# Patient Record
Sex: Male | Born: 1935 | Race: White | Hispanic: No | Marital: Married | Smoking: Former smoker
Health system: Southern US, Community
[De-identification: ages and names within clinical notes are randomized; demographics above are authoritative.]

## PROBLEM LIST (undated history)

## (undated) DIAGNOSIS — E785 Hyperlipidemia, unspecified: Secondary | ICD-10-CM

## (undated) DIAGNOSIS — M1990 Unspecified osteoarthritis, unspecified site: Secondary | ICD-10-CM

## (undated) DIAGNOSIS — I10 Essential (primary) hypertension: Secondary | ICD-10-CM

## (undated) DIAGNOSIS — L4050 Arthropathic psoriasis, unspecified: Secondary | ICD-10-CM

## (undated) DIAGNOSIS — I4891 Unspecified atrial fibrillation: Secondary | ICD-10-CM

## (undated) DIAGNOSIS — Z8719 Personal history of other diseases of the digestive system: Secondary | ICD-10-CM

## (undated) DIAGNOSIS — H269 Unspecified cataract: Secondary | ICD-10-CM

## (undated) DIAGNOSIS — T4145XA Adverse effect of unspecified anesthetic, initial encounter: Secondary | ICD-10-CM

## (undated) DIAGNOSIS — H409 Unspecified glaucoma: Secondary | ICD-10-CM

## (undated) DIAGNOSIS — J180 Bronchopneumonia, unspecified organism: Secondary | ICD-10-CM

## (undated) DIAGNOSIS — Z95810 Presence of automatic (implantable) cardiac defibrillator: Secondary | ICD-10-CM

## (undated) DIAGNOSIS — I495 Sick sinus syndrome: Secondary | ICD-10-CM

## (undated) DIAGNOSIS — K922 Gastrointestinal hemorrhage, unspecified: Secondary | ICD-10-CM

## (undated) DIAGNOSIS — K219 Gastro-esophageal reflux disease without esophagitis: Secondary | ICD-10-CM

## (undated) DIAGNOSIS — D509 Iron deficiency anemia, unspecified: Secondary | ICD-10-CM

## (undated) HISTORY — PX: TOTAL HIP ARTHROPLASTY: SHX124

## (undated) HISTORY — DX: Unspecified atrial fibrillation: I48.91

## (undated) HISTORY — DX: Iron deficiency anemia, unspecified: D50.9

## (undated) HISTORY — DX: Gastro-esophageal reflux disease without esophagitis: K21.9

## (undated) HISTORY — DX: Sick sinus syndrome: I49.5

## (undated) HISTORY — DX: Unspecified glaucoma: H40.9

## (undated) HISTORY — DX: Gastrointestinal hemorrhage, unspecified: K92.2

## (undated) HISTORY — PX: JOINT REPLACEMENT: SHX530

## (undated) HISTORY — PX: APPENDECTOMY: SHX54

## (undated) HISTORY — DX: Arthropathic psoriasis, unspecified: L40.50

## (undated) HISTORY — DX: Essential (primary) hypertension: I10

## (undated) HISTORY — DX: Unspecified cataract: H26.9

## (undated) HISTORY — PX: EYE SURGERY: SHX253

## (undated) HISTORY — DX: Hyperlipidemia, unspecified: E78.5

---

## 1959-05-01 HISTORY — PX: PILONIDAL CYST EXCISION: SHX744

## 1974-04-30 DIAGNOSIS — T8859XA Other complications of anesthesia, initial encounter: Secondary | ICD-10-CM

## 1974-04-30 HISTORY — DX: Other complications of anesthesia, initial encounter: T88.59XA

## 1976-04-30 HISTORY — PX: HAND SURGERY: SHX662

## 1999-10-18 ENCOUNTER — Encounter: Payer: Self-pay | Admitting: Orthopedic Surgery

## 1999-10-18 ENCOUNTER — Ambulatory Visit (HOSPITAL_COMMUNITY): Admission: RE | Admit: 1999-10-18 | Discharge: 1999-10-18 | Payer: Self-pay | Admitting: Orthopedic Surgery

## 2000-01-03 ENCOUNTER — Ambulatory Visit (HOSPITAL_COMMUNITY): Admission: RE | Admit: 2000-01-03 | Discharge: 2000-01-03 | Payer: Self-pay | Admitting: Orthopedic Surgery

## 2000-01-03 ENCOUNTER — Encounter: Payer: Self-pay | Admitting: Orthopedic Surgery

## 2000-02-29 ENCOUNTER — Encounter: Payer: Self-pay | Admitting: Orthopedic Surgery

## 2000-03-04 ENCOUNTER — Inpatient Hospital Stay (HOSPITAL_COMMUNITY): Admission: RE | Admit: 2000-03-04 | Discharge: 2000-03-07 | Payer: Self-pay | Admitting: Orthopedic Surgery

## 2000-03-04 ENCOUNTER — Encounter: Payer: Self-pay | Admitting: Orthopedic Surgery

## 2000-03-05 ENCOUNTER — Encounter: Payer: Self-pay | Admitting: Orthopedic Surgery

## 2000-12-28 ENCOUNTER — Emergency Department (HOSPITAL_COMMUNITY): Admission: EM | Admit: 2000-12-28 | Discharge: 2000-12-28 | Payer: Self-pay | Admitting: Emergency Medicine

## 2005-04-18 ENCOUNTER — Ambulatory Visit: Payer: Self-pay | Admitting: Gastroenterology

## 2005-05-07 ENCOUNTER — Ambulatory Visit: Payer: Self-pay | Admitting: Gastroenterology

## 2006-06-11 ENCOUNTER — Encounter: Admission: RE | Admit: 2006-06-11 | Discharge: 2006-06-11 | Payer: Self-pay | Admitting: Family Medicine

## 2006-06-12 ENCOUNTER — Encounter: Admission: RE | Admit: 2006-06-12 | Discharge: 2006-06-12 | Payer: Self-pay | Admitting: Family Medicine

## 2008-04-30 HISTORY — PX: INSERT / REPLACE / REMOVE PACEMAKER: SUR710

## 2008-08-23 LAB — PULMONARY FUNCTION TEST

## 2008-09-01 ENCOUNTER — Ambulatory Visit: Payer: Self-pay | Admitting: Internal Medicine

## 2008-09-01 DIAGNOSIS — L4050 Arthropathic psoriasis, unspecified: Secondary | ICD-10-CM | POA: Insufficient documentation

## 2008-09-01 DIAGNOSIS — I10 Essential (primary) hypertension: Secondary | ICD-10-CM | POA: Insufficient documentation

## 2008-09-03 ENCOUNTER — Ambulatory Visit: Payer: Self-pay

## 2008-09-03 ENCOUNTER — Encounter: Payer: Self-pay | Admitting: Internal Medicine

## 2008-09-07 ENCOUNTER — Ambulatory Visit: Payer: Self-pay

## 2008-09-07 ENCOUNTER — Ambulatory Visit: Payer: Self-pay | Admitting: Internal Medicine

## 2008-09-08 ENCOUNTER — Encounter (INDEPENDENT_AMBULATORY_CARE_PROVIDER_SITE_OTHER): Payer: Self-pay | Admitting: *Deleted

## 2008-09-08 ENCOUNTER — Encounter: Payer: Self-pay | Admitting: Cardiology

## 2008-09-08 ENCOUNTER — Telehealth: Payer: Self-pay | Admitting: Internal Medicine

## 2008-09-08 ENCOUNTER — Ambulatory Visit: Payer: Self-pay | Admitting: Internal Medicine

## 2008-09-08 LAB — CONVERTED CEMR LAB
Eosinophils Relative: 2.6 % (ref 0.0–5.0)
GFR calc non Af Amer: 101.4 mL/min (ref >60)
HCT: 46.7 % (ref 39.0–52.0)
Hemoglobin: 16 g/dL (ref 13.0–17.0)
Lymphocytes Relative: 18.2 % (ref 12.0–46.0)
Lymphs Abs: 1.2 10*3/uL (ref 0.7–4.0)
Magnesium: 2.2 mg/dL (ref 1.5–2.5)
Monocytes Relative: 8.5 % (ref 3.0–12.0)
Neutro Abs: 4.6 10*3/uL (ref 1.4–7.7)
Platelets: 212 10*3/uL (ref 150.0–400.0)
Potassium: 3.9 meq/L (ref 3.5–5.1)
Prothrombin Time: 11.4 s (ref 10.9–13.3)
Sodium: 143 meq/L (ref 135–145)
WBC: 6.8 10*3/uL (ref 4.5–10.5)
aPTT: 33.3 s — ABNORMAL HIGH (ref 21.7–28.8)

## 2008-09-10 ENCOUNTER — Inpatient Hospital Stay (HOSPITAL_BASED_OUTPATIENT_CLINIC_OR_DEPARTMENT_OTHER): Admission: RE | Admit: 2008-09-10 | Discharge: 2008-09-10 | Payer: Self-pay | Admitting: Cardiovascular Disease

## 2008-09-10 ENCOUNTER — Ambulatory Visit: Payer: Self-pay | Admitting: Cardiovascular Disease

## 2008-09-10 HISTORY — PX: CARDIAC CATHETERIZATION: SHX172

## 2008-09-13 ENCOUNTER — Ambulatory Visit: Payer: Self-pay | Admitting: Cardiovascular Disease

## 2008-09-15 ENCOUNTER — Inpatient Hospital Stay (HOSPITAL_COMMUNITY): Admission: RE | Admit: 2008-09-15 | Discharge: 2008-09-18 | Payer: Self-pay | Admitting: Cardiology

## 2008-09-15 ENCOUNTER — Encounter: Payer: Self-pay | Admitting: Cardiology

## 2008-09-15 ENCOUNTER — Ambulatory Visit: Payer: Self-pay | Admitting: Cardiology

## 2008-09-16 ENCOUNTER — Telehealth: Payer: Self-pay | Admitting: Internal Medicine

## 2008-09-18 ENCOUNTER — Encounter: Payer: Self-pay | Admitting: Cardiology

## 2008-09-20 ENCOUNTER — Encounter: Payer: Self-pay | Admitting: Internal Medicine

## 2008-09-22 ENCOUNTER — Ambulatory Visit: Payer: Self-pay | Admitting: Internal Medicine

## 2008-09-29 ENCOUNTER — Telehealth: Payer: Self-pay | Admitting: Internal Medicine

## 2008-09-29 ENCOUNTER — Encounter: Payer: Self-pay | Admitting: *Deleted

## 2008-09-30 ENCOUNTER — Ambulatory Visit: Payer: Self-pay | Admitting: Internal Medicine

## 2008-09-30 LAB — CONVERTED CEMR LAB: Protime: 18.7

## 2008-10-01 ENCOUNTER — Telehealth: Payer: Self-pay | Admitting: Internal Medicine

## 2008-10-01 LAB — CONVERTED CEMR LAB
BUN: 22 mg/dL (ref 6–23)
Calcium: 9.1 mg/dL (ref 8.4–10.5)
GFR calc non Af Amer: 70.2 mL/min (ref >60)
Potassium: 3.4 meq/L — ABNORMAL LOW (ref 3.5–5.1)

## 2008-10-05 ENCOUNTER — Ambulatory Visit: Payer: Self-pay | Admitting: Cardiology

## 2008-10-06 ENCOUNTER — Telehealth (INDEPENDENT_AMBULATORY_CARE_PROVIDER_SITE_OTHER): Payer: Self-pay | Admitting: *Deleted

## 2008-10-07 LAB — CONVERTED CEMR LAB
CO2: 29 meq/L (ref 19–32)
Calcium: 9.1 mg/dL (ref 8.4–10.5)
GFR calc non Af Amer: 88.49 mL/min (ref >60)
Pro B Natriuretic peptide (BNP): 86 pg/mL (ref 0.0–100.0)
Sodium: 143 meq/L (ref 135–145)

## 2008-10-21 ENCOUNTER — Ambulatory Visit: Payer: Self-pay | Admitting: Cardiology

## 2008-10-21 LAB — CONVERTED CEMR LAB
POC INR: 1.9
Prothrombin Time: 16.8 s

## 2008-10-26 ENCOUNTER — Ambulatory Visit: Payer: Self-pay | Admitting: Internal Medicine

## 2008-10-27 ENCOUNTER — Encounter: Payer: Self-pay | Admitting: Internal Medicine

## 2008-10-27 ENCOUNTER — Ambulatory Visit: Payer: Self-pay

## 2008-11-03 ENCOUNTER — Encounter: Payer: Self-pay | Admitting: *Deleted

## 2008-11-18 ENCOUNTER — Ambulatory Visit: Payer: Self-pay | Admitting: Cardiology

## 2008-11-18 LAB — CONVERTED CEMR LAB

## 2008-12-01 ENCOUNTER — Ambulatory Visit: Payer: Self-pay | Admitting: Internal Medicine

## 2008-12-02 ENCOUNTER — Ambulatory Visit: Payer: Self-pay | Admitting: Cardiology

## 2008-12-23 ENCOUNTER — Ambulatory Visit: Payer: Self-pay | Admitting: Cardiovascular Disease

## 2008-12-23 LAB — CONVERTED CEMR LAB: POC INR: 1.7

## 2009-01-06 ENCOUNTER — Ambulatory Visit: Payer: Self-pay | Admitting: Cardiology

## 2009-01-06 LAB — CONVERTED CEMR LAB: POC INR: 2.3

## 2009-01-26 ENCOUNTER — Ambulatory Visit: Payer: Self-pay

## 2009-01-26 ENCOUNTER — Encounter (INDEPENDENT_AMBULATORY_CARE_PROVIDER_SITE_OTHER): Payer: Self-pay | Admitting: *Deleted

## 2009-01-26 ENCOUNTER — Ambulatory Visit: Payer: Self-pay | Admitting: Internal Medicine

## 2009-01-26 ENCOUNTER — Encounter: Payer: Self-pay | Admitting: Internal Medicine

## 2009-02-03 ENCOUNTER — Ambulatory Visit: Payer: Self-pay | Admitting: Cardiology

## 2009-02-03 LAB — CONVERTED CEMR LAB: POC INR: 2.2

## 2009-03-03 ENCOUNTER — Ambulatory Visit: Payer: Self-pay | Admitting: Internal Medicine

## 2009-03-03 LAB — CONVERTED CEMR LAB: POC INR: 2

## 2009-03-23 ENCOUNTER — Ambulatory Visit: Payer: Self-pay | Admitting: Cardiology

## 2009-03-23 LAB — CONVERTED CEMR LAB: POC INR: 1.7

## 2009-04-13 ENCOUNTER — Ambulatory Visit: Payer: Self-pay | Admitting: Internal Medicine

## 2009-04-13 LAB — CONVERTED CEMR LAB: POC INR: 1.8

## 2009-05-04 ENCOUNTER — Ambulatory Visit: Payer: Self-pay | Admitting: Cardiology

## 2009-05-25 ENCOUNTER — Ambulatory Visit: Payer: Self-pay | Admitting: Cardiology

## 2009-06-15 ENCOUNTER — Ambulatory Visit: Payer: Self-pay | Admitting: Cardiology

## 2009-06-15 LAB — CONVERTED CEMR LAB: POC INR: 2.1

## 2009-07-13 ENCOUNTER — Ambulatory Visit: Payer: Self-pay | Admitting: Cardiology

## 2009-08-02 ENCOUNTER — Encounter: Payer: Self-pay | Admitting: Internal Medicine

## 2009-08-02 ENCOUNTER — Ambulatory Visit: Payer: Self-pay

## 2009-08-02 ENCOUNTER — Ambulatory Visit (HOSPITAL_COMMUNITY): Admission: RE | Admit: 2009-08-02 | Discharge: 2009-08-02 | Payer: Self-pay | Admitting: Internal Medicine

## 2009-08-02 ENCOUNTER — Ambulatory Visit: Payer: Self-pay | Admitting: Cardiology

## 2009-08-08 ENCOUNTER — Telehealth: Payer: Self-pay | Admitting: Internal Medicine

## 2009-08-10 ENCOUNTER — Ambulatory Visit: Payer: Self-pay | Admitting: Cardiovascular Disease

## 2009-08-24 ENCOUNTER — Encounter: Payer: Self-pay | Admitting: Internal Medicine

## 2009-08-24 LAB — PULMONARY FUNCTION TEST

## 2009-08-30 ENCOUNTER — Ambulatory Visit: Payer: Self-pay | Admitting: Internal Medicine

## 2009-09-01 ENCOUNTER — Encounter: Payer: Self-pay | Admitting: Internal Medicine

## 2009-09-06 ENCOUNTER — Encounter: Payer: Self-pay | Admitting: Internal Medicine

## 2009-09-07 ENCOUNTER — Ambulatory Visit: Payer: Self-pay | Admitting: Cardiology

## 2009-10-05 ENCOUNTER — Ambulatory Visit: Payer: Self-pay | Admitting: Cardiovascular Disease

## 2009-10-05 LAB — CONVERTED CEMR LAB: POC INR: 2.9

## 2009-11-02 ENCOUNTER — Ambulatory Visit: Payer: Self-pay | Admitting: Cardiology

## 2009-11-02 LAB — CONVERTED CEMR LAB: POC INR: 3.3

## 2009-11-23 ENCOUNTER — Ambulatory Visit: Payer: Self-pay | Admitting: Cardiology

## 2009-11-23 LAB — CONVERTED CEMR LAB: POC INR: 2.8

## 2009-12-21 ENCOUNTER — Ambulatory Visit: Payer: Self-pay | Admitting: Cardiology

## 2009-12-21 LAB — CONVERTED CEMR LAB: POC INR: 2.5

## 2010-01-18 ENCOUNTER — Ambulatory Visit: Payer: Self-pay | Admitting: Cardiology

## 2010-01-18 LAB — CONVERTED CEMR LAB: POC INR: 2.8

## 2010-02-15 ENCOUNTER — Ambulatory Visit: Payer: Self-pay | Admitting: Cardiology

## 2010-02-15 LAB — CONVERTED CEMR LAB: POC INR: 2.1

## 2010-03-01 ENCOUNTER — Encounter: Payer: Self-pay | Admitting: Internal Medicine

## 2010-03-01 ENCOUNTER — Ambulatory Visit: Payer: Self-pay

## 2010-03-02 ENCOUNTER — Telehealth (INDEPENDENT_AMBULATORY_CARE_PROVIDER_SITE_OTHER): Payer: Self-pay | Admitting: *Deleted

## 2010-03-06 ENCOUNTER — Ambulatory Visit: Payer: Self-pay | Admitting: Cardiovascular Disease

## 2010-04-04 ENCOUNTER — Ambulatory Visit: Payer: Self-pay | Admitting: Cardiology

## 2010-05-02 ENCOUNTER — Ambulatory Visit: Admission: RE | Admit: 2010-05-02 | Discharge: 2010-05-02 | Payer: Self-pay | Source: Home / Self Care

## 2010-05-02 LAB — CONVERTED CEMR LAB: POC INR: 1.9

## 2010-05-18 ENCOUNTER — Encounter: Payer: Self-pay | Admitting: Gastroenterology

## 2010-05-25 ENCOUNTER — Inpatient Hospital Stay (HOSPITAL_COMMUNITY)
Admission: EM | Admit: 2010-05-25 | Discharge: 2010-05-28 | Payer: Self-pay | Source: Home / Self Care | Attending: Internal Medicine | Admitting: Internal Medicine

## 2010-05-25 DIAGNOSIS — K5731 Diverticulosis of large intestine without perforation or abscess with bleeding: Secondary | ICD-10-CM

## 2010-05-25 DIAGNOSIS — D684 Acquired coagulation factor deficiency: Secondary | ICD-10-CM

## 2010-05-25 DIAGNOSIS — I4891 Unspecified atrial fibrillation: Secondary | ICD-10-CM

## 2010-05-25 DIAGNOSIS — R578 Other shock: Secondary | ICD-10-CM

## 2010-05-25 LAB — CBC
HCT: 34.3 % — ABNORMAL LOW (ref 39.0–52.0)
HCT: 26.4 % — ABNORMAL LOW (ref 39.0–52.0)
HCT: 18.8 % — ABNORMAL LOW (ref 39.0–52.0)
Hemoglobin: 11.8 g/dL — ABNORMAL LOW (ref 13.0–17.0)
Hemoglobin: 6.4 g/dL — CL (ref 13.0–17.0)
MCH: 31.9 pg (ref 26.0–34.0)
MCH: 31.2 pg (ref 26.0–34.0)
MCHC: 34.4 g/dL (ref 30.0–36.0)
MCHC: 34 g/dL (ref 30.0–36.0)
MCV: 92.7 fL (ref 78.0–100.0)
MCV: 92.3 fL (ref 78.0–100.0)
MCV: 91.7 fL (ref 78.0–100.0)
Platelets: 171 K/uL (ref 150–400)
Platelets: 142 K/uL — ABNORMAL LOW (ref 150–400)
RBC: 3.7 MIL/uL — ABNORMAL LOW (ref 4.22–5.81)
RBC: 2.05 MIL/uL — ABNORMAL LOW (ref 4.22–5.81)
RDW: 13.7 % (ref 11.5–15.5)
RDW: 13.9 % (ref 11.5–15.5)
RDW: 14 % (ref 11.5–15.5)
WBC: 8.2 K/uL (ref 4.0–10.5)
WBC: 5.9 K/uL (ref 4.0–10.5)

## 2010-05-25 LAB — COMPREHENSIVE METABOLIC PANEL WITH GFR
ALT: 26 U/L (ref 0–53)
AST: 29 U/L (ref 0–37)
Albumin: 3 g/dL — ABNORMAL LOW (ref 3.5–5.2)
Alkaline Phosphatase: 63 U/L (ref 39–117)
BUN: 14 mg/dL (ref 6–23)
CO2: 24 meq/L (ref 19–32)
Calcium: 8.5 mg/dL (ref 8.4–10.5)
Chloride: 113 meq/L — ABNORMAL HIGH (ref 96–112)
Creatinine, Ser: 1.02 mg/dL (ref 0.4–1.5)
GFR calc non Af Amer: >60 mL/min
Glucose, Bld: 118 mg/dL — ABNORMAL HIGH (ref 70–99)
Potassium: 4.3 meq/L (ref 3.5–5.1)
Sodium: 144 meq/L (ref 135–145)
Total Bilirubin: 0.7 mg/dL (ref 0.3–1.2)
Total Protein: 5.6 g/dL — ABNORMAL LOW (ref 6.0–8.3)

## 2010-05-25 LAB — DIFFERENTIAL
Basophils Absolute: 0.1 K/uL (ref 0.0–0.1)
Basophils Relative: 1 % (ref 0–1)
Eosinophils Absolute: 0.1 K/uL (ref 0.0–0.7)
Eosinophils Relative: 2 % (ref 0–5)
Lymphocytes Relative: 14 % (ref 12–46)
Lymphs Abs: 1.1 K/uL (ref 0.7–4.0)
Monocytes Absolute: 0.6 K/uL (ref 0.1–1.0)
Monocytes Relative: 8 % (ref 3–12)
Neutro Abs: 6.2 K/uL (ref 1.7–7.7)
Neutrophils Relative %: 76 % (ref 43–77)

## 2010-05-25 LAB — APTT

## 2010-05-25 LAB — CK TOTAL AND CKMB (NOT AT ARMC)
CK, MB: 2.7 ng/mL (ref 0.3–4.0)
Relative Index: INVALID (ref 0.0–2.5)
Total CK: 65 U/L (ref 7–232)

## 2010-05-25 LAB — HEPATIC FUNCTION PANEL
Albumin: 2.4 g/dL — ABNORMAL LOW (ref 3.5–5.2)
Alkaline Phosphatase: 41 U/L (ref 39–117)

## 2010-05-25 LAB — PROTIME-INR
INR: 3.59 — ABNORMAL HIGH (ref 0.00–1.49)
INR: 1.62 — ABNORMAL HIGH (ref 0.00–1.49)
Prothrombin Time: 35.8 s — ABNORMAL HIGH (ref 11.6–15.2)
Prothrombin Time: 19.4 s — ABNORMAL HIGH (ref 11.6–15.2)

## 2010-05-25 LAB — BASIC METABOLIC PANEL
BUN: 14 mg/dL (ref 6–23)
CO2: 24 mEq/L (ref 19–32)
Calcium: 7.3 mg/dL — ABNORMAL LOW (ref 8.4–10.5)
GFR calc Af Amer: >60 mL/min (ref >60)
Glucose, Bld: 150 mg/dL — ABNORMAL HIGH (ref 70–99)
Potassium: 3.6 mEq/L (ref 3.5–5.1)

## 2010-05-25 LAB — CARDIAC PANEL(CRET KIN+CKTOT+MB+TROPI)
CK, MB: 2.2 ng/mL (ref 0.3–4.0)
Relative Index: INVALID (ref 0.0–2.5)
Total CK: 76 U/L (ref 7–232)

## 2010-05-25 LAB — PREPARE RBC (CROSSMATCH)

## 2010-05-25 LAB — LACTIC ACID, PLASMA
Lactic Acid, Venous: 1.7 mmol/L (ref 0.5–2.2)
Lactic Acid, Venous: 1.6 mmol/L (ref 0.5–2.2)

## 2010-05-25 LAB — TROPONIN I

## 2010-05-25 LAB — OCCULT BLOOD, POC DEVICE: Fecal Occult Bld: POSITIVE

## 2010-05-26 LAB — CARDIAC PANEL(CRET KIN+CKTOT+MB+TROPI)
CK, MB: 1.6 ng/mL (ref 0.3–4.0)
Total CK: 78 U/L (ref 7–232)

## 2010-05-26 LAB — CBC
HCT: 23.1 % — ABNORMAL LOW (ref 39.0–52.0)
HCT: 22.3 % — ABNORMAL LOW (ref 39.0–52.0)
Hemoglobin: 7.7 g/dL — ABNORMAL LOW (ref 13.0–17.0)
MCH: 31.3 pg (ref 26.0–34.0)
MCH: 31.3 pg (ref 26.0–34.0)
MCHC: 35 g/dL (ref 30.0–36.0)
MCV: 90.2 fL (ref 78.0–100.0)
Platelets: 138 10*3/uL — ABNORMAL LOW (ref 150–400)
Platelets: 94 10*3/uL — ABNORMAL LOW (ref 150–400)
RBC: 2.47 MIL/uL — ABNORMAL LOW (ref 4.22–5.81)
RBC: 2.84 MIL/uL — ABNORMAL LOW (ref 4.22–5.81)
RDW: 14.1 % (ref 11.5–15.5)
WBC: 6.9 10*3/uL (ref 4.0–10.5)
WBC: 6.4 10*3/uL (ref 4.0–10.5)

## 2010-05-26 LAB — APTT: aPTT: 33 seconds (ref 24–37)

## 2010-05-26 LAB — PREPARE FRESH FROZEN PLASMA: Unit division: 0

## 2010-05-26 LAB — BASIC METABOLIC PANEL
GFR calc non Af Amer: >60 mL/min (ref >60)
Glucose, Bld: 130 mg/dL — ABNORMAL HIGH (ref 70–99)
Potassium: 3.1 mEq/L — ABNORMAL LOW (ref 3.5–5.1)
Sodium: 143 mEq/L (ref 135–145)

## 2010-05-27 ENCOUNTER — Encounter: Payer: Self-pay | Admitting: Gastroenterology

## 2010-05-27 HISTORY — PX: ESOPHAGOGASTRODUODENOSCOPY: SHX1529

## 2010-05-27 HISTORY — PX: COLONOSCOPY: SHX174

## 2010-05-27 LAB — PROTIME-INR
INR: 1.19 (ref 0.00–1.49)
Prothrombin Time: 15.3 seconds — ABNORMAL HIGH (ref 11.6–15.2)

## 2010-05-27 LAB — TYPE AND SCREEN
ABO/RH(D): O POS
Antibody Screen: NEGATIVE
Unit division: 0
Unit division: 0
Unit division: 0
Unit division: 0
Unit division: 0
Unit division: 0

## 2010-05-27 LAB — CBC
HCT: 25.7 % — ABNORMAL LOW (ref 39.0–52.0)
Platelets: 96 10*3/uL — ABNORMAL LOW (ref 150–400)
RDW: 14.5 % (ref 11.5–15.5)
WBC: 4.7 10*3/uL (ref 4.0–10.5)

## 2010-05-27 LAB — BASIC METABOLIC PANEL
Calcium: 8.2 mg/dL — ABNORMAL LOW (ref 8.4–10.5)
Creatinine, Ser: 0.72 mg/dL (ref 0.4–1.5)
GFR calc Af Amer: >60 mL/min (ref >60)

## 2010-05-28 LAB — CBC
HCT: 24.4 % — ABNORMAL LOW (ref 39.0–52.0)
MCH: 30.8 pg (ref 26.0–34.0)
MCV: 91.7 fL (ref 78.0–100.0)
Platelets: 106 10*3/uL — ABNORMAL LOW (ref 150–400)
RDW: 14.7 % (ref 11.5–15.5)

## 2010-05-30 ENCOUNTER — Ambulatory Visit: Admit: 2010-05-30 | Payer: Self-pay

## 2010-05-30 NOTE — Medication Information (Signed)
Summary: rov/tm  Anticoagulant Therapy  Managed by: Weston Brass, PharmD PCP: Ivin Poot Supervising MD: Shirlee Latch MD, Rayona Sardinha Indication 1: Atrial Fibrillation Lab Used: [facility] [place] [place] Site: [address] INR POC 3.3 INR RANGE 2.0-3.0  Dietary changes: no    Health status changes: no    Bleeding/hemorrhagic complications: no    Recent/future hospitalizations: no    Any changes in medication regimen? no    Recent/future dental: no  Any missed doses?: no       Is patient compliant with meds? yes       Allergies: No Known Drug Allergies  Anticoagulation Management History:      The patient is taking warfarin and comes in today for a routine follow up visit.  Positive risk factors for bleeding include an age of 75 years or older.  Negative risk factors for bleeding include no history of CVA/TIA.  The bleeding index is 'intermediate risk'.  Positive CHADS2 values include History of HTN.  Negative CHADS2 values include Age > 63 years old, History of Diabetes, and Prior Stroke/CVA/TIA.  His last INR was 1.1 ratio.  Anticoagulation responsible provider: Shirlee Latch MD, Brailon Don.  INR POC: 3.3.  Cuvette Lot#: [id number].  Exp: 12/2010.    Anticoagulation Management Assessment/Plan:      The patient's current anticoagulation dose is Coumadin 5 mg tabs: Take as directed by coumadin clinic..  The target INR is 2 - 3.  The next INR is due 11/23/2009.  Anticoagulation instructions were given to patient.  Results were reviewed/authorized by Weston Brass, PharmD.  He was notified by Weston Brass PharmD.         Prior Anticoagulation Instructions: INR 2.9 Continue 7.5mg s daily except 5mg s on Saturdays. Recheck in 4 weeks.   Current Anticoagulation Instructions: INR 3.3  Skip today's dose of Coumadin then resume same dose of 1 1/2 tablets every day except 1 tablet on Saturday.

## 2010-05-30 NOTE — Medication Information (Signed)
Summary: starting on Doxy x 10 days on 03/02/10/ewj  Anticoagulant Therapy  Managed by: Weston Brass, PharmD PCP: Ivin Poot Supervising MD: Nahser Indication 1: Atrial Fibrillation Lab Used: [facility] [place] Site: [address] INR POC 2.5 INR RANGE 2.0-3.0  Dietary changes: no    Health status changes: no    Bleeding/hemorrhagic complications: no    Recent/future hospitalizations: no    Any changes in medication regimen? yes       Details: on doxycycline for 7 days.   Recent/future dental: no  Any missed doses?: no       Is patient compliant with meds? yes       Allergies: No Known Drug Allergies  Anticoagulation Management History:      The patient is taking warfarin and comes in today for a routine follow up visit.  Positive risk factors for bleeding include an age of 75 years or older.  Negative risk factors for bleeding include no history of CVA/TIA.  The bleeding index is 'intermediate risk'.  Positive CHADS2 values include History of HTN.  Negative CHADS2 values include Age > 73 years old, History of Diabetes, and Prior Stroke/CVA/TIA.  The start date was 09/13/2008.  His last INR was 1.1 ratio.  Anticoagulation responsible provider: Nahser.  INR POC: 2.5.  Cuvette Lot#: [id number].  Exp: 03/2011.    Anticoagulation Management Assessment/Plan:      The patient's current anticoagulation dose is Coumadin 5 mg tabs: Take as directed by coumadin clinic., Warfarin sodium 5 mg tabs: Use as directed by Anticoagulation Clinic.  The target INR is 2 - 3.  The next INR is due 04/03/2010.  Anticoagulation instructions were given to patient.  Results were reviewed/authorized by Weston Brass, PharmD.  He was notified by Weston Brass PharmD.         Prior Anticoagulation Instructions: INR 2.1  Continue Coumadin as scheduled:  1 and 1/2 tablets every day of the week, except 1 tablet on Saturday.    Current Anticoagulation Instructions: INR 2.5  Continue same dose of  1 1/2 tablets every day except 1 tablet on Saturday.

## 2010-05-30 NOTE — Medication Information (Signed)
Summary: rov/sl  Anticoagulant Therapy  Managed by: Weston Brass, PharmD PCP: Ivin Poot Supervising MD: Shirlee Latch MD, Stacie Templin Indication 1: Atrial Fibrillation Lab Used: [facility] [place] Site: [address] INR POC 2.8 INR RANGE 2.0-3.0  Dietary changes: no    Health status changes: no    Bleeding/hemorrhagic complications: no    Recent/future hospitalizations: no    Any changes in medication regimen? no    Recent/future dental: no  Any missed doses?: no       Is patient compliant with meds? yes       Allergies: No Known Drug Allergies  Anticoagulation Management History:      The patient is taking warfarin and comes in today for a routine follow up visit.  Positive risk factors for bleeding include an age of 75 years or older.  Negative risk factors for bleeding include no history of CVA/TIA.  The bleeding index is 'intermediate risk'.  Positive CHADS2 values include History of HTN.  Negative CHADS2 values include Age > 6 years old, History of Diabetes, and Prior Stroke/CVA/TIA.  The start date was 09/13/2008.  His last INR was 1.1 ratio.  Anticoagulation responsible provider: Shirlee Latch MD, Nilton Lave.  INR POC: 2.8.  Cuvette Lot#: [id number].  Exp: 03/2011.    Anticoagulation Management Assessment/Plan:      The patient's current anticoagulation dose is Coumadin 5 mg tabs: Take as directed by coumadin clinic., Warfarin sodium 5 mg tabs: Use as directed by Anticoagulation Clinic.  The target INR is 2 - 3.  The next INR is due 02/15/2010.  Anticoagulation instructions were given to patient.  Results were reviewed/authorized by Weston Brass, PharmD.  He was notified by Weston Brass PharmD.         Prior Anticoagulation Instructions: INR 2.5  Continue taking Coumadin 1.5 tabs (7.5 mg) on all days except for Coumadin 1 tab (5 mg) on Saturdays.  Return to clinic in 4 weeks.   Current Anticoagulation Instructions: INR 2.8  Continue same dose of 1 1/2 tablets every day  except 1 tablet on Saturday.  Recheck INR in 4 weeks.

## 2010-05-30 NOTE — Medication Information (Signed)
Summary: rov/eac  Anticoagulant Therapy  Managed by: Bethena Midget, RN, BSN PCP: Ivin Poot Supervising MD: Eden Emms MD, Theron Arista Indication 1: Atrial Fibrillation Lab Used: [facility] [place] Site: [address] INR POC 2.9 INR RANGE 2.0-3.0  Dietary changes: no    Health status changes: no    Bleeding/hemorrhagic complications: no    Recent/future hospitalizations: no    Any changes in medication regimen? no    Recent/future dental: no  Any missed doses?: no       Is patient compliant with meds? yes       Allergies: No Known Drug Allergies  Anticoagulation Management History:      The patient is taking warfarin and comes in today for a routine follow up visit.  Positive risk factors for bleeding include an age of 75 years or older.  Negative risk factors for bleeding include no history of CVA/TIA.  The bleeding index is 'intermediate risk'.  Positive CHADS2 values include History of HTN.  Negative CHADS2 values include Age > 19 years old, History of Diabetes, and Prior Stroke/CVA/TIA.  His last INR was 1.1 ratio.  Anticoagulation responsible provider: Eden Emms MD, Theron Arista.  INR POC: 2.9.  Cuvette Lot#: [id number].  Exp: 11/2010.    Anticoagulation Management Assessment/Plan:      The patient's current anticoagulation dose is Coumadin 5 mg tabs: Take as directed by coumadin clinic..  The target INR is 2 - 3.  The next INR is due 11/02/2009.  Anticoagulation instructions were given to patient.  Results were reviewed/authorized by Bethena Midget, RN, BSN.  He was notified by Bethena Midget, RN, BSN.         Prior Anticoagulation Instructions: INR 2.4  Continue taking 1 tablet on Saturday, and 1.5 tablets all other days.  Return to clinic in 4 weeks.      Current Anticoagulation Instructions: INR 2.9 Continue 7.5mg s daily except 5mg s on Saturdays. Recheck in 4 weeks.

## 2010-05-30 NOTE — Assessment & Plan Note (Signed)
Summary: 6 MO F/'U   Current Medications (verified): 1)  Lisinopril-Hydrochlorothiazide 20-12.5 Mg Tabs (Lisinopril-Hydrochlorothiazide) .... Take One Tablet Twice Daily 2)  Dovonex 0.005 % Crea (Calcipotriene) .... Twice Daily 3)  Multivitamins   Tabs (Multiple Vitamin) .Marland Kitchen.. 1 By Mouth Once Daily 4)  Fish Oil 1000 Mg Caps (Omega-3 Fatty Acids) .... 2 Caps One  Time A Day 5)  Coreg 25 Mg Tabs (Carvedilol) .... Take One Table Twice Daily 6)  Aleve 220 Mg Tabs (Naproxen Sodium) .Marland Kitchen.. 1 Tab Two Times A Day 7)  Lutein 20 Mg Caps (Lutein) .Marland Kitchen.. 1 Cap Once Daily 8)  Coumadin 5 Mg Tabs (Warfarin Sodium) .... Take As Directed By Coumadin Clinic. 9)  Potassium Chloride Crys Cr 20 Meq Cr-Tabs (Potassium Chloride Crys Cr) .... Take One Tablet By Mouth Daily 10)  Crestor 10 Mg Tabs (Rosuvastatin Calcium) .Marland Kitchen.. 1 By Mouth Daily 11)  Ala Cort 1 % Crea (Hydrocortisone) .... As Needed 12)  Warfarin Sodium 5 Mg Tabs (Warfarin Sodium) .... Use As Directed By Anticoagulation Clinic 13)  Amlodipine Besylate 10 Mg Tabs (Amlodipine Besylate) .... Take 1 Tablet By Mouth Once A Day 14)  Symbicort 160-4.5 Mcg/act Aero (Budesonide-Formoterol Fumarate) .... As Needed  Allergies (verified): No Known Drug Allergies   PPM Specifications Following MD:  Sherryl Manges, MD     PPM Vendor:  [employer] Jude     PPM Model Number:  [id number]     PPM Serial Number:  [id number] PPM DOI:  09/17/2008     PPM Implanting MD:  Lewayne Bunting, MD  Lead 1    Location: [facility]     DOI: 09/17/2008     Model #: [id number]     Serial #: [id number]     Status: active Lead 2    Location: [facility]     DOI: 09/17/2008     Model #: [id number]     Serial #: [id number]     Status: active  Magnet Response Rate:  BOL 100 ERI 85    PPM Follow Up Remote Check?  No Battery Voltage:  2.98 V     Battery Est. Longevity:  9.9 years     Pacer Dependent:  Yes       PPM Device Measurements Atrium  Amplitude: 3.0 mV, Impedance: 410 ohms, Threshold: 0.625 V at 0.4 msec Right Ventricle   Amplitude: 12 mV, Impedance: 610 ohms, Threshold: 0.875 V at 0.4 msec  Episodes MS Episodes:  [id number]     Percent Mode Switch:  1.3%     Coumadin:  Yes Atrial Pacing:  79%     Ventricular Pacing:  28%  Parameters Mode:  DDDR     Lower Rate Limit:  60     Upper Rate Limit:  90 Paced AV Delay:  300     Sensed AV Delay:  275 Next Remote Date:  06/01/2010     Next Cardiology Appt Due:  08/29/2010 Tech Comments:  Ventricular autocapture on.  The longest mode switch 8:26 minutes.  Merlin transmissions every 3 months.  ROV 5/12 with Dr. Ladona Ridgel. Altha Harm, LPN  March 01, 2010 11:34 AM

## 2010-05-30 NOTE — Medication Information (Signed)
Summary: rov/sp  Anticoagulant Therapy  Managed by: Reina Fuse, PharmD PCP: Ivin Poot Supervising MD: Shirlee Latch MD, Dalton Indication 1: Atrial Fibrillation Lab Used: [facility] [place] Site: [address] INR POC 2.5 INR RANGE 2.0-3.0  Dietary changes: no    Health status changes: no    Bleeding/hemorrhagic complications: no    Recent/future hospitalizations: no    Any changes in medication regimen? no    Recent/future dental: no  Any missed doses?: no       Is patient compliant with meds? yes       Allergies: No Known Drug Allergies  Anticoagulation Management History:      The patient is taking warfarin and comes in today for a routine follow up visit.  Positive risk factors for bleeding include an age of 75 years or older.  Negative risk factors for bleeding include no history of CVA/TIA.  The bleeding index is 'intermediate risk'.  Positive CHADS2 values include History of HTN.  Negative CHADS2 values include Age > 11 years old, History of Diabetes, and Prior Stroke/CVA/TIA.  The start date was 09/13/2008.  His last INR was 1.1 ratio.  Anticoagulation responsible provider: Shirlee Latch MD, Dalton.  INR POC: 2.5.  Cuvette Lot#: [id number].  Exp: 01/2011.    Anticoagulation Management Assessment/Plan:      The patient's current anticoagulation dose is Coumadin 5 mg tabs: Take as directed by coumadin clinic., Warfarin sodium 5 mg tabs: Use as directed by Anticoagulation Clinic.  The target INR is 2 - 3.  The next INR is due 01/18/2010.  Anticoagulation instructions were given to patient.  Results were reviewed/authorized by Reina Fuse, PharmD.  He was notified by Reina Fuse PharmD.         Prior Anticoagulation Instructions: INR 2.8  Continue same dose of 1 1/2 tablets every day except 1 tablet on Saturday.  Recheck INR in 4 weeks.   Current Anticoagulation Instructions: INR 2.5  Continue taking Coumadin 1.5 tabs (7.5 mg) on all days except for Coumadin 1 tab  (5 mg) on Saturdays.  Return to clinic in 4 weeks.

## 2010-05-30 NOTE — Cardiovascular Report (Signed)
Summary: Office Visit   Office Visit   Imported By: Roderic Ovens 03/15/2010 16:49:09  _____________________________________________________________________  External Attachment:    Type:   Image     Comment:   External Document

## 2010-05-30 NOTE — Medication Information (Signed)
Summary: rov/cs  Anticoagulant Therapy  Managed by: Weston Brass, PharmD PCP: Ivin Poot Supervising MD: Patty Sermons Indication 1: Atrial Fibrillation Lab Used: [facility] [place] Site: [address] INR POC 2.1 INR RANGE 2.0-3.0  Dietary changes: no    Health status changes: no    Bleeding/hemorrhagic complications: no    Recent/future hospitalizations: no    Any changes in medication regimen? no    Recent/future dental: no  Any missed doses?: no       Is patient compliant with meds? yes       Allergies: No Known Drug Allergies  Anticoagulation Management History:      The patient is taking warfarin and comes in today for a routine follow up visit.  Positive risk factors for bleeding include an age of 75 years or older.  Negative risk factors for bleeding include no history of CVA/TIA.  The bleeding index is 'intermediate risk'.  Positive CHADS2 values include History of HTN.  Negative CHADS2 values include Age > 73 years old, History of Diabetes, and Prior Stroke/CVA/TIA.  The start date was 09/13/2008.  His last INR was 1.1 ratio.  Anticoagulation responsible provider: [place].  INR POC: 2.1.  Cuvette Lot#: [id number].  Exp: 01/2011.    Anticoagulation Management Assessment/Plan:      The patient's current anticoagulation dose is Coumadin 5 mg tabs: Take as directed by coumadin clinic., Warfarin sodium 5 mg tabs: Use as directed by Anticoagulation Clinic.  The target INR is 2 - 3.  The next INR is due 05/02/2010.  Anticoagulation instructions were given to patient.  Results were reviewed/authorized by Weston Brass, PharmD.  He was notified by Weston Brass PharmD.         Prior Anticoagulation Instructions: INR 2.5  Continue same dose of 1 1/2 tablets every day except 1 tablet on Saturday.   Current Anticoagulation Instructions: INR 2.1  Continue same dose of 1 1/2 tablets every day except 1 tablet on Saturday.  Recheck INR in 4 weeks.

## 2010-05-30 NOTE — Letter (Signed)
Summary: [facility] Letter/Annual Physicial Office Note  [facility] Letter/Annual Physicial Office Note   Imported By: Roderic Ovens 10/12/2009 14:12:42  _____________________________________________________________________  External Attachment:    Type:   Image     Comment:   External Document

## 2010-05-30 NOTE — Cardiovascular Report (Signed)
Summary: Office Visit   Office Visit   Imported By: Roderic Ovens 08/30/2009 16:36:46  _____________________________________________________________________  External Attachment:    Type:   Image     Comment:   External Document

## 2010-05-30 NOTE — Medication Information (Signed)
Summary: rov/ewj  Anticoagulant Therapy  Managed by: Cloyde Reams, RN, BSN PCP: Ivin Poot Supervising MD: Jens Som MD, Arlys John Indication 1: Atrial Fibrillation Lab Used: [facility] [place] Site: [address] INR POC 1.9 INR RANGE 2.0-3.0  Dietary changes: no    Health status changes: no    Bleeding/hemorrhagic complications: no    Recent/future hospitalizations: no    Any changes in medication regimen? no    Recent/future dental: no  Any missed doses?: no       Is patient compliant with meds? yes       Allergies (verified): No Known Drug Allergies  Anticoagulation Management History:      The patient is taking warfarin and comes in today for a routine follow up visit.  Positive risk factors for bleeding include an age of 75 years or older.  Negative risk factors for bleeding include no history of CVA/TIA.  The bleeding index is 'intermediate risk'.  Positive CHADS2 values include History of HTN.  Negative CHADS2 values include Age > 73 years old, History of Diabetes, and Prior Stroke/CVA/TIA.  His last INR was 1.1 ratio.  Anticoagulation responsible provider: Jens Som MD, Arlys John.  INR POC: 1.9.  Cuvette Lot#: [id number].  Exp: 05/2010.    Anticoagulation Management Assessment/Plan:      The patient's current anticoagulation dose is Coumadin 5 mg tabs: Take as directed by coumadin clinic..  The target INR is 2 - 3.  The next INR is due 06/15/2009.  Anticoagulation instructions were given to patient.  Results were reviewed/authorized by Cloyde Reams, RN, BSN.  He was notified by Cloyde Reams RN.         Prior Anticoagulation Instructions: INR 1.8  Take 2 tablets today then start taking 1.5 tablets daily except 1 tablet on Tuesdays, Thursdays, and Saturdays.  Recheck in 3 weeks.    Current Anticoagulation Instructions: INR 1.9  Start taking 1.5 tablets daily except 1 tablet on Tuesdays and Saturdays.  Recheck in 3 weeks.

## 2010-05-30 NOTE — Assessment & Plan Note (Signed)
Summary: DEVICE/SAF   Primary Provider:  Dr.James Kindl  CC:  device check..  History of Present Illness: [name] is seen in followup for atrial fibrillation associated with a rapid ventricular response. He was found to have a significant cardiomyopathy with an ejection fraction of 20%. He underwent cardioversion with subsequent pacemaker implantation because of sinus bradycardia.  repeat ultrasound last month demonstrated a stable ejection fraction of 40-45%.  His major complaint is orthostatic lightheadedness. It sometimes causes and have to stop; he has never passed out with it. He also has complaints related to his pain in his feet. It is accompanied by some arthritis.  He comes in today feeling quite well. He has no complaints of chest pain shortness of breath or palpitations. He is tolerating his medications.  Current Medications (verified): 1)  Lisinopril-Hydrochlorothiazide 20-12.5 Mg Tabs (Lisinopril-Hydrochlorothiazide) .... Take One Tablet Twice Daily 2)  Dovonex 0.005 % Crea (Calcipotriene) .... Twice Daily 3)  Multivitamins   Tabs (Multiple Vitamin) .Marland Kitchen.. 1 By Mouth Once Daily 4)  Fish Oil 1000 Mg Caps (Omega-3 Fatty Acids) .... 2 Caps One  Time A Day 5)  Coreg 25 Mg Tabs (Carvedilol) .... Take One Table Twice Daily 6)  Aleve 220 Mg Tabs (Naproxen Sodium) .Marland Kitchen.. 1 Tab Two Times A Day 7)  Lutein 20 Mg Caps (Lutein) .Marland Kitchen.. 1 Cap Once Daily 8)  Coumadin 5 Mg Tabs (Warfarin Sodium) .... Take As Directed By Coumadin Clinic. 9)  Potassium Chloride Crys Cr 20 Meq Cr-Tabs (Potassium Chloride Crys Cr) .... Take One Tablet By Mouth Daily 10)  Crestor 10 Mg Tabs (Rosuvastatin Calcium) .Marland Kitchen.. 1 By Mouth Daily 11)  Ala Cort 1 % Crea (Hydrocortisone) .... As Needed 12)  Amlodipine Besylate 10 Mg Tabs (Amlodipine Besylate) .... Take 1 Tablet By Mouth Once A Day 13)  Symbicort 160-4.5 Mcg/act Aero (Budesonide-Formoterol Fumarate) .... As Needed  Allergies (verified): No Known Drug  Allergies  Past History:  Past Medical History: Last updated: 10/05/2008 Psoriatic arthritis GE reflux disease hypertension afib  Past Surgical History: Last updated: 10/05/2008 Bilateral hip replacements Left hand joint surgery Appendectomy  Family History: Last updated: 10/05/2008 Family History of Hypertension:  Family History of Coronary Artery Disease  Social History: Last updated: 10/05/2008 Full Time Married  Tobacco Use - Former.  Alcohol Use - yes Regular Exercise - no  Vital Signs:  Patient profile:   75 year old male Height:      69 inches Weight:      204 pounds BMI:     30.23 Pulse rate:   68 / minute Pulse rhythm:   regular BP sitting:   118 / 80  (left arm) Cuff size:   regular  Vitals Entered By: Judithe Modest CMA (Aug 30, 2009 9:23 AM)  Physical Exam  General:  The patient was alert and oriented in no acute distress. HEENT Normal.  Neck veins were flat, carotids were brisk.  Lungs were clear.  Heart sounds were regular without murmurs or gallops.  Abdomen was soft with active bowel sounds. There is no clubbing cyanosis or edema. Skin Warm and dry    PPM Specifications Following MD:  Sherryl Manges, MD     PPM Vendor:  [employer] Jude     PPM Model Number:  [id number]     PPM Serial Number:  [id number] PPM DOI:  09/17/2008     PPM Implanting MD:  Lewayne Bunting, MD  Lead 1    Location: [facility]     DOI: 09/17/2008  Model #: [id number]     Serial #: [id number]     Status: active Lead 2    Location: [facility]     DOI: 09/17/2008     Model #: [id number]     Serial #: [id number]     Status: active  Magnet Response Rate:  BOL 100 ERI 85    PPM Follow Up Remote Check?  No Battery Voltage:  2.98 V     Battery Est. Longevity:  9.8-10.87YRS     Pacer Dependent:  Yes       PPM Device Measurements Atrium  Amplitude: 4.0 mV, Impedance: 460 ohms, Threshold: 1.25 V at 0.4 msec Right Ventricle  Amplitude: 12.0 mV, Impedance: 550 ohms, Threshold: 1.0 V at 0.4 msec  Episodes MS  Episodes:  10,440     Percent Mode Switch:  <1%     Coumadin:  Yes Ventricular High Rate:  0     Atrial Pacing:  89%     Ventricular Pacing:  15%  Parameters Mode:  DDDR     Lower Rate Limit:  60     Upper Rate Limit:  90 Paced AV Delay:  300     Sensed AV Delay:  275 Next Remote Date:  12/01/2009     Tech Comments:  LONGEST AMS EPISODE WAS 3 MINUTES 50 SECONDS. + COUMADIN.  NORMAL DEVICE FUNCTION.   NO CHANGES MADE. [facility] CHECK IN [place].  Vella Kohler AMS episodes confirm non sustained atrial fib  Impression & Recommendations:  Problem # 1:  ATRIAL FIBRILLATION PAROXYSMAL (ICD-427.31) recurrent brief episodes of atrial fibrillation are noted. 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 Take one tablet twice daily    Coreg 25 Mg Tabs (Carvedilol) .Marland Kitchen... Take one table twice daily    Coumadin 5 Mg Tabs (Warfarin sodium) .Marland Kitchen... Take as directed by coumadin clinic.    Amlodipine Besylate 10 Mg Tabs (Amlodipine besylate) .Marland Kitchen... Take 1 tablet by mouth once a day  Problem # 3:  ORTHOSTATIC LIGHTHEADEDNESS (ICD-458.0) We have instructed him in maneuvers to try to mitigate some of his orthostasis  Problem # 4:  PACEMAKER,DDD STJ (ICD-V45.01) Device parameters and data were reviewed and no changes were made  Problem # 5:  SINUS NODE DYSFUNCTION (ICD-427.81)  stable post pacemaker  His updated medication list for this problem includes:    Lisinopril-hydrochlorothiazide 20-12.5 Mg Tabs (Lisinopril-hydrochlorothiazide) .Marland Kitchen... Take one tablet twice daily    Coreg 25 Mg Tabs (Carvedilol) .Marland Kitchen... Take one table twice daily    Coumadin 5 Mg Tabs (Warfarin sodium) .Marland Kitchen... Take as directed by  coumadin clinic.    Amlodipine Besylate 10 Mg Tabs (Amlodipine besylate) .Marland Kitchen... Take 1 tablet by mouth once a day  Patient Instructions: 1)  Your physician recommends that you schedule a follow-up appointment in: 6 months with the device clinic

## 2010-05-30 NOTE — Medication Information (Signed)
Summary: rov/ewj  Anticoagulant Therapy  Managed by: Cloyde Reams, RN, BSN PCP: Ivin Poot Supervising MD: Eden Emms MD, Theron Arista Indication 1: Atrial Fibrillation Lab Used: [facility] [place] Site: [address] INR POC 1.8 INR RANGE 2.0-3.0  Dietary changes: no    Health status changes: no    Bleeding/hemorrhagic complications: no    Recent/future hospitalizations: no    Any changes in medication regimen? yes       Details: Incr Norvasc to 10mg  qd.  Recent/future dental: no  Any missed doses?: no       Is patient compliant with meds? yes       Allergies (verified): No Known Drug Allergies  Anticoagulation Management History:      The patient is taking warfarin and comes in today for a routine follow up visit.  Positive risk factors for bleeding include an age of 73 years or older.  Negative risk factors for bleeding include no history of CVA/TIA.  The bleeding index is 'intermediate risk'.  Positive CHADS2 values include History of HTN.  Negative CHADS2 values include Age > 99 years old, History of Diabetes, and Prior Stroke/CVA/TIA.  His last INR was 1.1 ratio.  Anticoagulation responsible provider: Eden Emms MD, Theron Arista.  INR POC: 1.8.  Exp: 08/2010.    Anticoagulation Management Assessment/Plan:      The patient's current anticoagulation dose is Coumadin 5 mg tabs: Take as directed by coumadin clinic..  The target INR is 2 - 3.  The next INR is due 09/07/2009.  Anticoagulation instructions were given to patient.  Results were reviewed/authorized by Cloyde Reams, RN, BSN.  He was notified by Cloyde Reams RN.         Prior Anticoagulation Instructions: INR 2.2  Continue on same dosage 1.5 tablets daily except 1 tablet on Tuesdays and Saturdays.  Recheck in 4 weeks.    Current Anticoagulation Instructions: INR 1.8  Start taking 1.5 tablets daily except 1 tablet on Saturdays.  Recheck in 3-4 weeks.

## 2010-05-30 NOTE — Medication Information (Signed)
Summary: rov/ewj  Anticoagulant Therapy  Managed by: Eda Keys, PharmD PCP: Ivin Poot Supervising MD: Myrtis Ser MD, Tinnie Gens Indication 1: Atrial Fibrillation Lab Used: [facility] [place] Site: [address] INR RANGE 2.0-3.0  Dietary changes: no    Health status changes: no    Bleeding/hemorrhagic complications: no    Recent/future hospitalizations: no    Any changes in medication regimen? no    Recent/future dental: no  Any missed doses?: no       Is patient compliant with meds? yes       Current Medications (verified): 1)  Lisinopril-Hydrochlorothiazide 20-12.5 Mg Tabs (Lisinopril-Hydrochlorothiazide) .... Take One Tablet Twice Daily 2)  Dovonex 0.005 % Crea (Calcipotriene) .... Twice Daily 3)  Multivitamins   Tabs (Multiple Vitamin) .Marland Kitchen.. 1 By Mouth Once Daily 4)  Fish Oil 1000 Mg Caps (Omega-3 Fatty Acids) .... 2 Caps One  Time A Day 5)  Coreg 25 Mg Tabs (Carvedilol) .... Take One Table Twice Daily 6)  Aleve 220 Mg Tabs (Naproxen Sodium) .Marland Kitchen.. 1 Tab Two Times A Day 7)  Lutein 20 Mg Caps (Lutein) .Marland Kitchen.. 1 Cap Once Daily 8)  Coumadin 5 Mg Tabs (Warfarin Sodium) .... Take As Directed By Coumadin Clinic. 9)  Potassium Chloride Crys Cr 20 Meq Cr-Tabs (Potassium Chloride Crys Cr) .... Take One Tablet By Mouth Daily 10)  Crestor 10 Mg Tabs (Rosuvastatin Calcium) .Marland Kitchen.. 1 By Mouth Daily 11)  Ala Cort 1 % Crea (Hydrocortisone) .... As Needed 12)  Amlodipine Besylate 10 Mg Tabs (Amlodipine Besylate) .... Take 1 Tablet By Mouth Once A Day 13)  Symbicort 160-4.5 Mcg/act Aero (Budesonide-Formoterol Fumarate) .... As Needed  Allergies (verified): No Known Drug Allergies  Anticoagulation Management History:      The patient is taking warfarin and comes in today for a routine follow up visit.  Positive risk factors for bleeding include an age of 73 years or older.  Negative risk factors for bleeding include no history of CVA/TIA.  The bleeding index is 'intermediate  risk'.  Positive CHADS2 values include History of HTN.  Negative CHADS2 values include Age > 73 years old, History of Diabetes, and Prior Stroke/CVA/TIA.  His last INR was 1.1 ratio.  Anticoagulation responsible provider: Myrtis Ser MD, Tinnie Gens.  Cuvette Lot#: [id number].  Exp: 11/2010.    Anticoagulation Management Assessment/Plan:      The patient's current anticoagulation dose is Coumadin 5 mg tabs: Take as directed by coumadin clinic..  The target INR is 2 - 3.  The next INR is due 10/05/2009.  Anticoagulation instructions were given to patient.  Results were reviewed/authorized by Eda Keys, PharmD.  He was notified by Eda Keys.         Prior Anticoagulation Instructions: INR 1.8  Start taking 1.5 tablets daily except 1 tablet on Saturdays.  Recheck in 3-4 weeks.    Current Anticoagulation Instructions: INR 2.4  Continue taking 1 tablet on Saturday, and 1.5 tablets all other days.  Return to clinic in 4 weeks.

## 2010-05-30 NOTE — Miscellaneous (Signed)
  Clinical Lists Changes  Medications: Rx of COUMADIN 5 MG TABS (WARFARIN SODIUM) Take as directed by coumadin clinic.;  #40 x 3;  Signed;  Entered by: Eda Keys;  Authorized by: Nathen May, MD, [facility];  Method used: Faxed to [facility], [address] [place], [place], [place]  [address], Ph: [phone], Fax: [phone]    Prescriptions: COUMADIN 5 MG TABS (WARFARIN SODIUM) Take as directed by coumadin clinic.  #40 x 3   Entered by:   Eda Keys   Authorized by:   Nathen May, MD, [facility]   Signed by:   Eda Keys on 09/01/2009   Method used:   Faxed to ...       [facility] (retail)       [address]       [place], [place]  [address]       Ph: [phone]       Fax: [phone]   RxID:   [id number]

## 2010-05-30 NOTE — Medication Information (Signed)
Summary: rov/ewj  Anticoagulant Therapy  Managed by: Cloyde Reams, RN, BSN PCP: Ivin Poot Supervising MD: Jens Som MD, Arlys John Indication 1: Atrial Fibrillation Lab Used: [facility] [place] Site: [address] INR POC 2.2 INR RANGE 2.0-3.0  Dietary changes: no    Health status changes: no    Bleeding/hemorrhagic complications: no    Recent/future hospitalizations: no    Any changes in medication regimen? no    Recent/future dental: no  Any missed doses?: no       Is patient compliant with meds? yes       Allergies (verified): No Known Drug Allergies  Anticoagulation Management History:      The patient is taking warfarin and comes in today for a routine follow up visit.  Positive risk factors for bleeding include an age of 2 years or older.  Negative risk factors for bleeding include no history of CVA/TIA.  The bleeding index is 'intermediate risk'.  Positive CHADS2 values include History of HTN.  Negative CHADS2 values include Age > 60 years old, History of Diabetes, and Prior Stroke/CVA/TIA.  His last INR was 1.1 ratio.  Anticoagulation responsible provider: Jens Som MD, Arlys John.  INR POC: 2.2.  Cuvette Lot#: [id number].  Exp: 08/2010.    Anticoagulation Management Assessment/Plan:      The patient's current anticoagulation dose is Coumadin 5 mg tabs: Take as directed by coumadin clinic..  The target INR is 2 - 3.  The next INR is due 08/10/2009.  Anticoagulation instructions were given to patient.  Results were reviewed/authorized by Cloyde Reams, RN, BSN.  He was notified by Cloyde Reams RN.         Prior Anticoagulation Instructions: INR 2.1  Continue on same dosage 1.5 tablets daily except 1 tablet on Tuesdays and Saturdays.  Recheck in 4 weeks.    Current Anticoagulation Instructions: INR 2.2  Continue on same dosage 1.5 tablets daily except 1 tablet on Tuesdays and Saturdays.  Recheck in 4 weeks.

## 2010-05-30 NOTE — Medication Information (Signed)
Summary: rov/sp  Anticoagulant Therapy  Managed by: Weston Brass, PharmD PCP: Ivin Poot Supervising MD: Shirlee Latch MD, Dalton Indication 1: Atrial Fibrillation Lab Used: [facility] [place] Site: [address] INR POC 2.8 INR RANGE 2.0-3.0  Dietary changes: no    Health status changes: no    Bleeding/hemorrhagic complications: no    Recent/future hospitalizations: no    Any changes in medication regimen? no    Recent/future dental: no  Any missed doses?: no       Is patient compliant with meds? yes       Allergies: No Known Drug Allergies  Anticoagulation Management History:      The patient is taking warfarin and comes in today for a routine follow up visit.  Positive risk factors for bleeding include an age of 75 years or older.  Negative risk factors for bleeding include no history of CVA/TIA.  The bleeding index is 'intermediate risk'.  Positive CHADS2 values include History of HTN.  Negative CHADS2 values include Age > 16 years old, History of Diabetes, and Prior Stroke/CVA/TIA.  His last INR was 1.1 ratio.  Anticoagulation responsible provider: Shirlee Latch MD, Dalton.  INR POC: 2.8.  Cuvette Lot#: [id number].  Exp: 01/2011.    Anticoagulation Management Assessment/Plan:      The patient's current anticoagulation dose is Coumadin 5 mg tabs: Take as directed by coumadin clinic..  The target INR is 2 - 3.  The next INR is due 12/21/2009.  Anticoagulation instructions were given to patient.  Results were reviewed/authorized by Weston Brass, PharmD.  He was notified by Weston Brass PharmD.         Prior Anticoagulation Instructions: INR 3.3  Skip today's dose of Coumadin then resume same dose of 1 1/2 tablets every day except 1 tablet on Saturday.   Current Anticoagulation Instructions: INR 2.8  Continue same dose of 1 1/2 tablets every day except 1 tablet on Saturday.  Recheck INR in 4 weeks.

## 2010-05-30 NOTE — Medication Information (Signed)
Summary: rov/ewj  Anticoagulant Therapy  Managed by: Cloyde Reams, RN, BSN PCP: Ivin Poot Supervising MD: Shirlee Latch MD, Gabreal Worton Indication 1: Atrial Fibrillation Lab Used: [facility] [place] Site: [address] INR POC 2.1 INR RANGE 2.0-3.0  Dietary changes: no    Health status changes: no    Bleeding/hemorrhagic complications: no    Recent/future hospitalizations: no    Any changes in medication regimen? no    Recent/future dental: no  Any missed doses?: no       Is patient compliant with meds? yes       Allergies (verified): No Known Drug Allergies  Anticoagulation Management History:      The patient is taking warfarin and comes in today for a routine follow up visit.  Positive risk factors for bleeding include an age of 75 years or older.  Negative risk factors for bleeding include no history of CVA/TIA.  The bleeding index is 'intermediate risk'.  Positive CHADS2 values include History of HTN.  Negative CHADS2 values include Age > 25 years old, History of Diabetes, and Prior Stroke/CVA/TIA.  His last INR was 1.1 ratio.  Anticoagulation responsible provider: Shirlee Latch MD, Caliope Ruppert.  INR POC: 2.1.  Cuvette Lot#: [id number].  Exp: 07/2010.    Anticoagulation Management Assessment/Plan:      The patient's current anticoagulation dose is Coumadin 5 mg tabs: Take as directed by coumadin clinic..  The target INR is 2 - 3.  The next INR is due 07/13/2009.  Anticoagulation instructions were given to patient.  Results were reviewed/authorized by Cloyde Reams, RN, BSN.  He was notified by Cloyde Reams RN.         Prior Anticoagulation Instructions: INR 1.9  Start taking 1.5 tablets daily except 1 tablet on Tuesdays and Saturdays.  Recheck in 3 weeks.    Current Anticoagulation Instructions: INR 2.1  Continue on same dosage 1.5 tablets daily except 1 tablet on Tuesdays and Saturdays.  Recheck in 4 weeks.

## 2010-05-30 NOTE — Progress Notes (Signed)
Summary: echo results  Phone Note Outgoing Call Call back at [facility] Phone [phone]   Call placed by: Gypsy Balsam RN BSN,  August 08, 2009 2:58 PM Summary of Call: Left message with wife for patient to call about echo results.  Stable per Dr Graciela Husbands.  Pt should keep appt in May as scheduled. Gypsy Balsam RN BSN  August 08, 2009 2:58 PM   Follow-up for Phone Call        Pt notified. Gypsy Balsam RN BSN  August 08, 2009 4:15 PM

## 2010-05-30 NOTE — Medication Information (Signed)
Summary: rov/sp  Anticoagulant Therapy  Managed by: Weston Brass, PharmD PCP: Ivin Poot Supervising MD: Myrtis Ser MD, Tinnie Gens Indication 1: Atrial Fibrillation Lab Used: [facility] [place] Site: [address] INR POC 2.1  INR RANGE 2.0-3.0  Dietary changes: no    Health status changes: no    Bleeding/hemorrhagic complications: no    Recent/future hospitalizations: no    Any changes in medication regimen? no    Recent/future dental: no  Any missed doses?: no       Is patient compliant with meds? yes       Allergies: No Known Drug Allergies  Anticoagulation Management History:      The patient is taking warfarin and comes in today for a routine follow up visit.  Positive risk factors for bleeding include an age of 75 years or older.  Negative risk factors for bleeding include no history of CVA/TIA.  The bleeding index is 'intermediate risk'.  Positive CHADS2 values include History of HTN.  Negative CHADS2 values include Age > 73 years old, History of Diabetes, and Prior Stroke/CVA/TIA.  The start date was 09/13/2008.  His last INR was 1.1 ratio.  Anticoagulation responsible provider: Myrtis Ser MD, Tinnie Gens.  INR POC: 2.1 .  Cuvette Lot#: [id number].  Exp: 03/2011.    Anticoagulation Management Assessment/Plan:      The patient's current anticoagulation dose is Coumadin 5 mg tabs: Take as directed by coumadin clinic., Warfarin sodium 5 mg tabs: Use as directed by Anticoagulation Clinic.  The target INR is 2 - 3.  The next INR is due 03/15/2010.  Anticoagulation instructions were given to patient.  Results were reviewed/authorized by Weston Brass, PharmD.  He was notified by Haynes Hoehn, PharmD Candidate.         Prior Anticoagulation Instructions: INR 2.8  Continue same dose of 1 1/2 tablets every day except 1 tablet on Saturday.  Recheck INR in 4 weeks.   Current Anticoagulation Instructions: INR 2.1  Continue Coumadin as scheduled:  1 and 1/2 tablets every day of the  week, except 1 tablet on Saturday.

## 2010-05-30 NOTE — Progress Notes (Signed)
Summary: Doxycycline start/Coumadin f/u  Phone Note Call from Patient   Caller: Patient Call For: Coumadin Clinic Summary of Call: Pt called states he saw his primary MD and he has rx a 10 day course of Doxycycline.  Pt will be starting on Doxy today 03/02/10.  Made OV on 03/06/10 to check INR.   Initial call taken by: Cloyde Reams RN,  March 02, 2010 10:10 AM

## 2010-05-30 NOTE — Medication Information (Signed)
Summary: rov/ewj  Anticoagulant Therapy  Managed by: Cloyde Reams, RN, BSN PCP: Ivin Poot Supervising MD: Shirlee Latch MD, Ayano Douthitt Indication 1: Atrial Fibrillation Lab Used: [facility] [place] Site: [address] INR POC 1.8 INR RANGE 2.0-3.0  Dietary changes: no    Health status changes: no    Bleeding/hemorrhagic complications: no    Recent/future hospitalizations: no    Any changes in medication regimen? yes       Details: Added amlodipine 5mg  qd.  Recent/future dental: no  Any missed doses?: no       Is patient compliant with meds? yes       Current Medications (verified): 1)  Lisinopril-Hydrochlorothiazide 20-12.5 Mg Tabs (Lisinopril-Hydrochlorothiazide) .... Take One Tablet Twice Daily 2)  Dovonex 0.005 % Crea (Calcipotriene) .... Twice Daily 3)  Multivitamins   Tabs (Multiple Vitamin) .Marland Kitchen.. 1 By Mouth Once Daily 4)  Fish Oil 1000 Mg Caps (Omega-3 Fatty Acids) .... 2 Caps One  Time A Day 5)  Coreg 25 Mg Tabs (Carvedilol) .... Take One Table Twice Daily 6)  Aleve 220 Mg Tabs (Naproxen Sodium) .Marland Kitchen.. 1 Tab Two Times A Day 7)  Lutein 20 Mg Caps (Lutein) .Marland Kitchen.. 1 Cap Once Daily 8)  Coumadin 5 Mg Tabs (Warfarin Sodium) .... Take As Directed By Coumadin Clinic. 9)  Potassium Chloride Crys Cr 20 Meq Cr-Tabs (Potassium Chloride Crys Cr) .... Take One Tablet By Mouth Daily 10)  Crestor 10 Mg Tabs (Rosuvastatin Calcium) .Marland Kitchen.. 1 By Mouth Daily 11)  Ala Cort 1 % Crea (Hydrocortisone) .... As Needed 12)  Amlodipine Besylate 5 Mg Tabs (Amlodipine Besylate) .... Take 1 Tablet By Mouth Once A Day  Allergies (verified): No Known Drug Allergies  Anticoagulation Management History:      The patient is taking warfarin and comes in today for a routine follow up visit.  Positive risk factors for bleeding include an age of 75 years or older.  Negative risk factors for bleeding include no history of CVA/TIA.  The bleeding index is 'intermediate risk'.  Positive CHADS2 values  include History of HTN.  Negative CHADS2 values include Age > 25 years old, History of Diabetes, and Prior Stroke/CVA/TIA.  His last INR was 1.1 ratio.  Anticoagulation responsible provider: Shirlee Latch MD, Esta Carmon.  INR POC: 1.8.  Cuvette Lot#: [id number].  Exp: 05/2010.    Anticoagulation Management Assessment/Plan:      The patient's current anticoagulation dose is Coumadin 5 mg tabs: Take as directed by coumadin clinic..  The target INR is 2 - 3.  The next INR is due 05/25/2009.  Anticoagulation instructions were given to patient.  Results were reviewed/authorized by Cloyde Reams, RN, BSN.  He was notified by Cloyde Reams RN.         Prior Anticoagulation Instructions: INR 1.8  Take 2 tablets today then resume same dosage 1 tablet daily except 1.5 tablets on Mondays, Wednesdays, and Fridays.  Recheck in 3 weeks.    Current Anticoagulation Instructions: INR 1.8  Take 2 tablets today then start taking 1.5 tablets daily except 1 tablet on Tuesdays, Thursdays, and Saturdays.  Recheck in 3 weeks.

## 2010-06-01 ENCOUNTER — Encounter (INDEPENDENT_AMBULATORY_CARE_PROVIDER_SITE_OTHER): Payer: BC Managed Care – PPO

## 2010-06-01 ENCOUNTER — Encounter: Payer: Self-pay | Admitting: Internal Medicine

## 2010-06-01 ENCOUNTER — Ambulatory Visit: Admit: 2010-06-01 | Payer: Self-pay | Admitting: Internal Medicine

## 2010-06-01 DIAGNOSIS — I428 Other cardiomyopathies: Secondary | ICD-10-CM

## 2010-06-01 DIAGNOSIS — I4891 Unspecified atrial fibrillation: Secondary | ICD-10-CM

## 2010-06-01 NOTE — Procedures (Addendum)
Summary: Upper Endoscopy  Patient: Andrew Vance Note: All result statuses are Final unless otherwise noted.  Tests: (1) Upper Endoscopy (EGD)   EGD Upper Endoscopy       DONE     [facility]     [address]     [place], [place]  [address]           ENDOSCOPY PROCEDURE REPORT           PATIENT:  [name], [name]  MR#:  [id number]     BIRTHDATE:  1936-03-15, 74 yrs. old  GENDER:  male     ENDOSCOPIST:  Rachael Fee, MD     PROCEDURE DATE:  05/27/2010     PROCEDURE:  EGD, diagnostic 43235     ASA CLASS:  Class II     INDICATIONS:  GI bleed     MEDICATIONS:  There was residual sedation effect present from     prior procedure., Versed 2 mg IV     TOPICAL ANESTHETIC:  none           DESCRIPTION OF PROCEDURE:   After the risks benefits and     alternatives of the procedure were thoroughly explained, informed     consent was obtained.  The EG-[id number] ([id number]) endoscope was     introduced through the mouth and advanced to the second portion of     the duodenum, without limitations.  The instrument was slowly     withdrawn as the mucosa was fully examined.     <<PROCEDUREIMAGES>>     The upper, middle, and distal third of the esophagus were     carefully inspected and no abnormalities were noted. The z-line     was well seen at the GEJ. The endoscope was pushed into the fundus     which was normal including a retroflexed view. The antrum,gastric     body, first and second part of the duodenum were unremarkable (see     image001, image002, image003, image004, image005, and image006).     Retroflexed views revealed no abnormalities.    The scope was then     withdrawn from the patient and the procedure completed.     COMPLICATIONS:  None           ENDOSCOPIC IMPRESSION:     1) Normal EGD     2) His bleeding was most likely from colonic diverticulosis     (throughout colon)           RECOMMENDATIONS:     Follow clinically, OK to advance diet.  Observe at  least another     24 hours.     Would not restart coumadin for 7 days.     If he has clear, overt, active GI bleeding again he needs     immediate nuc medicine bleeding scan to try to localize the     bleeding (most likely diveritular) and then angiogram if positive.                 ______________________________     Rachael Fee, MD           cc: Sheryn Bison, MD           n.     eSIGNED:   Rachael Fee at 05/27/2010 09:36 AM           Anthoney Harada, [id number]  Note: An exclamation mark (!) indicates a result that was not dispersed  into the flowsheet. Document Creation Date: 05/27/2010 9:36 AM _______________________________________________________________________  (1) Order result status: Final Collection or observation date-time: 05/27/2010 09:30 Requested date-time:  Receipt date-time:  Reported date-time:  Referring Physician:   Ordering Physician: Rob Bunting [phone]) Specimen Source:  Source: [employer]una [employer] Order Number: [id number] Lab site:

## 2010-06-01 NOTE — Procedures (Addendum)
Summary: Colonoscopy  Patient: [name] Note: All result statuses are Final unless otherwise noted.  Tests: (1) Colonoscopy (COL)   COL Colonoscopy           DONE     [facility]     [address]     [place], [place]  [address]           COLONOSCOPY PROCEDURE REPORT           PATIENT:  Andrew Vance, Andrew Vance  MR#:  [id number]     BIRTHDATE:  1935-10-05, 74 yrs. old  GENDER:  male     ENDOSCOPIST:  Rachael Fee, MD     PROCEDURE DATE:  05/27/2010     PROCEDURE:  Diagnostic Colonoscopy     ASA CLASS:  Class II     INDICATIONS:  GI bleeding (6 unit GI bleeding with significant     hemodynamic instability initially, has stablized; coumadin     reversed)     MEDICATIONS:   Fentanyl 62.5 mcg IV, Versed 6 mg IV           DESCRIPTION OF PROCEDURE:   After the risks benefits and     alternatives of the procedure were thoroughly explained, informed     consent was obtained.  Digital rectal exam was performed and     revealed no rectal masses.   The Pentax Colonoscope [id number]     endoscope was introduced through the anus and advanced to the     terminal ileum which was intubated for a short distance, without     limitations.  The quality of the prep was adequate, using     MoviPrep.  The instrument was then slowly withdrawn as the colon     was fully examined.     <<PROCEDUREIMAGES>>     FINDINGS:  There were multiple large and small mouthed     diveriticulum throughout entire colon. None with adherent clot or     active bleeding. There was red tinged prep/liquid stool throughout     colon, including in distal terminal ileum (see image001, image002,     image003, image004, and image005).  This was otherwise a normal     examination of the colon.   Retroflexed views in the rectum     revealed no abnormalities.    The scope was then withdrawn from     the patient and the procedure completed.     COMPLICATIONS:  None           ENDOSCOPIC IMPRESSION:     1)  Diverticulosis changes throughout entire colon; no active     bleeding.  There was reddish  prep throughout colon and in     terminal ileum (may be "backwash")     2) Otherwise normal examination           RECOMMENDATIONS:     EGD now to check for upper source of his GI bleeding.           ______________________________     Rachael Fee, MD           cc: Sheryn Bison, MD           n.     eSIGNED:   Rachael Fee at 05/27/2010 09:26 AM           Anthoney Harada, [id number]  Note: An exclamation mark (!) indicates a result that was not dispersed into the flowsheet. Document Creation Date:  05/27/2010 9:27 AM _______________________________________________________________________  (1) Order result status: Final Collection or observation date-time: 05/27/2010 09:20 Requested date-time:  Receipt date-time:  Reported date-time:  Referring Physician:   Ordering Physician: Rob Bunting [phone]) Specimen Source:  Source: Launa Grill Order Number: [id number] Lab site:

## 2010-06-01 NOTE — Medication Information (Signed)
Summary: rov/sp  Anticoagulant Therapy  Managed by: Cloyde Reams, RN, BSN PCP: Ivin Poot Supervising MD: Eden Emms MD, Theron Arista Indication 1: Atrial Fibrillation Lab Used: [facility] [place] Site: [address] INR POC 1.9 INR RANGE 2.0-3.0  Dietary changes: yes       Details: Incr in vit K intake  Health status changes: no    Bleeding/hemorrhagic complications: no    Recent/future hospitalizations: no    Any changes in medication regimen? no    Recent/future dental: no  Any missed doses?: no       Is patient compliant with meds? yes       Allergies: No Known Drug Allergies  Anticoagulation Management History:      The patient is taking warfarin and comes in today for a routine follow up visit.  Positive risk factors for bleeding include an age of 50 years or older.  Negative risk factors for bleeding include no history of CVA/TIA.  The bleeding index is 'intermediate risk'.  Positive CHADS2 values include History of HTN.  Negative CHADS2 values include Age > 81 years old, History of Diabetes, and Prior Stroke/CVA/TIA.  The start date was 09/13/2008.  His last INR was 1.1 ratio.  Anticoagulation responsible provider: Eden Emms MD, Theron Arista.  INR POC: 1.9.  Cuvette Lot#: [id number].  Exp: 01/2011.    Anticoagulation Management Assessment/Plan:      The patient's current anticoagulation dose is Coumadin 5 mg tabs: Take as directed by coumadin clinic., Warfarin sodium 5 mg tabs: Use as directed by Anticoagulation Clinic.  The target INR is 2 - 3.  The next INR is due 05/30/2010.  Anticoagulation instructions were given to patient.  Results were reviewed/authorized by Cloyde Reams, RN, BSN.  He was notified by Cloyde Reams RN.         Prior Anticoagulation Instructions: INR 2.1  Continue same dose of 1 1/2 tablets every day except 1 tablet on Saturday.  Recheck INR in 4 weeks.   Current Anticoagulation Instructions: INR 1.9  Take 2 tablets today, then resume same  dosage 1.5 tablets daily except 1 tablet on Saturdays.  Recheck in 4 weeks.

## 2010-06-01 NOTE — Letter (Signed)
Summary: Colonoscopy Date Change Letter  [facility]  [address]   [place], [place] [address]   Phone: [phone]  Fax: [phone]      May 18, 2010 MRN: [id number]   KESHAUN DUBEY [address] [place], [place]  [address]   Dear Mr. HOLAWAY,   Previously you were recommended to have a repeat colonoscopy around this time. Your chart was recently reviewed by Dr. Jarold Motto of [facility]. Follow up colonoscopy is now recommended in January 2017. This revised recommendation is based on current, nationally recognized guidelines for colorectal cancer screening and polyp surveillance. These guidelines are endorsed by the American Cancer Society, [employer] on Colorectal Cancer as well as numerous other major medical organizations.  Please understand that our recommendation assumes that you do not have any new symptoms such as bleeding, a change in bowel habits, anemia, or significant abdominal discomfort. If you do have any concerning GI symptoms or want to discuss the guideline recommendations, please call to arrange an office visit at your earliest convenience. Otherwise we will keep you in our reminder system and contact you 1-2 months prior to the date listed above to schedule your next colonoscopy.  Thank you,   [facility] [phone]

## 2010-06-11 NOTE — Discharge Summary (Signed)
NAME:  Andrew Vance, Andrew Vance NO.:  [id number]  MEDICAL RECORD NO.:  [id number]          PATIENT TYPE:  INP  LOCATION:  4741                         FACILITY:  [facility]  PHYSICIAN:  Brendia Sacks, MD    DATE OF BIRTH:  October 17, 1935  DATE OF ADMISSION:  05/25/2010 DATE OF DISCHARGE:  05/28/2010                              DISCHARGE SUMMARY   PRIMARY CARE PHYSICIAN:  Reuben Likes, MD  PRIMARY CARDIOLOGIST:  Duke Salvia, MD, [facility], in [place].  CONDITION ON DISCHARGE:  Improved.  DISCHARGE DIAGNOSES: 1. Gastrointestinal bleed, presumed diverticular - if the patient     presents again with bleeding, he should have an immediate nuclear     medicine scan to try to ascertain the location of the bleeding. 2. Hemorrhagic and hypovolemic shock. 3. Acute blood loss anemia. 4. Supratherapeutic INR, reversed. 5. Atrial fibrillation, stable. 6. Nonischemic cardiomyopathy with concomitant grade 1 diastolic     dysfunction, compensated. 7. Hypertension, stable. 8. Thrombocytopenia, resolving.  HISTORY OF PRESENT ILLNESS: 1. This is a 75 year old man who presented to the emergency room with     GI bleeding.  He was initially seen and admitted by Critical Care     and seen in consultation with Gastroenterology.  He was admitted to     the ICU for hemorrhagic and hypovolemic shock secondary to GI bleed     requiring initial vasopressor support.  He was transfused 5 units     packed red blood cells, given vitamin K and FFP to reverse his INR.     The bleeding stopped.  The patient was weaned off pressors and his     condition rapidly improved.  He underwent EGD and colonoscopy which     were unremarkable.  He has been reevaluated today by Dr. Christella Hartigan.     It has been recommended that the patient hold Coumadin for 7 days     and then restart.  The patient does not take aspirin, and I advised     him to discontinue Aleve for the time being.  The presumed etiology     of his  bleed is diverticular. 2. Hemorrhagic and hypovolemic shock as above. 3. Acute blood loss anemia as described above. 4. Supratherapeutic INR.  This was reversed as described above. 5. Atrial fibrillation.  This has remained stable.  He has resume his     beta-blocker.  No Coumadin for 7 days, that is until June 04, 2010. 6. Hypertension.  The patient's blood pressure has been low normal.     He is asymptomatic.  He has resumed his beta-blocker.  At this     point, I have asked him to hold his Norvasc and     lisinopril/hydrochlorothiazide combination until he follows up with     Dr. Lorenz Coaster.  As he continues to improve, he may need to restart     these medications for his blood pressure. 7. Mild thrombocytopenia.  This is improving secondary to his acute     bleeding. 8. Fever.  The night prior to discharge, the patient did have fever  up     to 102.4 degrees.  The patient had no other symptoms.  This     morning, he feels fine.  He is afebrile.  He has no cough or     shortness of breath.  No urinary complaints and feels great.  CONSULTATIONS: 1. Dr. Christella Hartigan of Gastroenterology. 2. The patient was admitted to the Critical Care Service and then     transferred to the Hospitalist Service.  PROCEDURES: 1. Transfusion of packed red blood cells and FFP. 2. EGD on May 27, 2010:  Normal EGD. 3. Colonoscopy on May 27, 2010:  Diverticulosis throughout the     entire colon.  No active bleeding.  Prep throughout colon and then     terminal ileum (may be "bag wash)," otherwise normal examination.  IMAGING: 1. Chest x-ray on May 25, 2010:  No active cardiopulmonary     disease. 2. Chest x-ray on May 26, 2010:  Clear lungs, mild cardia     enlargement.  PERTINENT LABORATORY STUDIES: 1. CBC notable for being 11.8 on admission nadir 6.4, on discharge 8.2     and stable, platelet count is rising on discharge, currently at     106,000. 2. INR on discharge 1.19, on  admission 3.59. 3. Basic metabolic panel on discharge unremarkable. 4. Cardiac enzymes negative.  PHYSICAL EXAMINATION:  GENERAL:  On discharge, the patient is feeling well.  No complaints. VITAL SIGNS:  Temperature is 98.4, T-max is 102.3, pulse 89, respirations 20, blood pressure 107/67, sating 98% on room air. CARDIOVASCULAR:  Regular rate and rhythm.  No murmur, rub, gallop. RESPIRATORY:  Clear to auscultation bilaterally.  No wheezes, rales, or rhonchi.  Normal respiratory effort.  The patient is very well appearing.  DISCHARGE INSTRUCTIONS:  The patient will be discharged home today.  DIET:  Low sodium, heart healthy.  ACTIVITIES:  Unrestricted.  FOLLOWUP:  With primary care physician, Dr. Leslee Home, in 1 week; Dr. Graciela Husbands of [facility] for PT/INR check, the patient will schedule.  DISCHARGE MEDICATIONS: 1. Carvedilol 25 mg p.o. b.i.d. 2. Coumadin 5 mg one to 1-1/2 tablets by mouth every evening, do not     restart until Sunday, June 04, 2010. 3. Crestor 10 mg p.o. at bedtime. 4. Dovonex topical 0.005% one application b.i.d. 5. Fish oil 1 g p.o. daily. 6. Hydrocortisone 1% topically b.i.d. 7. Lutein 20 mg p.o. at bedtime. 8. Multivitamin p.o. daily. 9. Potassium chloride 20 mEq p.o. daily. 10.Symbicort 160/4.5 mcg 2 puffs b.i.d. as needed for shortness of     breath.  Discontinue the following medications: 1. Aleve. 2. Amlodipine. 3. Hydrochlorothiazide. 4. Lisinopril.  Things to follow up in the outpatient setting: 1. Resumption of Coumadin as described above. 2. Resumption of additional antihypertensives as clinically indicated.  Please note, if the patient presents to the emergency room with rebleeding, he should immediately have a nuclear medicine red blood cell tagged scan to try to localize the bleeding.  TIME COORDINATING DISCHARGE:  Twenty five minutes.     Brendia Sacks, MD     DG/MEDQ  D:  05/28/2010  T:  05/29/2010  Job:   [id number]  cc:   Reuben Likes, M.D.  Electronically Signed by Brendia Sacks  on 06/11/2010 03:59:22 PM

## 2010-06-15 ENCOUNTER — Encounter: Payer: Self-pay | Admitting: Cardiology

## 2010-06-15 ENCOUNTER — Encounter (INDEPENDENT_AMBULATORY_CARE_PROVIDER_SITE_OTHER): Payer: BC Managed Care – PPO

## 2010-06-15 DIAGNOSIS — I4891 Unspecified atrial fibrillation: Secondary | ICD-10-CM

## 2010-06-15 DIAGNOSIS — Z7901 Long term (current) use of anticoagulants: Secondary | ICD-10-CM

## 2010-06-15 LAB — CONVERTED CEMR LAB: POC INR: 1.8

## 2010-06-15 NOTE — Cardiovascular Report (Signed)
Summary: Office Visit   Office Visit   Imported By: Roderic Ovens 06/08/2010 16:15:13  _____________________________________________________________________  External Attachment:    Type:   Image     Comment:   External Document

## 2010-06-15 NOTE — Procedures (Signed)
Summary: Cardiology Device Clinic   Current Medications (verified): 1)  Dovonex 0.005 % Crea (Calcipotriene) .... Twice Daily 2)  Multivitamins   Tabs (Multiple Vitamin) .Marland Kitchen.. 1 By Mouth Once Daily 3)  Fish Oil 1000 Mg Caps (Omega-3 Fatty Acids) .... 2 Caps One  Time A Day 4)  Coreg 25 Mg Tabs (Carvedilol) .... Take One Table Twice Daily 5)  Lutein 20 Mg Caps (Lutein) .Marland Kitchen.. 1 Cap Once Daily 6)  Coumadin 5 Mg Tabs (Warfarin Sodium) .... Take As Directed By Coumadin Clinic. 7)  Potassium Chloride Crys Cr 20 Meq Cr-Tabs (Potassium Chloride Crys Cr) .... Take One Tablet By Mouth Daily 8)  Crestor 10 Mg Tabs (Rosuvastatin Calcium) .Marland Kitchen.. 1 By Mouth Daily 9)  Ala Cort 1 % Crea (Hydrocortisone) .... As Needed 10)  Warfarin Sodium 5 Mg Tabs (Warfarin Sodium) .... Use As Directed By Anticoagulation Clinic 11)  Symbicort 160-4.5 Mcg/act Aero (Budesonide-Formoterol Fumarate) .... As Needed  Allergies (verified): No Known Drug Allergies  PPM Specifications Following MD:  Sherryl Manges, MD     PPM Vendor:  [employer]     PPM Model Number:  [id number]     PPM Serial Number:  [id number] PPM DOI:  09/17/2008     PPM Implanting MD:  Lewayne Bunting, MD  Lead 1    Location: RA     DOI: 09/17/2008     Model #: [id number]     Serial #: [id number]     Status: active Lead 2    Location: RV     DOI: 09/17/2008     Model #: [id number]     Serial #: [id number]     Status: active  Magnet Response Rate:  BOL 100 ERI 85    PPM Follow Up Battery Voltage:  2.96 V     Battery Est. Longevity:  7.4-8.0 yrs     Pacer Dependent:  Yes       PPM Device Measurements Atrium  Amplitude: 0.7 mV, Impedance: 310 ohms,  Right Ventricle  Amplitude: 12.0 mV, Impedance: 490 ohms, Threshold: 1.0 V at 0.4 msec  Episodes MS Episodes:  3836     Percent Mode Switch:  84%     Coumadin:  Yes Atrial Pacing:  14%     Ventricular Pacing:  35%  Parameters Mode:  DDDR     Lower Rate Limit:  60     Upper Rate Limit:  90 Paced AV Delay:  300     Sensed AV  Delay:  275 Next Cardiology Appt Due:  08/29/2010 Tech Comments:  PT IN AF 84% OF TIME. +COUMADIN--ON HOLD TILL SUNDAY.  PT JUST RECENTLY D/C FROM HOSPITAL. NORMAL DEVICE FUNCTION. CHANGED ATRIAL SENSITIVITY FROM 0.5 TO 0.23mV DUE TO SENSING.  TURNED OFF AUTOCAPTURE DUE TO SEVERAL READINGS OUT OF RANGE.  RV OUTPUT SET TO 2.5 V.  ROV IN MAY 2012 W/GT. Vella Kohler  June 01, 2010 4:37 PM

## 2010-06-21 NOTE — Medication Information (Signed)
Summary: rov/sp  Anticoagulant Therapy  Managed by: Bethena Midget, RN, BSN PCP: Ivin Poot Supervising MD: Myrtis Ser MD, Tinnie Gens Indication 1: Atrial Fibrillation Lab Used: [facility] [name] Site: [address] INR POC 1.8 INR RANGE 2.0-3.0  Dietary changes: no    Health status changes: no    Bleeding/hemorrhagic complications: no    Recent/future hospitalizations: yes       Details: Was hospitalized from 05/25/10 and discharged on 05/28/10  Any changes in medication regimen? no    Recent/future dental: no  Any missed doses?: yes     Details: off 05/25/10 for GI Bleed, restarted back on 06/04/10  Is patient compliant with meds? yes      Comments: Pt also received 6 units PRBC and 2 units of plasma while in hospital he states  Current Medications (verified): 1)  Dovonex 0.005 % Crea (Calcipotriene) .... Twice Daily 2)  Multivitamins   Tabs (Multiple Vitamin) .Marland Kitchen.. 1 By Mouth Once Daily 3)  Fish Oil 1000 Mg Caps (Omega-3 Fatty Acids) .... Take 1 Capsule Two Times A Day 4)  Coreg 25 Mg Tabs (Carvedilol) .... Take One Table Twice Daily 5)  Lutein 20 Mg Caps (Lutein) .Marland Kitchen.. 1 Cap Once Daily At Bedtime 6)  Potassium Chloride Crys Cr 20 Meq Cr-Tabs (Potassium Chloride Crys Cr) .... Take One Tablet By Mouth Daily 7)  Crestor 10 Mg Tabs (Rosuvastatin Calcium) .Marland Kitchen.. 1 By Mouth Daily 8)  Ala Cort 1 % Crea (Hydrocortisone) .... As Needed 9)  Warfarin Sodium 5 Mg Tabs (Warfarin Sodium) .... Use As Directed By Anticoagulation Clinic 10)  Symbicort 160-4.5 Mcg/act Aero (Budesonide-Formoterol Fumarate) .... As Needed 11)  Ferrous Sulfate 325 (65 Fe) Mg Tabs (Ferrous Sulfate) .... Take 1 Tablet At Bedtime  Allergies (verified): No Known Drug Allergies  Anticoagulation Management History:      The patient is taking warfarin and comes in today for a routine follow up visit.  Positive risk factors for bleeding include an age of 30 years or older.  Negative risk factors for bleeding  include no history of CVA/TIA.  The bleeding index is 'intermediate risk'.  Positive CHADS2 values include History of HTN.  Negative CHADS2 values include Age > 39 years old, History of Diabetes, and Prior Stroke/CVA/TIA.  The start date was 09/13/2008.  His last INR was 1.1 ratio.  Anticoagulation responsible provider: Myrtis Ser MD, Tinnie Gens.  INR POC: 1.8.  Cuvette Lot#: [id number].  Exp: 05/2011.    Anticoagulation Management Assessment/Plan:      The patient's current anticoagulation dose is Warfarin sodium 5 mg tabs: Use as directed by Anticoagulation Clinic.  The target INR is 2 - 3.  The next INR is due 06/29/2010.  Anticoagulation instructions were given to patient.  Results were reviewed/authorized by Bethena Midget, RN, BSN.  He was notified by Bethena Midget, RN, BSN.         Prior Anticoagulation Instructions: INR 1.9  Take 2 tablets today, then resume same dosage 1.5 tablets daily except 1 tablet on Saturdays.  Recheck in 4 weeks.    Current Anticoagulation Instructions: INR 1.8 Today take 2 pills then resume 1.5 pills everyday except 1 pill on Saturdays. Recheck in 2 weeks.

## 2010-06-26 ENCOUNTER — Encounter: Payer: Self-pay | Admitting: Internal Medicine

## 2010-06-26 DIAGNOSIS — I4891 Unspecified atrial fibrillation: Secondary | ICD-10-CM | POA: Insufficient documentation

## 2010-06-29 ENCOUNTER — Encounter (INDEPENDENT_AMBULATORY_CARE_PROVIDER_SITE_OTHER): Payer: BC Managed Care – PPO

## 2010-06-29 ENCOUNTER — Encounter: Payer: Self-pay | Admitting: Cardiology

## 2010-06-29 DIAGNOSIS — Z7901 Long term (current) use of anticoagulants: Secondary | ICD-10-CM

## 2010-06-29 DIAGNOSIS — I4891 Unspecified atrial fibrillation: Secondary | ICD-10-CM

## 2010-07-06 NOTE — Medication Information (Signed)
Summary: rov/tm  Anticoagulant Therapy  Managed by: Cloyde Reams, RN, BSN PCP: Ivin Poot Supervising MD: Patty Sermons Indication 1: Atrial Fibrillation Lab Used: [facility] [place] Site: [address] INR POC 2.3 INR RANGE 2.0-3.0  Dietary changes: no    Health status changes: no    Bleeding/hemorrhagic complications: no    Recent/future hospitalizations: no    Any changes in medication regimen? no    Recent/future dental: no  Any missed doses?: no       Is patient compliant with meds? yes       Allergies: No Known Drug Allergies  Anticoagulation Management History:      The patient is taking warfarin and comes in today for a routine follow up visit.  Positive risk factors for bleeding include an age of 43 years or older.  Negative risk factors for bleeding include no history of CVA/TIA.  The bleeding index is 'intermediate risk'.  Positive CHADS2 values include History of HTN.  Negative CHADS2 values include Age > 16 years old, History of Diabetes, and Prior Stroke/CVA/TIA.  The start date was 09/13/2008.  His last INR was 1.1 ratio.  Anticoagulation responsible provider: Joyceline Maiorino.  INR POC: 2.3.  Cuvette Lot#: [id number].  Exp: 05/2011.    Anticoagulation Management Assessment/Plan:      The patient's current anticoagulation dose is Warfarin sodium 5 mg tabs: Use as directed by Anticoagulation Clinic.  The target INR is 2 - 3.  The next INR is due 07/27/2010.  Anticoagulation instructions were given to patient.  Results were reviewed/authorized by Cloyde Reams, RN, BSN.  He was notified by Cloyde Reams RN.         Prior Anticoagulation Instructions: INR 1.8 Today take 2 pills then resume 1.5 pills everyday except 1 pill on Saturdays. Recheck in 2 weeks.   Current Anticoagulation Instructions: INR 2.3  Continue on same dosage 1.5 tablets daily except 1 tablet on Saturdays.  Recheck in 4 weeks.

## 2010-07-27 ENCOUNTER — Ambulatory Visit (INDEPENDENT_AMBULATORY_CARE_PROVIDER_SITE_OTHER): Payer: BC Managed Care – PPO | Admitting: *Deleted

## 2010-07-27 DIAGNOSIS — I4891 Unspecified atrial fibrillation: Secondary | ICD-10-CM

## 2010-07-27 DIAGNOSIS — Z7901 Long term (current) use of anticoagulants: Secondary | ICD-10-CM

## 2010-07-27 NOTE — Patient Instructions (Signed)
INR 2.8  Continue same dose of 1 1/2 tablets every day except 1 tablet on Saturday.  Recheck INR  1 week after procedure.

## 2010-07-28 ENCOUNTER — Encounter: Payer: Self-pay | Admitting: Internal Medicine

## 2010-07-28 ENCOUNTER — Telehealth: Payer: Self-pay | Admitting: Pharmacist

## 2010-07-28 DIAGNOSIS — I429 Cardiomyopathy, unspecified: Secondary | ICD-10-CM | POA: Insufficient documentation

## 2010-07-28 DIAGNOSIS — I951 Orthostatic hypotension: Secondary | ICD-10-CM | POA: Insufficient documentation

## 2010-07-28 DIAGNOSIS — Z950 Presence of cardiac pacemaker: Secondary | ICD-10-CM | POA: Insufficient documentation

## 2010-07-28 DIAGNOSIS — I495 Sick sinus syndrome: Secondary | ICD-10-CM | POA: Insufficient documentation

## 2010-07-28 NOTE — Telephone Encounter (Signed)
Message copied by Weston Brass on Fri Jul 28, 2010 11:48 AM ------      Message from: Sherryl Manges      Created: Fri Jul 28, 2010  9:50 AM       Ok to hold coumadin      steve      ----- Message -----         From: Mariane Masters, PHARMD         Sent: 07/27/2010   8:18 AM           To: Duke Salvia, MD            Pt needs to hold Coumadin for dental extraction.  Is this ok? Thanks!

## 2010-07-28 NOTE — Telephone Encounter (Signed)
LMOM for pt to let him know okay to hold Coumadin for dental work.

## 2010-08-08 LAB — POCT I-STAT 3, ART BLOOD GAS (G3+)
Bicarbonate: 23.5 mEq/L (ref 20.0–24.0)
pH, Arterial: 7.44 (ref 7.350–7.450)
pO2, Arterial: 97 mmHg (ref 80.0–100.0)

## 2010-08-08 LAB — CBC
HCT: 40.3 % (ref 39.0–52.0)
Hemoglobin: 14.1 g/dL (ref 13.0–17.0)
MCHC: 35 g/dL (ref 30.0–36.0)
MCV: 94 fL (ref 78.0–100.0)
RDW: 13.7 % (ref 11.5–15.5)

## 2010-08-08 LAB — POCT I-STAT 3, VENOUS BLOOD GAS (G3P V)
Acid-Base Excess: 2 mmol/L (ref 0.0–2.0)
Bicarbonate: 27.7 mEq/L — ABNORMAL HIGH (ref 20.0–24.0)
TCO2: 29 mmol/L (ref 0–100)
pO2, Ven: 35 mmHg (ref 30.0–45.0)

## 2010-08-08 LAB — PROTIME-INR
INR: 2.3 — ABNORMAL HIGH (ref 0.00–1.49)
Prothrombin Time: 20.4 seconds — ABNORMAL HIGH (ref 11.6–15.2)
Prothrombin Time: 27 seconds — ABNORMAL HIGH (ref 11.6–15.2)

## 2010-08-17 ENCOUNTER — Ambulatory Visit (INDEPENDENT_AMBULATORY_CARE_PROVIDER_SITE_OTHER): Payer: BC Managed Care – PPO | Admitting: *Deleted

## 2010-08-17 DIAGNOSIS — I4891 Unspecified atrial fibrillation: Secondary | ICD-10-CM

## 2010-08-29 LAB — PULMONARY FUNCTION TEST

## 2010-08-30 ENCOUNTER — Encounter: Payer: Self-pay | Admitting: Internal Medicine

## 2010-08-31 ENCOUNTER — Encounter: Payer: BC Managed Care – PPO | Admitting: *Deleted

## 2010-08-31 ENCOUNTER — Ambulatory Visit (INDEPENDENT_AMBULATORY_CARE_PROVIDER_SITE_OTHER): Payer: BC Managed Care – PPO | Admitting: Internal Medicine

## 2010-08-31 ENCOUNTER — Encounter: Payer: Self-pay | Admitting: Internal Medicine

## 2010-08-31 DIAGNOSIS — I429 Cardiomyopathy, unspecified: Secondary | ICD-10-CM

## 2010-08-31 DIAGNOSIS — I495 Sick sinus syndrome: Secondary | ICD-10-CM

## 2010-08-31 DIAGNOSIS — I10 Essential (primary) hypertension: Secondary | ICD-10-CM

## 2010-08-31 DIAGNOSIS — I4891 Unspecified atrial fibrillation: Secondary | ICD-10-CM

## 2010-08-31 DIAGNOSIS — Z950 Presence of cardiac pacemaker: Secondary | ICD-10-CM

## 2010-08-31 NOTE — Patient Instructions (Signed)
Your physician wants you to follow-up in: 1 year with Dr Court Joy will receive a reminder letter in the mail two months in advance. If you don't receive a letter, please call our office to schedule the follow-up appointment.  Remote monitoring is used to monitor your Pacemaker of ICD from home. This monitoring reduces the number of office visits required to check your device to one time per year. It allows [place] to keep an eye on the functioning of your device to ensure it is working properly. You are scheduled for a device check from home on 11/30/2010. You may send your transmission at any time that day. If you have a wireless device, the transmission will be sent automatically. After your physician reviews your transmission, you will receive a postcard with your next transmission date.

## 2010-08-31 NOTE — Assessment & Plan Note (Signed)
Today his blood pressure is fairly well controlled. He notes that when he checks his blood pressure at home it appears to be fairly well-controlled. He will continue his current medications and maintain a low-sodium diet

## 2010-08-31 NOTE — Assessment & Plan Note (Signed)
At times his ventricular rate is not well controlled. It appears that he is over 110 beats per minute approximately 15% of the time. Despite this the patient is minimally if at all symptomatic. At this point I would recommend a period of watchful waiting and not make any changes to his rate control medications.

## 2010-08-31 NOTE — Progress Notes (Signed)
HPI Andrew Vance returns today for followup. He is a pleasant 75 year old man with a history of symptomatic tachybradycardia syndrome and atrial fibrillation. He also has hypertension. He is status post permanent pacemaker insertion. His atrial fibrillation now appears to be mostly chronic. He denies palpitations. He notes that he occasionally has trouble sleeping on his left side at night. He denies chest pain, shortness of breath, or peripheral edema. He remains active walking on a regular basis although he does have some arthritic problems in his knee. No Known Allergies   Current Outpatient Prescriptions  Medication Sig Dispense Refill  . budesonide-formoterol (SYMBICORT) 160-4.5 MCG/ACT inhaler Inhale 2 puffs into the lungs 2 (two) times daily.        . calcipotriene (DOVONOX) 0.005 % cream Apply topically 2 (two) times daily.        . carvedilol (COREG) 25 MG tablet Take 25 mg by mouth 2 (two) times daily with a meal.        . fish oil-omega-3 fatty acids 1000 MG capsule Take 2 g by mouth 2 (two) times daily.        . hydrocortisone 1 % cream Apply topically as needed.        . Lutein 20 MG CAPS Take by mouth at bedtime.        . Multiple Vitamin (MULTIVITAMIN) capsule Take 1 capsule by mouth daily.        . potassium chloride SA (K-DUR,KLOR-CON) 20 MEQ tablet Take 20 mEq by mouth daily.        . rosuvastatin (CRESTOR) 10 MG tablet Take 10 mg by mouth daily.        Marland Kitchen warfarin (COUMADIN) 5 MG tablet Take 5 mg by mouth as directed.        Marland Kitchen DISCONTD: ferrous sulfate 325 (65 FE) MG tablet Take 325 mg by mouth at bedtime.        Marland Kitchen DISCONTD: warfarin (COUMADIN) 5 MG tablet Take by mouth as directed.           Past Medical History  Diagnosis Date  . Psoriatic arthritis   . GERD (gastroesophageal reflux disease)   . Hypertension   . Arrhythmia     ROS:   All systems reviewed and negative except as noted in the HPI.   Past Surgical History  Procedure Date  . Total hip arthroplasty     bilateral  . Appendectomy   . Hand surgery     left hand joint surgery     Family History  Problem Relation Age of Onset  . Hypertension Other   . Heart disease Other      History   Social History  . Marital Status: Married    Spouse Name: N/A    Number of Children: N/A  . Years of Education: N/A   Occupational History  . Not on file.   Social History Main Topics  . Smoking status: Former Games developer  . Smokeless tobacco: Not on file  . Alcohol Use: Yes  . Drug Use:   . Sexually Active:    Other Topics Concern  . Not on file   Social History Narrative  . No narrative on file     BP 135/89  Pulse 67  Ht 5\' 9"  (1.753 m)  Wt 201 lb (91.173 kg)  BMI 29.68 kg/m2  Physical Exam:  Well appearing NAD HEENT: Unremarkable Neck:  No JVD, no thyromegally Lymphatics:  No adenopathy Back:  No CVA tenderness Lungs:  Clear HEART:  Iregular rate rhythm, no murmurs, no rubs, no clicks Abd:  Flat, positive bowel sounds, no organomegally, no rebound, no guarding Ext:  2 plus pulses, no edema, no cyanosis, no clubbing Skin:  No rashes no nodules Neuro:  CN II through XII intact, motor grossly intact  DEVICE  Normal device function.  See PaceArt for details.   Assess/Plan:

## 2010-08-31 NOTE — Assessment & Plan Note (Signed)
His current device is working normally. We'll recheck in several months.

## 2010-09-12 NOTE — Discharge Summary (Signed)
Andrew Vance, STURGES NO.:  [id number]   MEDICAL RECORD NO.:  [id number]          PATIENT TYPE:  INP   LOCATION:  2504                         FACILITY:  [facility]   PHYSICIAN:  Doylene Canning. Ladona Ridgel, MD    DATE OF BIRTH:  05/20/1935   DATE OF ADMISSION:  09/15/2008  DATE OF DISCHARGE:  09/18/2008                               DISCHARGE SUMMARY   PRIMARY CARDIOLOGIST:  Duke Salvia, MD, [facility]   PRIMARY CARE Shafter Jupin:  Quita Skye. Kindl, MD   DISCHARGE DIAGNOSIS:  Atrial fibrillation with rapid ventricular  response.   SECONDARY DIAGNOSES:  1. Nonischemic cardiomyopathy.  2. Tachybrady syndrome, requiring placement of a St. Jude Medical      Accent  DR [id number] dual-chamber permanent pacemaker this admission.  3. Nonobstructive coronary artery disease.  4. Class I chronic systolic congestive heart failure.  5. Hypertension.  6. Psoriatic arthritis.  7. Status post bilateral total hip arthroplasties.  8. Gastroesophageal reflux disease.   ALLERGIES:  No known drug allergies.   PROCEDURES:  Transesophageal echocardiogram showing ejection fraction 15-  20% with diffuse hypokinesis.  No evidence of left atrial appendage  thrombus.  Successful cardioversion with one biphasic 120-J delivered  energies.  Successful implantation of a St. Jude Medical Accent dual-  chamber permanent pacemaker, serial number [id number] on Sep 17, 2008.   HISTORY OF PRESENT ILLNESS:  A 75 year old Caucasian male with the above  problem list.  He was recently seen by his primary care Kalese Ensz for a  routine physical and was noted to be in atrial fibrillation with  aberrantly conducted beats.  Subsequently, saw Dr. Graciela Husbands on Sep 01, 2008,  and echocardiogram was undertaken revealing an EF of 20-25% with  severely dilated LV and mildly dilated left atrium.  With this  information, the patient subsequently underwent a cardiac  catheterization on Sep 10, 2008, revealing nonobstructive coronary  artery  disease.  It was felt that his cardiomyopathy may be tachycardia  mediated, and cardioversion was scheduled.   HOSPITAL COURSE:  The patient presented for cardioversion on May 19.  Unfortunately, his INR was subtherapeutic upon arrival at 1.7.  As a  result, the patient underwent transesophageal echocardiogram, which  showed no evidence of left atrial or left atrial appendage thrombus, and  he was successfully cardioverted with 120-J biphasic delivered energy.  With cardioversion, he had restoration of sinus rhythm, although he was  noted to be bradycardic with rates intermittently in the 30s.  He was  admitted for observation.  The patient's beta-blocker and digoxin were  held and unfortunately he remained bradycardic, and it was felt that he  would require permanent pacing.  He underwent successful placement of a  St. Jude Medical Accent DR [id number], serial number [id number] dual-chamber  permanent pacemaker on May 21.  He tolerated the procedure well and  postprocedure, his chest x-ray has shown no pneumothorax.  We will  resume his beta-blocker and digoxin and is being discharged home today  in good condition.  We will plan to follow up echocardiogram in 4-6  weeks to reevaluate EF.  The  patient, otherwise, has follow up Dr. Graciela Husbands  in our Coumadin Clinic on Wednesday, May 26.   DISCHARGE LABORATORIES:  Hemoglobin 14.1, hematocrit 40.3, WBC 4.9, and  platelets 155.  INR 2.3.   DISPOSITION:  The patient is being discharged home today in good  condition.   FOLLOWUP PLANS AND APPOINTMENTS:  As above.  The patient will follow up  with Dr. Graciela Husbands as well as our Coumadin Clinic on May 26 as previously  scheduled.  He is asked to follow up with Dr. Artis Flock as previously  scheduled.  Dr. Ladona Ridgel will see the patient in approximately 4 weeks at  which time we will schedule echocardiogram to reevaluate LV function.   DISCHARGE MEDICATIONS:  1. Coreg 3.125 mg b.i.d.  2. Digoxin 0.25 mg daily.  3.  Coumadin 5 mg daily.  4. Amlodipine 10 mg daily.  5. Penicillin 1000 mg q.6 h. (for previously diagnosed tooth abscess).  6. Crestor 10 mg daily,  7. Lisinopril HCTZ 20/25 mg daily.  8. Aleve b.i.d.  9. Dovonex cream 0.005% b.i.d. to affected area.  10.Hydrocortisone cream b.i.d. to affected area.  11.Fish oil 2000 mg b.i.d.  12.Multivitamin daily.   OUTSTANDING LABORATORY STUDIES:  None.   DURATION OF DISCHARGE/ENCOUNTER:  45 minutes including physician time.      Nicolasa Ducking, ANP      Doylene Canning. Ladona Ridgel, MD  Electronically Signed    CB/MEDQ  D:  09/18/2008  T:  09/18/2008  Job:  [id number]   cc:   Quita Skye. Artis Flock, M.D.

## 2010-09-12 NOTE — Assessment & Plan Note (Signed)
[facility]                            CARDIOLOGY OFFICE NOTE   NAME:Cragin, ROMEO                        MRN:          [id number]  DATE:08/23/2008                            DOB:          09-04-1935    Dr. Bradd Canary called today about Mr. Rutherford Alarie.  The patient was  in Dr. Blair Heys office for a general complete history and physical.  Mr.  Shiffler is 48 and works actively as a Financial planner.  He has some  psoriatic arthritis but has had no cardiac complaints.  EKG was done and  revealed atrial fibrillation with a rate of 90.  Dr. Artis Flock asked that we  see him and we were making arrangements immediately.  The patient is  stable.  He in fact will be out of town during most of this week and  would like for his office appointment to be next week that is starting  the week of Monday, Aug 30, 2008.   We will call the patient and make arrangements for his office  evaluation.   The patient had a treadmill on our office sometime in the 1990s.     Luis Abed, MD, [facility]  Electronically Signed    JDK/MedQ  DD: 08/23/2008  DT: 08/24/2008  Job #: [id number]

## 2010-09-12 NOTE — Op Note (Signed)
NAMERONDRICK, BARREIRA NO.:  [id number]   MEDICAL RECORD NO.:  [id number]          PATIENT TYPE:  OIB   LOCATION:  2921                         FACILITY:  [facility]   PHYSICIAN:  Madolyn Frieze. Jens Som, MD, FACCDATE OF BIRTH:  01/18/1936   DATE OF PROCEDURE:  09/15/2008  DATE OF DISCHARGE:                               OPERATIVE REPORT   PROCEDURE:  Cardioversion of atrial fibrillation.   PROCEDURE IN DETAIL:  The patient was sedated with propofol 50 mg  intravenously.  Synchronized cardioversion with 120 joules (biphasic)  resulted in a marked sinus bradycardia.  There were no immediate  complications.  Note, the patient's INR today was 1.7.  He was given a  dose of Lovenox in the GI suite at 2:45 p.m. prior to the procedure.  We  would recommend continuing the Lovenox at 1 mg/kg subcu q.12 h. until  his INR is greater than or equal to two.  We will continue with his  Coumadin.  We will hold his Coreg and digoxin as his heart rate was in  the 30s status post cardioversion.      Madolyn Frieze Jens Som, MD, [facility]  Electronically Signed     BSC/MEDQ  D:  09/15/2008  T:  09/16/2008  Job:  [id number]

## 2010-09-12 NOTE — Cardiovascular Report (Signed)
NAME:  Andrew Vance, Andrew Vance NO.:  [id number]   MEDICAL RECORD NO.:  [id number]          PATIENT TYPE:  OIB   LOCATION:  1961                         FACILITY:  [facility]   PHYSICIAN:  Verne Carrow, MDDATE OF BIRTH:  18-Nov-1935   DATE OF PROCEDURE:  09/10/2008  DATE OF DISCHARGE:  09/10/2008                            CARDIAC CATHETERIZATION   PRIMARY CARDIOLOGIST:  Duke Salvia, MD, [facility]   PRIMARY CARE PHYSICIAN:  Quita Skye. Kindl, MD   PROCEDURES PERFORMED:  1. Left heart catheterization.  2. Selective coronary angiography.  3. Left ventricular angiogram.  4. Right heart catheterization.   OPERATOR:  Verne Carrow, MD   INDICATIONS:  New diagnosis of cardiomyopathy in a 75 year old Caucasian  male with a history of atrial fibrillation.  This patient was referred  for diagnostic left and right heart catheterization by his primary  cardiologist, Dr. Sherryl Manges.   DETAILS OF PROCEDURE:  The patient was brought into the Outpatient  Cardiac Catheterization Laboratory after signing informed consent for  the procedure.  The right groin was prepped and draped in sterile  fashion.  An 1% lidocaine was used for local anesthesia.  A 4-French  sheath was inserted into the right femoral artery and a 6-French sheath  was inserted into the right femoral vein.  A right heart catheterization  was performed with a multipurpose catheter.  Coronary angiography was  performed with standard diagnostic catheters.  I actually had to upsize  to a JL-5 for the injection of the left coronary system.  A pigtail  catheter was used to perform a left ventricular angiogram.  There was no  significant pressure gradient noted across the aortic valve with  pullback of the catheter.  The patient tolerated the procedure well and  was taken to the holding area in stable condition.   HEMODYNAMIC FINDINGS:  1. Central aortic pressure 112/63.  2. Right atrial pressure 5.  3. Right  ventricular pressure 23/2.  4. Pulmonary artery pressure 21/8 with a mean of 14.  5. Pulmonary capillary wedge pressure mean of 6.  6. Left ventricular pressure 104/10.  7. Left ventricular end-diastolic pressure 11.  8. Central aortic saturation 98%.  9. Pulmonary artery saturation 68%.  10.Cardiac output 4.1 L/min.  11.Cardiac index 1.9 L/min/sq. m (by Fick method).   ANGIOGRAPHIC FINDINGS:  1. The left main coronary artery has no significant stenosis.  2. The left anterior descending is a large vessel that courses to the      apex and gives off 3 diagonal branches.  There are mild luminal      irregularities noted in the proximal and midportion of the LAD.      First diagonal is a moderate-to-large size vessel with mild plaque      disease.  Second diagonal is small in caliber.  The third diagonal      is moderate size with plaque disease only.  3. The circumflex artery gives off moderate-sized first and second      obtuse marginal branches that are free of any significant disease.      The third obtuse  marginal branch is a moderate-to-large-sized      vessel that has mild luminal irregularities.  4. The right coronary artery is a moderate-sized vessel that has a      proximal 30-40% stenosis.  There are no other flow-limiting lesions      noted in the right coronary artery.  This appears to be a      codominant system.  5. Left ventricular angiogram was performed in the RAO projection and      shows global left ventricular systolic dysfunction with an ejection      fraction of 20%.   IMPRESSION:  1. Mild nonobstructive coronary artery disease.  2. Global left ventricular systolic dysfunction.  3. Normal right-sided filling pressures.   RECOMMENDATIONS:  I recommend continued medical management.  The patient  has already been started on ACE inhibitor and a beta-blocker.  With his  mild nonobstructive disease, he should also be managed in the future on  a statin agent.  I will  leave this to his primary cardiologist to change  his medical therapy at this time.      Verne Carrow, MD  Electronically Signed     CM/MEDQ  D:  09/10/2008  T:  09/11/2008  Job:  [id number]   cc:   Duke Salvia, MD, [facility]  Quita Skye. Artis Flock, M.D.

## 2010-09-12 NOTE — Op Note (Signed)
Andrew Vance, BRENNEMAN NO.:  [id number]   MEDICAL RECORD NO.:  [id number]          PATIENT TYPE:  INP   LOCATION:  2504                         FACILITY:  [facility]   PHYSICIAN:  Doylene Canning. Ladona Ridgel, MD    DATE OF BIRTH:  Jul 13, 1935   DATE OF PROCEDURE:  09/17/2008  DATE OF DISCHARGE:                               OPERATIVE REPORT   PROCEDURE PERFORMED:  Implantation of a dual-chamber pacemaker.   INDICATIONS:  Symptomatic tachy-brady syndrome.   The patient is a 75 year old man who presented to the hospital with  congestive heart failure and atrial fibrillation with a rapid  ventricular response.  He was asymptomatic.  The patient subsequently  found to have an EF of 20%.  He underwent TE-guided DC cardioversion  with restoration of sinus rhythm several days ago.  He remained quite  bradycardic with heart rates in the 30s and is now referred for  permanent pacemaker insertion.   PROCEDURE IN DETAILS:  After informed consent was obtained, the patient  was taken to diagnostic EP lab in the fasting state.  After usual  preparation and draping, intravenous fentanyl and midazolam was given  for sedation.  Lidocaine 30 mL was infiltrated into left infraclavicular  region.  A 5-cm incision was carried out over this region.  Electrocautery was utilized to dissect down to the fascial plane.  Attempts to puncture the left subclavian vein were unsuccessful.  At  this point, the cephalic vein was dissected free of the subcutaneous  tissue and the St. Jude model 2088T 58-cm active fixation pacing lead,  serial #[id number] was advanced by way of the cephalic vein utilizing a  Glidewire for sheath access into the right atrium.  In addition, the St.  Jude model 2088T 52-cm active fixation pacing lead, serial #[id number]  was advanced into the right atrium by way of a 7-French sheath, also  utilizing a Glidewire for cannulation of the cephalic vein, subclavian  vein, and advanced it  into the right atrium.  Having accomplished this,  attention was then turned to mapping the right ventricular lead.  At the  far side on the RV apical septum, the R-waves were 14, the impedance was  868 ohms, the threshold was 0.6 volts at 0.5 msec, and a prominent  injury current was noted with active fixation of the lead.  With the  ventricular lead in satisfactory position, attention was then turned to  placement of the atrial leads.  It was placed in the anterolateral  portion of the right atrium where P-waves measured 3 mV, pacing  threshold 0.5 at 0.5, with the lead actively fixed, and the pacing  impedance was 481 ohms.  An injury current was present, however, was not  prominent.  A 10-volt pacing did not stimulate the diaphragm either in  the atrium or the ventricle.  With these satisfactory parameters, the  lead was secured to subpectoralis fascia with a figure-of-eight silk  suture.  The sewing sleeve also secured with silk suture.  Electrocautery was utilized to make subcutaneous pocket.  Gentamicin  irrigation was utilized to irrigate the pocket  and electrocautery was  utilized to assure hemostasis.  The St. Jude Accent dual-chamber  pacemaker, serial [id number] was connected to the atrial and ventricular  leads and placed back in the subcutaneous pocket.  Generator was secured  with silk suture.  The pocket was irrigated with kanamycin and the  incision closed with 2-0 Vicryl and 3-0 Vicryl.  Benzoin was painted on  the skin.  Steri-Strips were applied and pressure was then placed.  The  patient was returned to his room in satisfactory condition.   COMPLICATIONS:  There were no immediate procedure complications.   RESULTS:  This demonstrates successful implantation of a St. Jude dual-  chamber pacemaker in a patient with symptomatic bradycardia and tachy-  brady syndrome and paroxysmal AFib.      Doylene Canning. Ladona Ridgel, MD  Electronically Signed     GWT/MEDQ  D:  09/17/2008   T:  09/18/2008  Job:  [id number]   cc:   Madolyn Frieze. Jens Som, MD, [facility]

## 2010-09-12 NOTE — H&P (Signed)
NAME:  Andrew Vance, GOETTING NO.:  [id number]   MEDICAL RECORD NO.:  [id number]          PATIENT TYPE:  OIB   LOCATION:  2921                         FACILITY:  [facility]   PHYSICIAN:  Duke Salvia, MD, FACCDATE OF BIRTH:  January 28, 1936   DATE OF ADMISSION:  09/15/2008  DATE OF DISCHARGE:                              HISTORY & PHYSICAL   Briefly, the patient is presenting for cardioversion and is found to be  subtherapeutic in his INR.   HISTORY OF PRESENT ILLNESS:  [name] is a 75 year old male.  He has a  new diagnosis of atrial fibrillation.  This was diagnosed 2-3 weeks ago  by his primary caregiver, Dr. Artis Flock, during a routine physical  examination.  The patient was started on Coumadin, referred to Dr.  Graciela Husbands.  Dr. Graciela Husbands in consultation recommended the patient have a direct  current cardioversion.  This was set up for today, Sep 15, 2008.  The  patient's Coumadin is subtherapeutic.  He had a transesophageal  echocardiogram which showed no presence of left atrial appendage  thrombus.  The patient was cardioverted successfully to a sinus  bradycardia.  He will go to a step-down unit for observation overnight.   PAST MEDICAL HISTORY:  As regards to the patient's atrial fibrillation  diagnosed 2-3 weeks ago, he had subsequent echocardiogram on Sep 03, 2008.  Study showed that the left atrium is mildly dilated.  The left  ventricle is severely dilated, ejection fraction 20-25%, mild mitral  regurgitation.  Due to presence of cardiomyopathy, the patient had a  left heart catheterization.  This study showed that the LAD and the left  circumflex had luminal irregularities.  The right coronary artery had a  proximal 30-40% stenosis, ejection fraction estimated at 20%.  This is a  nonischemic cardiomyopathy.  The patient has New York Heart Association  class is I chronic systolic congestive heart failure.  The patient has  no problems with inclines.  He can walk at will.  He  does not mention  getting short of breath with exertion.  This is possibly a tachycardia-  mediated cardiomyopathy.  The patient after catheterization was  continued on his ACE inhibitor and started on Coreg, digoxin, and  Crestor.   PHYSICAL EXAMINATION:  VITAL SIGNS:  After the cardioversion,  temperature 98.3, blood pressure 122/74, pulse is 75 and regular,  respiration 19, oxygen saturation is 98% on 2 L.  The patient is alert  and oriented x3 in no acute distress.  NECK:  Carotid bruits not auscultated.  Carotid upstrokes 4+  bilaterally.  LUNGS:  Clear to auscultation bilaterally.  HEART:  Regular rate and rhythm without murmur.  Telemetry showing sinus  rhythm and sinus bradycardia.  ABDOMEN:  Obese, soft, nondistended.  Bowel sounds are present.  EXTREMITIES:  No evidence of cyanosis, clubbing, or edema.  Essentially,  the patient is asymptomatic with atrial fibrillation.   Past medical history includes:  1. Hypertension.  2. Psoriatic arthritis.  3. Bilateral total hip arthroplasties.  4. New diagnosis of atrial fibrillation.   The patient has no known drug  allergies.  The patient will be  transferred to a step-down unit.   DIET:  Heart healthy.   ACTIVITY:  Up ad lib.   He will have Lovenox subcutaneous dosing per pharmacy.  The first dose  was given at 2:49 p.m. on Sep 15, 2008.  He will have daily PT/INR.   Medications are as follows:  1. Lisinopril 20 mg daily.  2. Hydrochlorothiazide 25 mg daily.  3. Amlodipine 10 mg daily.  4. Multivitamin daily.  5. Coumadin dosing per pharmacy, the patient takes 10 mg at home.  6. Coreg 3.125 mg twice daily on hold.  7. Digoxin 0.25 mg daily on hold.  8. Naprosyn 220 mg daily.  9. Dovonex 0.005% cream, apply daily.   Cardiology and p.r.n.'s will go to the chart.       Andrew Vance, [place]      Duke Salvia, MD, [facility]  Electronically Signed    GM/MEDQ  D:  09/15/2008  T:  09/16/2008  Job:  [id number]

## 2010-09-14 ENCOUNTER — Ambulatory Visit (INDEPENDENT_AMBULATORY_CARE_PROVIDER_SITE_OTHER): Payer: BC Managed Care – PPO | Admitting: *Deleted

## 2010-09-14 DIAGNOSIS — I4891 Unspecified atrial fibrillation: Secondary | ICD-10-CM

## 2010-09-14 MED ORDER — WARFARIN SODIUM 5 MG PO TABS: 5.0000 mg | ORAL_TABLET | ORAL | Status: DC

## 2010-10-10 ENCOUNTER — Other Ambulatory Visit: Payer: Self-pay | Admitting: *Deleted

## 2010-10-10 MED ORDER — ROSUVASTATIN CALCIUM 10 MG PO TABS: 10.0000 mg | ORAL_TABLET | Freq: Every day | ORAL | Status: DC

## 2010-10-12 ENCOUNTER — Ambulatory Visit (INDEPENDENT_AMBULATORY_CARE_PROVIDER_SITE_OTHER): Payer: BC Managed Care – PPO | Admitting: *Deleted

## 2010-10-12 DIAGNOSIS — I4891 Unspecified atrial fibrillation: Secondary | ICD-10-CM

## 2010-10-12 LAB — POCT INR: INR: 2.9

## 2010-10-17 ENCOUNTER — Other Ambulatory Visit: Payer: Self-pay | Admitting: *Deleted

## 2010-10-17 MED ORDER — POTASSIUM CHLORIDE CRYS ER 20 MEQ PO TBCR: 20.0000 meq | EXTENDED_RELEASE_TABLET | Freq: Every day | ORAL | Status: DC

## 2010-11-09 ENCOUNTER — Ambulatory Visit (INDEPENDENT_AMBULATORY_CARE_PROVIDER_SITE_OTHER): Payer: BC Managed Care – PPO | Admitting: *Deleted

## 2010-11-09 DIAGNOSIS — I4891 Unspecified atrial fibrillation: Secondary | ICD-10-CM

## 2010-11-09 LAB — POCT INR: INR: 2.8

## 2010-11-30 ENCOUNTER — Ambulatory Visit (INDEPENDENT_AMBULATORY_CARE_PROVIDER_SITE_OTHER): Payer: BC Managed Care – PPO | Admitting: *Deleted

## 2010-11-30 DIAGNOSIS — I495 Sick sinus syndrome: Secondary | ICD-10-CM

## 2010-11-30 DIAGNOSIS — I429 Cardiomyopathy, unspecified: Secondary | ICD-10-CM

## 2010-11-30 DIAGNOSIS — I4891 Unspecified atrial fibrillation: Secondary | ICD-10-CM

## 2010-12-05 ENCOUNTER — Encounter: Payer: Self-pay | Admitting: Internal Medicine

## 2010-12-05 ENCOUNTER — Other Ambulatory Visit: Payer: Self-pay | Admitting: Internal Medicine

## 2010-12-05 LAB — REMOTE PACEMAKER DEVICE
BAMS-0001: 150 {beats}/min
BAMS-0003: 70 {beats}/min
BRDY-0002RV: 60 {beats}/min
BRDY-0003RV: 90 {beats}/min
DEVICE MODEL PM: 2276567
RV LEAD AMPLITUDE: 12 mv
RV LEAD IMPEDENCE PM: 610 Ohm

## 2010-12-06 NOTE — Progress Notes (Signed)
Pacer remote check  

## 2010-12-07 ENCOUNTER — Ambulatory Visit (INDEPENDENT_AMBULATORY_CARE_PROVIDER_SITE_OTHER): Payer: BC Managed Care – PPO | Admitting: *Deleted

## 2010-12-07 DIAGNOSIS — I4891 Unspecified atrial fibrillation: Secondary | ICD-10-CM

## 2010-12-15 ENCOUNTER — Encounter: Payer: Self-pay | Admitting: *Deleted

## 2010-12-29 ENCOUNTER — Ambulatory Visit (INDEPENDENT_AMBULATORY_CARE_PROVIDER_SITE_OTHER): Payer: BC Managed Care – PPO | Admitting: *Deleted

## 2010-12-29 DIAGNOSIS — I4891 Unspecified atrial fibrillation: Secondary | ICD-10-CM

## 2010-12-29 LAB — POCT INR: INR: 2.8

## 2010-12-29 MED ORDER — WARFARIN SODIUM 5 MG PO TABS: ORAL_TABLET | ORAL | Status: DC

## 2011-01-26 ENCOUNTER — Ambulatory Visit (INDEPENDENT_AMBULATORY_CARE_PROVIDER_SITE_OTHER): Payer: BC Managed Care – PPO | Admitting: *Deleted

## 2011-01-26 DIAGNOSIS — I4891 Unspecified atrial fibrillation: Secondary | ICD-10-CM

## 2011-01-26 LAB — POCT INR: INR: 3.5

## 2011-02-07 ENCOUNTER — Other Ambulatory Visit: Payer: Self-pay | Admitting: *Deleted

## 2011-02-07 MED ORDER — CARVEDILOL 25 MG PO TABS: 25.0000 mg | ORAL_TABLET | Freq: Two times a day (BID) | ORAL | Status: DC

## 2011-02-08 ENCOUNTER — Telehealth: Payer: Self-pay | Admitting: Internal Medicine

## 2011-02-08 NOTE — Telephone Encounter (Signed)
Pt is going to have dental procedure and he wants to know how many days he has to go without coumadin

## 2011-02-08 NOTE — Telephone Encounter (Signed)
Agree 

## 2011-02-08 NOTE — Telephone Encounter (Signed)
I spoke with the patient. He is to have one tooth extracted and his dentist recommended holding coumadin prior to having this done. He states he has done this before. He has a-fib without a history of stroke. I will review with Dr. Graciela Husbands and call the patient back. He is agreeable.

## 2011-02-09 NOTE — Telephone Encounter (Signed)
I left a message for the patient that he may hold his coumadin for up to 5 days as needed for tooth extraction. I advised he check with his dentist since they may be able to proceed after about 2-3 days holding coumadin. I also instructed that he should resume coumadin ASAP after procedure when his dentist feels is safe.

## 2011-02-23 ENCOUNTER — Ambulatory Visit (INDEPENDENT_AMBULATORY_CARE_PROVIDER_SITE_OTHER): Payer: BC Managed Care – PPO | Admitting: *Deleted

## 2011-02-23 DIAGNOSIS — I4891 Unspecified atrial fibrillation: Secondary | ICD-10-CM

## 2011-02-23 LAB — POCT INR: INR: 2.1

## 2011-03-08 ENCOUNTER — Ambulatory Visit (INDEPENDENT_AMBULATORY_CARE_PROVIDER_SITE_OTHER): Payer: BC Managed Care – PPO | Admitting: *Deleted

## 2011-03-08 DIAGNOSIS — I495 Sick sinus syndrome: Secondary | ICD-10-CM

## 2011-03-08 DIAGNOSIS — Z950 Presence of cardiac pacemaker: Secondary | ICD-10-CM

## 2011-03-09 ENCOUNTER — Other Ambulatory Visit: Payer: Self-pay | Admitting: Internal Medicine

## 2011-03-09 ENCOUNTER — Encounter: Payer: Self-pay | Admitting: Internal Medicine

## 2011-03-09 LAB — REMOTE PACEMAKER DEVICE
BAMS-0001: 150 {beats}/min
BAMS-0003: 70 {beats}/min
BATTERY VOLTAGE: 2.96 V
BRDY-0004RV: 110 {beats}/min
RV LEAD AMPLITUDE: 12 mv
RV LEAD IMPEDENCE PM: 580 Ohm

## 2011-03-16 ENCOUNTER — Ambulatory Visit (INDEPENDENT_AMBULATORY_CARE_PROVIDER_SITE_OTHER): Payer: BC Managed Care – PPO | Admitting: *Deleted

## 2011-03-16 DIAGNOSIS — I4891 Unspecified atrial fibrillation: Secondary | ICD-10-CM

## 2011-03-16 LAB — POCT INR: INR: 3.3

## 2011-03-16 NOTE — Progress Notes (Signed)
Remote pacer check  

## 2011-03-23 ENCOUNTER — Encounter: Payer: BC Managed Care – PPO | Admitting: *Deleted

## 2011-03-23 ENCOUNTER — Encounter: Payer: Self-pay | Admitting: *Deleted

## 2011-04-06 ENCOUNTER — Ambulatory Visit (INDEPENDENT_AMBULATORY_CARE_PROVIDER_SITE_OTHER): Payer: BC Managed Care – PPO | Admitting: *Deleted

## 2011-04-06 DIAGNOSIS — I4891 Unspecified atrial fibrillation: Secondary | ICD-10-CM

## 2011-05-04 ENCOUNTER — Ambulatory Visit (INDEPENDENT_AMBULATORY_CARE_PROVIDER_SITE_OTHER): Payer: BC Managed Care – PPO | Admitting: *Deleted

## 2011-05-04 DIAGNOSIS — I4891 Unspecified atrial fibrillation: Secondary | ICD-10-CM

## 2011-06-01 ENCOUNTER — Ambulatory Visit (INDEPENDENT_AMBULATORY_CARE_PROVIDER_SITE_OTHER): Payer: BC Managed Care – PPO

## 2011-06-01 DIAGNOSIS — I4891 Unspecified atrial fibrillation: Secondary | ICD-10-CM

## 2011-06-01 LAB — POCT INR: INR: 1.8

## 2011-06-04 ENCOUNTER — Other Ambulatory Visit: Payer: Self-pay | Admitting: Internal Medicine

## 2011-06-07 ENCOUNTER — Ambulatory Visit (INDEPENDENT_AMBULATORY_CARE_PROVIDER_SITE_OTHER): Payer: BC Managed Care – PPO | Admitting: *Deleted

## 2011-06-07 DIAGNOSIS — I4891 Unspecified atrial fibrillation: Secondary | ICD-10-CM

## 2011-06-07 DIAGNOSIS — Z950 Presence of cardiac pacemaker: Secondary | ICD-10-CM

## 2011-06-08 ENCOUNTER — Other Ambulatory Visit: Payer: Self-pay

## 2011-06-08 ENCOUNTER — Encounter: Payer: Self-pay | Admitting: Internal Medicine

## 2011-06-08 LAB — REMOTE PACEMAKER DEVICE
BAMS-0001: 150 {beats}/min
BAMS-0003: 70 {beats}/min
BRDY-0003RV: 90 {beats}/min
BRDY-0004RV: 110 {beats}/min
DEVICE MODEL PM: 2276567
RV LEAD AMPLITUDE: 12 mv
RV LEAD IMPEDENCE PM: 580 Ohm

## 2011-06-08 MED ORDER — CARVEDILOL 25 MG PO TABS: 25.0000 mg | ORAL_TABLET | Freq: Two times a day (BID) | ORAL | Status: DC

## 2011-06-13 ENCOUNTER — Encounter: Payer: Self-pay | Admitting: Family Medicine

## 2011-06-13 ENCOUNTER — Ambulatory Visit (INDEPENDENT_AMBULATORY_CARE_PROVIDER_SITE_OTHER): Payer: BC Managed Care – PPO | Admitting: Family Medicine

## 2011-06-13 VITALS — BP 140/88 | HR 81 | Temp 98.1°F | Ht 68.5 in | Wt 196.0 lb

## 2011-06-13 DIAGNOSIS — N400 Enlarged prostate without lower urinary tract symptoms: Secondary | ICD-10-CM

## 2011-06-13 DIAGNOSIS — L4050 Arthropathic psoriasis, unspecified: Secondary | ICD-10-CM

## 2011-06-13 DIAGNOSIS — K922 Gastrointestinal hemorrhage, unspecified: Secondary | ICD-10-CM

## 2011-06-13 DIAGNOSIS — I4891 Unspecified atrial fibrillation: Secondary | ICD-10-CM

## 2011-06-13 DIAGNOSIS — I10 Essential (primary) hypertension: Secondary | ICD-10-CM

## 2011-06-13 DIAGNOSIS — E785 Hyperlipidemia, unspecified: Secondary | ICD-10-CM

## 2011-06-13 MED ORDER — TAMSULOSIN HCL 0.4 MG PO CAPS: 0.4000 mg | ORAL_CAPSULE | Freq: Every day | ORAL | Status: DC

## 2011-06-13 MED ORDER — LISINOPRIL 10 MG PO TABS: 10.0000 mg | ORAL_TABLET | Freq: Every day | ORAL | Status: DC

## 2011-06-13 MED ORDER — CALCIPOTRIENE 0.005 % EX CREA: TOPICAL_CREAM | Freq: Two times a day (BID) | CUTANEOUS | Status: DC

## 2011-06-14 ENCOUNTER — Encounter: Payer: Self-pay | Admitting: Family Medicine

## 2011-06-14 DIAGNOSIS — K922 Gastrointestinal hemorrhage, unspecified: Secondary | ICD-10-CM | POA: Insufficient documentation

## 2011-06-14 NOTE — Progress Notes (Signed)
  Subjective:    Patient ID: Andrew Vance, male    DOB: [birth year], 76 y.o.   MRN: [id number]  HPI 76 yr old male to establish with [place] after transfering from Dr. Leslee Home. He has several issues to discuss. First he follows his BP closely, and it has been elevated in the range of 140s to 150s over 90s for the past 6 months. He is on Coreg already, and this is to cover both HTN and a hx of tachy-brady syndrome. He is active, and he denies any SOB or chest pains. He is on Coumadin for atrial fibrillation, and he is followed closely by Dr. Graciela Husbands or Dr. Ladona Ridgel. Also he describes some urgency to urinate with a slow stream, and he has nocturia times 2 or 3. He had a cpx about 11 months ago and was told his prostate was okay.    Review of Systems  Constitutional: Negative.   Respiratory: Negative.   Cardiovascular: Negative.   Genitourinary: Positive for urgency, frequency and difficulty urinating.  Musculoskeletal: Positive for arthralgias.       Objective:   Physical Exam  Constitutional: He appears well-developed and well-nourished.  Neck: No thyromegaly present.  Cardiovascular: Normal rate, normal heart sounds and intact distal pulses.  Exam reveals no gallop and no friction rub.   No murmur heard.      Irregularly irregular rhythm   Pulmonary/Chest: Effort normal and breath sounds normal. No respiratory distress. He has no wheezes. He has no rales. He exhibits no tenderness.  Musculoskeletal:       His hands show stiffened fingers and knuckles with tenderness and decreased ROM  Lymphadenopathy:    He has no cervical adenopathy.  Skin:       Fingernails are pitted           Assessment & Plan:  Introductory visit for this man with poorly controlled HTN. We will add Lisinopril 10 mg a day to his regimen. His atrial fib seems to be well controlled. Try Flomax for probable BPH symptoms. He will return in one month for a cpx and labs.

## 2011-06-15 NOTE — Progress Notes (Signed)
Pacer remote 

## 2011-06-27 ENCOUNTER — Encounter: Payer: Self-pay | Admitting: *Deleted

## 2011-06-29 ENCOUNTER — Ambulatory Visit (INDEPENDENT_AMBULATORY_CARE_PROVIDER_SITE_OTHER): Payer: BC Managed Care – PPO | Admitting: Pharmacist

## 2011-06-29 DIAGNOSIS — I4891 Unspecified atrial fibrillation: Secondary | ICD-10-CM

## 2011-06-29 LAB — POCT INR: INR: 2.3

## 2011-07-18 ENCOUNTER — Other Ambulatory Visit (INDEPENDENT_AMBULATORY_CARE_PROVIDER_SITE_OTHER): Payer: BC Managed Care – PPO

## 2011-07-18 DIAGNOSIS — Z0000 Encounter for general adult medical examination without abnormal findings: Secondary | ICD-10-CM

## 2011-07-18 LAB — TSH: TSH: 0.88 u[IU]/mL (ref 0.35–5.50)

## 2011-07-18 LAB — CBC WITH DIFFERENTIAL/PLATELET
Basophils Relative: 0.4 % (ref 0.0–3.0)
Eosinophils Absolute: 0.1 10*3/uL (ref 0.0–0.7)
HCT: 44.7 % (ref 39.0–52.0)
Hemoglobin: 15 g/dL (ref 13.0–17.0)
Lymphocytes Relative: 16.7 % (ref 12.0–46.0)
MCHC: 33.6 g/dL (ref 30.0–36.0)
MCV: 94.9 fl (ref 78.0–100.0)
Monocytes Absolute: 0.6 10*3/uL (ref 0.1–1.0)
Neutro Abs: 4.1 10*3/uL (ref 1.4–7.7)
RBC: 4.71 Mil/uL (ref 4.22–5.81)

## 2011-07-18 LAB — POCT URINALYSIS DIPSTICK
Glucose, UA: NEGATIVE
Nitrite, UA: NEGATIVE
Spec Grav, UA: 1.01
Urobilinogen, UA: 0.2

## 2011-07-18 LAB — BASIC METABOLIC PANEL
CO2: 26 mEq/L (ref 19–32)
Chloride: 106 mEq/L (ref 96–112)
Sodium: 140 mEq/L (ref 135–145)

## 2011-07-18 LAB — HEPATIC FUNCTION PANEL
Albumin: 3.8 g/dL (ref 3.5–5.2)
Alkaline Phosphatase: 68 U/L (ref 39–117)
Total Protein: 6.5 g/dL (ref 6.0–8.3)

## 2011-07-18 LAB — LIPID PANEL: HDL: 44.4 mg/dL (ref >39.00)

## 2011-07-19 NOTE — Progress Notes (Signed)
Quick Note:  Pt aware ______ 

## 2011-07-25 ENCOUNTER — Encounter: Payer: Self-pay | Admitting: Family Medicine

## 2011-07-25 ENCOUNTER — Ambulatory Visit (INDEPENDENT_AMBULATORY_CARE_PROVIDER_SITE_OTHER): Payer: BC Managed Care – PPO | Admitting: Family Medicine

## 2011-07-25 VITALS — BP 122/84 | HR 74 | Temp 98.4°F | Wt 197.0 lb

## 2011-07-25 DIAGNOSIS — Z0000 Encounter for general adult medical examination without abnormal findings: Secondary | ICD-10-CM

## 2011-07-25 NOTE — Progress Notes (Signed)
  Subjective:    Patient ID: Andrew Vance, male    DOB: December 10, 1935, 76 y.o.   MRN: [id number]  HPI 76 yr old male for a cpx. He feels well and has no concerns. He is due to see the pacemaker clinic in the next week or so.    Review of Systems  Constitutional: Negative.   HENT: Negative.   Eyes: Negative.   Respiratory: Negative.   Cardiovascular: Negative.   Gastrointestinal: Negative.   Genitourinary: Negative.   Musculoskeletal: Negative.   Skin: Negative.   Neurological: Negative.   Hematological: Negative.   Psychiatric/Behavioral: Negative.        Objective:   Physical Exam  Constitutional: He is oriented to person, place, and time. He appears well-developed and well-nourished. No distress.  HENT:  Head: Normocephalic and atraumatic.  Right Ear: External ear normal.  Left Ear: External ear normal.  Nose: Nose normal.  Mouth/Throat: Oropharynx is clear and moist. No oropharyngeal exudate.  Eyes: Conjunctivae and EOM are normal. Pupils are equal, round, and reactive to light. Right eye exhibits no discharge. Left eye exhibits no discharge. No scleral icterus.  Neck: Neck supple. No JVD present. No tracheal deviation present. No thyromegaly present.  Cardiovascular: Normal rate, regular rhythm, normal heart sounds and intact distal pulses.  Exam reveals no gallop and no friction rub.   No murmur heard. Pulmonary/Chest: Effort normal and breath sounds normal. No respiratory distress. He has no wheezes. He has no rales. He exhibits no tenderness.  Abdominal: Soft. Bowel sounds are normal. He exhibits no distension and no mass. There is no tenderness. There is no rebound and no guarding.  Genitourinary: Rectum normal, prostate normal and penis normal. Guaiac negative stool. No penile tenderness.  Musculoskeletal: Normal range of motion. He exhibits no edema and no tenderness.  Lymphadenopathy:    He has no cervical adenopathy.  Neurological: He is alert and oriented to  person, place, and time. He has normal reflexes. No cranial nerve deficit. He exhibits normal muscle tone. Coordination normal.  Skin: Skin is warm and dry. No rash noted. He is not diaphoretic. No erythema. No pallor.  Psychiatric: He has a normal mood and affect. His behavior is normal. Judgment and thought content normal.          Assessment & Plan:  Well exam.

## 2011-07-30 ENCOUNTER — Ambulatory Visit (INDEPENDENT_AMBULATORY_CARE_PROVIDER_SITE_OTHER): Payer: BC Managed Care – PPO | Admitting: *Deleted

## 2011-07-30 DIAGNOSIS — I4891 Unspecified atrial fibrillation: Secondary | ICD-10-CM

## 2011-07-30 LAB — POCT INR: INR: 2.1

## 2011-08-28 ENCOUNTER — Ambulatory Visit (INDEPENDENT_AMBULATORY_CARE_PROVIDER_SITE_OTHER): Payer: BC Managed Care – PPO | Admitting: Pharmacist

## 2011-08-28 DIAGNOSIS — I4891 Unspecified atrial fibrillation: Secondary | ICD-10-CM

## 2011-09-05 ENCOUNTER — Encounter: Payer: Self-pay | Admitting: Internal Medicine

## 2011-09-05 ENCOUNTER — Ambulatory Visit (INDEPENDENT_AMBULATORY_CARE_PROVIDER_SITE_OTHER): Payer: BC Managed Care – PPO | Admitting: Internal Medicine

## 2011-09-05 VITALS — BP 138/88 | HR 70 | Ht 69.0 in | Wt 197.4 lb

## 2011-09-05 DIAGNOSIS — I4891 Unspecified atrial fibrillation: Secondary | ICD-10-CM

## 2011-09-05 DIAGNOSIS — Z950 Presence of cardiac pacemaker: Secondary | ICD-10-CM

## 2011-09-05 DIAGNOSIS — I429 Cardiomyopathy, unspecified: Secondary | ICD-10-CM

## 2011-09-05 DIAGNOSIS — I495 Sick sinus syndrome: Secondary | ICD-10-CM

## 2011-09-05 LAB — PACEMAKER DEVICE OBSERVATION
BAMS-0001: 150 {beats}/min
BATTERY VOLTAGE: 2.9629 V
RV LEAD AMPLITUDE: 12 mv
RV LEAD IMPEDENCE PM: 587.5 Ohm
VENTRICULAR PACING PM: 56

## 2011-09-05 NOTE — Patient Instructions (Signed)
Your physician wants you to follow-up in: 12 months with Dr Ladona Ridgel Bonita Quin will receive a reminder letter in the mail two months in advance. If you don't receive a letter, please call our office to schedule the follow-up appointment.   Remote monitoring is used to monitor your Pacemaker of ICD from home. This monitoring reduces the number of office visits required to check your device to one time per year. It allows [place] to keep an eye on the functioning of your device to ensure it is working properly. You are scheduled for a device check from home on 12/13/11. You may send your transmission at any time that day. If you have a wireless device, the transmission will be sent automatically. After your physician reviews your transmission, you will receive a postcard with your next transmission date.

## 2011-09-05 NOTE — Assessment & Plan Note (Signed)
His pacemaker is working normally. We'll plan to recheck in several months. 

## 2011-09-05 NOTE — Assessment & Plan Note (Signed)
His ventricular rate appears to be well-controlled. He will continue his current medical therapy. 

## 2011-09-05 NOTE — Progress Notes (Signed)
HPI [name] returns today for followup. He is a pleasant 76 year old man with a history of arthritis and symptomatic bradycardia, status post permanent pacemaker insertion. He also has hypertension and atrial fibrillation. His ventricular rate appears to be well-controlled. He denies chest pain or shortness of breath. He remains active. No syncope. No Known Allergies   Current Outpatient Prescriptions  Medication Sig Dispense Refill  . budesonide-formoterol (SYMBICORT) 160-4.5 MCG/ACT inhaler Inhale 2 puffs into the lungs as needed.       . calcipotriene (DOVONOX) 0.005 % cream Apply topically 2 (two) times daily.  60 g  11  . carvedilol (COREG) 25 MG tablet Take 1 tablet (25 mg total) by mouth 2 (two) times daily with a meal.  60 tablet  3  . fish oil-omega-3 fatty acids 1000 MG capsule Take 2 g by mouth 2 (two) times daily.        . hydrocortisone 1 % cream Apply topically as needed.        Marland Kitchen lisinopril (PRINIVIL,ZESTRIL) 10 MG tablet Take 1 tablet (10 mg total) by mouth daily.  30 tablet  11  . Lutein 20 MG CAPS Take by mouth at bedtime.        . Multiple Vitamin (MULTIVITAMIN) capsule Take 1 capsule by mouth daily.        . potassium chloride SA (K-DUR,KLOR-CON) 20 MEQ tablet Take 1 tablet (20 mEq total) by mouth daily.  60 tablet  6  . rosuvastatin (CRESTOR) 10 MG tablet Take 1 tablet (10 mg total) by mouth daily.  90 tablet  3  . Tamsulosin HCl (FLOMAX) 0.4 MG CAPS Take 1 capsule (0.4 mg total) by mouth daily.  30 capsule  11  . warfarin (COUMADIN) 5 MG tablet          Past Medical History  Diagnosis Date  . Psoriatic arthritis   . GERD (gastroesophageal reflux disease)   . Hypertension   . Atrial fibrillation     sees Dr. Sherryl Manges  . Tachycardia-bradycardia syndrome   . Anemia, iron deficiency   . GI bleed 2012    from colonic diverticulae  . Hyperlipidemia     ROS:   All systems reviewed and negative except as noted in the HPI.   Past Surgical History    Procedure Date  . Total hip arthroplasty 1998 and 2001    bilateral, sees Dr. Wyline Mood   . Appendectomy   . Hand surgery 1978    right hand, fusions to DIPs and PIPs of fingers 1,2,4,and 5  . Colonoscopy 05-27-10    per Dr, Christella Hartigan, extensive diverticulosis, repeat in 5 yrs   . Esophagogastroduodenoscopy 05-27-10    per Dr. Christella Hartigan, normal   . Cardiac catheterization 09/10/08     Family History  Problem Relation Age of Onset  . Hypertension Other   . Heart disease Other      History   Social History  . Marital Status: Married    Spouse Name: N/A    Number of Children: N/A  . Years of Education: N/A   Occupational History  . Not on file.   Social History Main Topics  . Smoking status: Former Games developer  . Smokeless tobacco: Former Neurosurgeon  . Alcohol Use: 0.5 oz/week    1 drink(s) per week  . Drug Use: No  . Sexually Active: Not on file   Other Topics Concern  . Not on file   Social History Narrative  . No narrative on file  BP 138/88  Pulse 70  Ht 5\' 9"  (1.753 m)  Wt 197 lb 6.4 oz (89.54 kg)  BMI 29.15 kg/m2  Physical Exam:  Well appearing 76 year old man, NAD HEENT: Unremarkable Neck:  No JVD, no thyromegally Lungs:  Clear with no wheezes, rales, or rhonchi. HEART:  Regular rate rhythm, no murmurs, no rubs, no clicks Abd:  soft, positive bowel sounds, no organomegally, no rebound, no guarding Ext:  2 plus pulses, no edema, no cyanosis, no clubbing Skin:  No rashes no nodules Neuro:  CN II through XII intact, motor grossly intact  DEVICE  Normal device function.  See PaceArt for details.   Assess/Plan:

## 2011-10-08 ENCOUNTER — Other Ambulatory Visit: Payer: Self-pay | Admitting: *Deleted

## 2011-10-08 ENCOUNTER — Other Ambulatory Visit: Payer: Self-pay | Admitting: Internal Medicine

## 2011-10-08 MED ORDER — CARVEDILOL 25 MG PO TABS: 25.0000 mg | ORAL_TABLET | Freq: Two times a day (BID) | ORAL | Status: DC

## 2011-10-09 ENCOUNTER — Ambulatory Visit (INDEPENDENT_AMBULATORY_CARE_PROVIDER_SITE_OTHER): Payer: BC Managed Care – PPO | Admitting: *Deleted

## 2011-10-09 DIAGNOSIS — I4891 Unspecified atrial fibrillation: Secondary | ICD-10-CM

## 2011-10-29 ENCOUNTER — Other Ambulatory Visit: Payer: Self-pay | Admitting: Internal Medicine

## 2011-11-20 ENCOUNTER — Ambulatory Visit (INDEPENDENT_AMBULATORY_CARE_PROVIDER_SITE_OTHER): Payer: BC Managed Care – PPO

## 2011-11-20 DIAGNOSIS — I4891 Unspecified atrial fibrillation: Secondary | ICD-10-CM

## 2011-12-13 ENCOUNTER — Ambulatory Visit (INDEPENDENT_AMBULATORY_CARE_PROVIDER_SITE_OTHER): Payer: BC Managed Care – PPO | Admitting: *Deleted

## 2011-12-13 DIAGNOSIS — I4891 Unspecified atrial fibrillation: Secondary | ICD-10-CM

## 2011-12-14 ENCOUNTER — Encounter: Payer: Self-pay | Admitting: Internal Medicine

## 2011-12-14 LAB — REMOTE PACEMAKER DEVICE
BATTERY VOLTAGE: 2.96 V
BRDY-0004RV: 110 {beats}/min
VENTRICULAR PACING PM: 56

## 2011-12-21 ENCOUNTER — Encounter: Payer: Self-pay | Admitting: *Deleted

## 2012-01-01 ENCOUNTER — Ambulatory Visit (INDEPENDENT_AMBULATORY_CARE_PROVIDER_SITE_OTHER): Payer: BC Managed Care – PPO | Admitting: *Deleted

## 2012-01-01 DIAGNOSIS — I4891 Unspecified atrial fibrillation: Secondary | ICD-10-CM

## 2012-01-23 ENCOUNTER — Ambulatory Visit (INDEPENDENT_AMBULATORY_CARE_PROVIDER_SITE_OTHER): Payer: BC Managed Care – PPO

## 2012-01-23 DIAGNOSIS — Z23 Encounter for immunization: Secondary | ICD-10-CM

## 2012-01-29 ENCOUNTER — Telehealth: Payer: Self-pay | Admitting: Internal Medicine

## 2012-01-29 ENCOUNTER — Other Ambulatory Visit: Payer: Self-pay | Admitting: Orthopedic Surgery

## 2012-01-29 NOTE — Telephone Encounter (Signed)
Andrew Vance, I don't see where I had seen him recently. He is seeing Dr. Ladona Ridgel a couple of times. If hewould like me to see him in anticipation of the surgery I would be glad to

## 2012-01-29 NOTE — Telephone Encounter (Signed)
Will forward to Dr. Klein for review. 

## 2012-01-29 NOTE — Telephone Encounter (Signed)
New Problem:    Patient is scheduled to have another hip replacement surgery and wanted to get Dr. Odessa Fleming opinion on it.  Please call back.

## 2012-01-31 NOTE — Telephone Encounter (Signed)
Pt states he just wanted Dr. Graciela Husbands to know he was having surgery.

## 2012-02-12 ENCOUNTER — Ambulatory Visit (INDEPENDENT_AMBULATORY_CARE_PROVIDER_SITE_OTHER): Payer: BC Managed Care – PPO

## 2012-02-12 ENCOUNTER — Telehealth: Payer: Self-pay

## 2012-02-12 DIAGNOSIS — I4891 Unspecified atrial fibrillation: Secondary | ICD-10-CM

## 2012-02-12 LAB — POCT INR: INR: 2.4

## 2012-02-12 NOTE — Telephone Encounter (Signed)
Pt is scheduled for hip replacement surgery on 02/25/12, Dr Turner Daniels wants pt to hold Coumadin 4 days prior to surgery.  Please advise if ok to hold Coumadin prior to surgery.  Thanks

## 2012-02-13 ENCOUNTER — Other Ambulatory Visit: Payer: Self-pay | Admitting: Internal Medicine

## 2012-02-13 ENCOUNTER — Encounter (HOSPITAL_COMMUNITY): Payer: Self-pay | Admitting: Pharmacy Technician

## 2012-02-13 MED ORDER — CARVEDILOL 25 MG PO TABS: 25.0000 mg | ORAL_TABLET | Freq: Two times a day (BID) | ORAL | Status: DC

## 2012-02-13 NOTE — Telephone Encounter (Signed)
O.k per Dr. Graciela Husbands.

## 2012-02-14 MED ORDER — WARFARIN SODIUM 5 MG PO TABS: ORAL_TABLET | ORAL | Status: DC

## 2012-02-14 NOTE — Telephone Encounter (Signed)
Spoke with pt's wife advised OK per Dr Graciela Husbands for pt to hold Coumadin prior to surgery as instructed by Dr Turner Daniels.

## 2012-02-18 ENCOUNTER — Encounter (HOSPITAL_COMMUNITY): Payer: Self-pay

## 2012-02-18 ENCOUNTER — Encounter (HOSPITAL_COMMUNITY)
Admission: RE | Admit: 2012-02-18 | Discharge: 2012-02-18 | Disposition: A | Discharge status: Home / Self Care | Payer: BC Managed Care – PPO | Source: Ambulatory Visit | Attending: Orthopedic Surgery | Admitting: Orthopedic Surgery

## 2012-02-18 HISTORY — DX: Personal history of other diseases of the digestive system: Z87.19

## 2012-02-18 HISTORY — DX: Unspecified osteoarthritis, unspecified site: M19.90

## 2012-02-18 HISTORY — DX: Adverse effect of unspecified anesthetic, initial encounter: T41.45XA

## 2012-02-18 LAB — URINALYSIS, ROUTINE W REFLEX MICROSCOPIC
Ketones, ur: NEGATIVE mg/dL
Nitrite: NEGATIVE
Protein, ur: NEGATIVE mg/dL
Urobilinogen, UA: 0.2 mg/dL (ref 0.0–1.0)
pH: 7 (ref 5.0–8.0)

## 2012-02-18 LAB — BASIC METABOLIC PANEL
CO2: 26 mEq/L (ref 19–32)
Glucose, Bld: 95 mg/dL (ref 70–99)
Potassium: 4.1 mEq/L (ref 3.5–5.1)
Sodium: 140 mEq/L (ref 135–145)

## 2012-02-18 LAB — CBC WITH DIFFERENTIAL/PLATELET
Eosinophils Absolute: 0.2 10*3/uL (ref 0.0–0.7)
Hemoglobin: 15.9 g/dL (ref 13.0–17.0)
Lymphs Abs: 1.1 10*3/uL (ref 0.7–4.0)
MCH: 32.1 pg (ref 26.0–34.0)
MCV: 93.8 fL (ref 78.0–100.0)
Monocytes Relative: 12 % (ref 3–12)
Neutrophils Relative %: 70 % (ref 43–77)
RBC: 4.96 MIL/uL (ref 4.22–5.81)

## 2012-02-18 LAB — URINE MICROSCOPIC-ADD ON

## 2012-02-18 LAB — TYPE AND SCREEN: Antibody Screen: NEGATIVE

## 2012-02-18 LAB — PROTIME-INR: INR: 2.15 — ABNORMAL HIGH (ref 0.00–1.49)

## 2012-02-18 MED ORDER — CHLORHEXIDINE GLUCONATE 4 % EX LIQD: 60.0000 mL | Freq: Once | CUTANEOUS | Status: DC

## 2012-02-18 NOTE — Progress Notes (Addendum)
Dr taylor note 5/13 echo 4/11 in epic Pacemaker order sent to device clinic [place] Chart given to [facility] pa to review labs

## 2012-02-18 NOTE — Pre-Procedure Instructions (Addendum)
20 Andrew Vance  02/18/2012   Your procedure is scheduled on: 02/25/12  Report to [facility] at 800 AM.  Call this number if you have problems the morning of surgery: [phone]   Remember:   Do not eat food or drinnk:After Midnight.    Take these medicines the morning of surgery with A SIP OF WATER:inhaler, carvedelol STOP multi vit, coumadin per dr   Drucilla Schmidt not wear jewelry,   Do not wear lotions, powders, or perfumes.  Do not shave 48 hours prior to surgery. Men may shave face and neck.  Do not bring valuables to the hospital.  Contacts, dentures or bridgework may not be worn into surgery.  Leave suitcase in the car. After surgery it may be brought to your room.  For patients admitted to the hospital, checkout time is 11:00 AM the day of discharge.   Patients discharged the day of surgery will not be allowed to drive home.  Name and phone number of your driver:katherine wife [phone]  Special Instructions: Incentive Spirometry - Practice and bring it with you on the day of surgery. Shower using CHG 2 nights before surgery and the night before surgery.  If you shower the day of surgery use CHG.  Use special wash - you have one bottle of CHG for all showers.  You should use approximately 1/3 of the bottle for each shower.   Please read over the following fact sheets that you were given: Pain Booklet, Coughing and Deep Breathing, Blood Transfusion Information, Total Joint Packet, MRSA Information and Surgical Site Infection Prevention

## 2012-02-19 ENCOUNTER — Encounter (HOSPITAL_COMMUNITY): Payer: Self-pay | Admitting: Vascular Surgery

## 2012-02-19 NOTE — Consult Note (Signed)
Anesthesia chart review: Patient is a 76 year old male scheduled for revision of left hip arthroplasty on 02/25/2012 by Dr. Turner Daniels.  History includes former smoker, hypertension, GERD, atrial fibrillation, tachy brady syndrome s/p St. Jude PPM, mild non-obstructive CAD by cath '10, GI bleed, hyperlipidemia, hiatal hernia, iron deficiency anemia, psoriatic arthritis.  He had hypertension with spinal anesthesia several years ago.  He is followed at [facility].  He has seen both Dr. Graciela Husbands and Dr. Ladona Ridgel.  Most recent visit was with Dr. Ladona Ridgel on 09/05/11.  However, it was Dr. Graciela Husbands who was notified of planned procedure and gave permission for patient to hold his Coumadin pre-operatively (see telephone encounters, specifically from Cloyde Reams, RN on 02/12/12).   EKG on 02/18/12 showed atrial fibrillation, incomplete left bundle branch block, lateral ST-T wave abnormality, consider ischemia.  It was not felt significantly changed from his prior EKG in [place] (05/26/10)  Echo on 08/02/09 showed: - Left ventricle: The cavity size was mildly dilated. Wall thickness was increased in a pattern of mild LVH. Systolic function was mildly to moderately reduced. The estimated ejection fraction was in the range of 40% to 45%. Diffuse hypokinesis. Doppler parameters are consistent with abnormal left ventricular relaxation (grade 1 diastolic dysfunction). - Mitral valve: Mild regurgitation. - Left atrium: The atrium was moderately dilated. - Tricuspid valve: Mild regurgitation. - Pericardium, extracardiac: A trivial pericardial effusion was identified. (EF previously 40-45% 01/26/09, 35% 10/27/08, 20-25% and 15--20% in May 2010.)  He had a cardiac cath on 09/10/08 (copy found in [place]) that showed mild nonobstructive coronary artery disease (mid and proximal LAD with mild luminal irregularities, D1/D3 with mild plaque, OM3 with mild luminal irregularities, proximal RCA with 30-40% stenosis), global left  ventricular systolic dysfunction with EF 20%, normal right-sided filling pressures.  Chest x-ray on 02/18/2012 showed no evidence of acute cardiopulmonary disease.  PFTs on 08/29/10 showed an FEV1 of 88%.  Labs noted.  He will need a repeat PT/PTT on arrival.    If not significant change in his status then anticipate he can proceed as planned.  Shonna Chock, PA-C

## 2012-02-19 NOTE — H&P (Signed)
Andrew Vance is an 76 y.o. male.   Chief Complaint: recurrent left THA dislocations HPI: Patient is seen in consultation from Dr. Thurston Hole for repeat left total hip dislocations, the first in February 2013, and most recently 01/05/2012.  Both dislocations of occurred when the patient was flexed forward.  He is 15 years status post his left total hip which has an Omniflex stem, 26 mm head, and a PSL cup.  All components are well fixed, per report.   Past Medical History  Diagnosis Date  . Psoriatic arthritis   . GERD (gastroesophageal reflux disease)   . Hypertension   . Atrial fibrillation     sees Dr. Sherryl Manges  . Tachycardia-bradycardia syndrome   . GI bleed     hx  . Hyperlipidemia   . Complication of anesthesia     spinal yrs ago caused blood pressure to elevate  . Pacemaker   . H/O hiatal hernia   . Arthritis   . Anemia, iron deficiency     hx  . GI bleed     hx    Past Surgical History  Procedure Date  . Total hip arthroplasty 1998 and 2001    bilateral, sees Dr. Wyline Mood   . Appendectomy   . Hand surgery 1978    lft hand, fusions to DIPs and PIPs of fingers 1,2,4,and 5  . Colonoscopy 05-27-10    per Dr, Christella Hartigan, extensive diverticulosis, repeat in 5 yrs   . Esophagogastroduodenoscopy 05-27-10    per Dr. Christella Hartigan, normal   . Cardiac catheterization 09/10/08  . Pilonidal cyst excision 63    Family History  Problem Relation Age of Onset  . Hypertension Other   . Heart disease Other    Social History:  reports that he quit smoking about 30 years ago. His smoking use included Cigarettes and Pipe. He has a 30 pack-year smoking history. He has quit using smokeless tobacco. He reports that he drinks about one ounce of alcohol per week. He reports that he does not use illicit drugs.  Allergies: No Known Allergies  No prescriptions prior to admission    Results for orders placed during the hospital encounter of 02/18/12 (from the past 48 hour(s))  URINALYSIS, ROUTINE W  REFLEX MICROSCOPIC     Status: Abnormal   Collection Time   02/18/12 12:13 PM      Component Value Range Comment   Color, Urine YELLOW  YELLOW    APPearance CLOUDY (*) CLEAR    Specific Gravity, Urine 1.004 (*) 1.005 - 1.030    pH 7.0  5.0 - 8.0    Glucose, UA NEGATIVE  NEGATIVE mg/dL    Hgb urine dipstick NEGATIVE  NEGATIVE    Bilirubin Urine NEGATIVE  NEGATIVE    Ketones, ur NEGATIVE  NEGATIVE mg/dL    Protein, ur NEGATIVE  NEGATIVE mg/dL    Urobilinogen, UA 0.2  0.0 - 1.0 mg/dL    Nitrite NEGATIVE  NEGATIVE    Leukocytes, UA TRACE (*) NEGATIVE   URINE MICROSCOPIC-ADD ON     Status: Normal   Collection Time   02/18/12 12:13 PM      Component Value Range Comment   Squamous Epithelial / LPF RARE  RARE    WBC, UA 3-6  <3 WBC/hpf    RBC / HPF 0-2  <3 RBC/hpf    Bacteria, UA RARE  RARE   SURGICAL PCR SCREEN     Status: Abnormal   Collection Time   02/18/12 12:15 PM  Component Value Range Comment   MRSA, PCR NEGATIVE  NEGATIVE    Staphylococcus aureus POSITIVE (*) NEGATIVE   TYPE AND SCREEN     Status: Normal   Collection Time   02/18/12 12:30 PM      Component Value Range Comment   ABO/RH(D) O POS      Antibody Screen NEG      Sample Expiration 03/03/2012     APTT     Status: Abnormal   Collection Time   02/18/12 12:31 PM      Component Value Range Comment   aPTT 47 (*) 24 - 37 seconds   BASIC METABOLIC PANEL     Status: Normal   Collection Time   02/18/12 12:31 PM      Component Value Range Comment   Sodium 140  135 - 145 mEq/L    Potassium 4.1  3.5 - 5.1 mEq/L    Chloride 103  96 - 112 mEq/L    CO2 26  19 - 32 mEq/L    Glucose, Bld 95  70 - 99 mg/dL    BUN 9  6 - 23 mg/dL    Creatinine, Ser 1.61  0.50 - 1.35 mg/dL    Calcium 9.9  8.4 - 09.6 mg/dL    GFR calc non Af Amer >90  >90 mL/min    GFR calc Af Amer >90  >90 mL/min   CBC WITH DIFFERENTIAL     Status: Normal   Collection Time   02/18/12 12:31 PM      Component Value Range Comment   WBC 7.4  4.0 -  10.5 K/uL    RBC 4.96  4.22 - 5.81 MIL/uL    Hemoglobin 15.9  13.0 - 17.0 g/dL    HCT 04.5  40.9 - 81.1 %    MCV 93.8  78.0 - 100.0 fL    MCH 32.1  26.0 - 34.0 pg    MCHC 34.2  30.0 - 36.0 g/dL    RDW 91.4  78.2 - 95.6 %    Platelets 154  150 - 400 K/uL    Neutrophils Relative 70  43 - 77 %    Neutro Abs 5.2  1.7 - 7.7 K/uL    Lymphocytes Relative 15  12 - 46 %    Lymphs Abs 1.1  0.7 - 4.0 K/uL    Monocytes Relative 12  3 - 12 %    Monocytes Absolute 0.9  0.1 - 1.0 K/uL    Eosinophils Relative 2  0 - 5 %    Eosinophils Absolute 0.2  0.0 - 0.7 K/uL    Basophils Relative 0  0 - 1 %    Basophils Absolute 0.0  0.0 - 0.1 K/uL   PROTIME-INR     Status: Abnormal   Collection Time   02/18/12 12:31 PM      Component Value Range Comment   Prothrombin Time 23.1 (*) 11.6 - 15.2 seconds    INR 2.15 (*) 0.00 - 1.49    Dg Chest 2 View  02/18/2012  *RADIOLOGY REPORT*  Clinical Data: Preop left total hip revision  CHEST - 2 VIEW  Comparison: 05/26/2010  Findings: Lungs are essentially clear. No pleural effusion or pneumothorax.  Stable mild cardiomegaly.  Left subclavian dual lead pacemaker.  Mild degenerative changes of the visualized thoracolumbar spine.  IMPRESSION: No evidence of acute cardiopulmonary disease.   Original Report Authenticated By: Charline Bills, M.D.     Review of Systems  Constitutional: Negative.  HENT: Negative.   Eyes: Negative.   Respiratory: Negative.   Cardiovascular: Negative.   Gastrointestinal: Negative.   Genitourinary: Negative.   Musculoskeletal: Positive for joint pain.  Skin: Negative.   Endo/Heme/Allergies: Negative.   Psychiatric/Behavioral: Negative.     There were no vitals taken for this visit. Physical Exam  Constitutional: He is oriented to person, place, and time. He appears well-developed and well-nourished.  HENT:  Head: Normocephalic.  Eyes: Pupils are equal, round, and reactive to light.  Cardiovascular: Normal heart sounds.     Respiratory: Breath sounds normal.  Musculoskeletal:       Left hip: He exhibits decreased range of motion.  Neurological: He is alert and oriented to person, place, and time.  Psychiatric: He has a normal mood and affect.    In a seated position, internal and external rotation of both total hips is about 30, if he flexes forward to 105 with internal rotation he can feel the hip coming up against the polyethylene.  Foot tap is negative and he is neurovascularly intact.  X-rays are reviewed showing about 5 mm of polyethylene wear on the left 3 on the right.  Assessment/Plan Assess: A 76 year old a left total hip with a 26 mm head and 2 dislocations in the last few months.  Significant polyethylene wear to the left hip.  Plan: Options were discussed at length with the patient.  We will get him set up for revision of the left total hip and probably put him in a larger femoral head at least 32 mm hopefully 36, and a new polyethylene liner.  The risks and benefits of surgery discussed at length with the patient and models brought into the room to go over the process of how the hip was dislocating.  I will see him back at the time of surgical intervention.  Destina Mantei M. 02/19/2012, 9:40 AM

## 2012-02-22 NOTE — Progress Notes (Signed)
NOTFIED Augie Dangler OF TIME CHANGE, INSTRUCTED TO ARRIVE 02/25/12 AT 1000 AM.

## 2012-02-24 MED ORDER — CEFAZOLIN SODIUM-DEXTROSE 2-3 GM-% IV SOLR
2.0000 g | INTRAVENOUS | Status: AC
  Administered 2012-02-25: 2 g via INTRAVENOUS
  Filled 2012-02-24: qty 50

## 2012-02-25 ENCOUNTER — Inpatient Hospital Stay (HOSPITAL_COMMUNITY): Payer: BC Managed Care – PPO

## 2012-02-25 ENCOUNTER — Encounter (HOSPITAL_COMMUNITY)
Admission: RE | Disposition: A | Discharge status: Home / Self Care | Payer: Self-pay | Source: Ambulatory Visit | Attending: Orthopedic Surgery

## 2012-02-25 ENCOUNTER — Encounter (HOSPITAL_COMMUNITY): Payer: Self-pay | Admitting: Vascular Surgery

## 2012-02-25 ENCOUNTER — Ambulatory Visit (HOSPITAL_COMMUNITY): Payer: BC Managed Care – PPO | Admitting: Vascular Surgery

## 2012-02-25 ENCOUNTER — Encounter (HOSPITAL_COMMUNITY): Payer: Self-pay | Admitting: *Deleted

## 2012-02-25 ENCOUNTER — Inpatient Hospital Stay (HOSPITAL_COMMUNITY)
Admission: RE | Admit: 2012-02-25 | Discharge: 2012-02-27 | DRG: 817 | Disposition: A | Discharge status: Home / Self Care | Payer: BC Managed Care – PPO | Source: Ambulatory Visit | Attending: Orthopedic Surgery | Admitting: Orthopedic Surgery

## 2012-02-25 DIAGNOSIS — Y831 Surgical operation with implant of artificial internal device as the cause of abnormal reaction of the patient, or of later complication, without mention of misadventure at the time of the procedure: Secondary | ICD-10-CM | POA: Diagnosis present

## 2012-02-25 DIAGNOSIS — T84028A Dislocation of other internal joint prosthesis, initial encounter: Secondary | ICD-10-CM

## 2012-02-25 DIAGNOSIS — K449 Diaphragmatic hernia without obstruction or gangrene: Secondary | ICD-10-CM | POA: Diagnosis present

## 2012-02-25 DIAGNOSIS — Z01818 Encounter for other preprocedural examination: Secondary | ICD-10-CM

## 2012-02-25 DIAGNOSIS — K219 Gastro-esophageal reflux disease without esophagitis: Secondary | ICD-10-CM | POA: Diagnosis present

## 2012-02-25 DIAGNOSIS — F17200 Nicotine dependence, unspecified, uncomplicated: Secondary | ICD-10-CM | POA: Diagnosis present

## 2012-02-25 DIAGNOSIS — Z01812 Encounter for preprocedural laboratory examination: Secondary | ICD-10-CM

## 2012-02-25 DIAGNOSIS — L4050 Arthropathic psoriasis, unspecified: Secondary | ICD-10-CM | POA: Diagnosis present

## 2012-02-25 DIAGNOSIS — J449 Chronic obstructive pulmonary disease, unspecified: Secondary | ICD-10-CM | POA: Diagnosis present

## 2012-02-25 DIAGNOSIS — E785 Hyperlipidemia, unspecified: Secondary | ICD-10-CM | POA: Diagnosis present

## 2012-02-25 DIAGNOSIS — Z96649 Presence of unspecified artificial hip joint: Secondary | ICD-10-CM

## 2012-02-25 DIAGNOSIS — Z950 Presence of cardiac pacemaker: Secondary | ICD-10-CM

## 2012-02-25 DIAGNOSIS — I4891 Unspecified atrial fibrillation: Secondary | ICD-10-CM | POA: Diagnosis present

## 2012-02-25 DIAGNOSIS — I495 Sick sinus syndrome: Secondary | ICD-10-CM | POA: Diagnosis present

## 2012-02-25 DIAGNOSIS — Z01810 Encounter for preprocedural cardiovascular examination: Secondary | ICD-10-CM

## 2012-02-25 DIAGNOSIS — I10 Essential (primary) hypertension: Secondary | ICD-10-CM | POA: Diagnosis present

## 2012-02-25 DIAGNOSIS — T84029A Dislocation of unspecified internal joint prosthesis, initial encounter: Principal | ICD-10-CM | POA: Diagnosis present

## 2012-02-25 DIAGNOSIS — J4489 Other specified chronic obstructive pulmonary disease: Secondary | ICD-10-CM | POA: Diagnosis present

## 2012-02-25 HISTORY — PX: TOTAL HIP REVISION: SHX763

## 2012-02-25 LAB — APTT: aPTT: 37 seconds (ref 24–37)

## 2012-02-25 LAB — PROTIME-INR
INR: 1.22 (ref 0.00–1.49)
Prothrombin Time: 15.2 seconds (ref 11.6–15.2)

## 2012-02-25 SURGERY — TOTAL HIP REVISION
Anesthesia: General | Site: Hip | Laterality: Left | Wound class: Clean

## 2012-02-25 MED ORDER — HYDROMORPHONE HCL PF 1 MG/ML IJ SOLN
0.2500 mg | INTRAMUSCULAR | Status: DC | PRN
  Administered 2012-02-25 (×4): 0.5 mg via INTRAVENOUS

## 2012-02-25 MED ORDER — ONDANSETRON HCL 4 MG/2ML IJ SOLN: 4.0000 mg | Freq: Four times a day (QID) | INTRAMUSCULAR | Status: DC | PRN

## 2012-02-25 MED ORDER — ZOLPIDEM TARTRATE 5 MG PO TABS: 5.0000 mg | ORAL_TABLET | Freq: Every evening | ORAL | Status: DC | PRN

## 2012-02-25 MED ORDER — MEPERIDINE HCL 25 MG/ML IJ SOLN: 6.2500 mg | INTRAMUSCULAR | Status: DC | PRN

## 2012-02-25 MED ORDER — BUPIVACAINE-EPINEPHRINE (PF) 0.5% -1:200000 IJ SOLN
INTRAMUSCULAR | Status: AC
  Filled 2012-02-25: qty 10

## 2012-02-25 MED ORDER — DIPHENHYDRAMINE HCL 12.5 MG/5ML PO ELIX: 12.5000 mg | ORAL_SOLUTION | ORAL | Status: DC | PRN

## 2012-02-25 MED ORDER — LACTATED RINGERS IV SOLN
INTRAVENOUS | Status: DC
  Administered 2012-02-25: 11:00:00 via INTRAVENOUS

## 2012-02-25 MED ORDER — EPHEDRINE SULFATE 50 MG/ML IJ SOLN
INTRAMUSCULAR | Status: DC | PRN
  Administered 2012-02-25: 5 mg via INTRAVENOUS
  Administered 2012-02-25: 15 mg via INTRAVENOUS

## 2012-02-25 MED ORDER — KCL IN DEXTROSE-NACL 20-5-0.45 MEQ/L-%-% IV SOLN
INTRAVENOUS | Status: DC
  Administered 2012-02-25 – 2012-02-26 (×2): via INTRAVENOUS
  Filled 2012-02-25 (×7): qty 1000

## 2012-02-25 MED ORDER — PHENOL 1.4 % MT LIQD: 1.0000 | OROMUCOSAL | Status: DC | PRN

## 2012-02-25 MED ORDER — MIDAZOLAM HCL 5 MG/5ML IJ SOLN
INTRAMUSCULAR | Status: DC | PRN
  Administered 2012-02-25: 1 mg via INTRAVENOUS

## 2012-02-25 MED ORDER — ATORVASTATIN CALCIUM 20 MG PO TABS
20.0000 mg | ORAL_TABLET | Freq: Every day | ORAL | Status: DC
  Administered 2012-02-25 – 2012-02-26 (×2): 20 mg via ORAL
  Filled 2012-02-25 (×3): qty 1

## 2012-02-25 MED ORDER — PROPOFOL 10 MG/ML IV BOLUS
INTRAVENOUS | Status: DC | PRN
  Administered 2012-02-25: 200 mg via INTRAVENOUS

## 2012-02-25 MED ORDER — LACTATED RINGERS IV SOLN
INTRAVENOUS | Status: DC | PRN
  Administered 2012-02-25 (×2): via INTRAVENOUS

## 2012-02-25 MED ORDER — BUPIVACAINE-EPINEPHRINE 0.5% -1:200000 IJ SOLN
INTRAMUSCULAR | Status: DC | PRN
  Administered 2012-02-25: 10 mL

## 2012-02-25 MED ORDER — METOCLOPRAMIDE HCL 5 MG/ML IJ SOLN: 5.0000 mg | Freq: Three times a day (TID) | INTRAMUSCULAR | Status: DC | PRN

## 2012-02-25 MED ORDER — WARFARIN - PHARMACIST DOSING INPATIENT
Freq: Every day | Status: DC
  Administered 2012-02-26: 18:00:00

## 2012-02-25 MED ORDER — MENTHOL 3 MG MT LOZG: 1.0000 | LOZENGE | OROMUCOSAL | Status: DC | PRN

## 2012-02-25 MED ORDER — FENTANYL CITRATE 0.05 MG/ML IJ SOLN
INTRAMUSCULAR | Status: DC | PRN
  Administered 2012-02-25 (×2): 50 ug via INTRAVENOUS

## 2012-02-25 MED ORDER — ONDANSETRON HCL 4 MG PO TABS: 4.0000 mg | ORAL_TABLET | Freq: Four times a day (QID) | ORAL | Status: DC | PRN

## 2012-02-25 MED ORDER — OXYCODONE HCL 5 MG PO TABS
5.0000 mg | ORAL_TABLET | ORAL | Status: DC | PRN
  Administered 2012-02-26: 10 mg via ORAL
  Filled 2012-02-25: qty 2

## 2012-02-25 MED ORDER — OXYCODONE HCL 5 MG/5ML PO SOLN: 5.0000 mg | Freq: Once | ORAL | Status: DC | PRN

## 2012-02-25 MED ORDER — WARFARIN SODIUM 10 MG PO TABS
10.0000 mg | ORAL_TABLET | Freq: Once | ORAL | Status: AC
  Administered 2012-02-25: 10 mg via ORAL
  Filled 2012-02-25: qty 1

## 2012-02-25 MED ORDER — METHOCARBAMOL 500 MG PO TABS
500.0000 mg | ORAL_TABLET | Freq: Four times a day (QID) | ORAL | Status: DC | PRN
  Administered 2012-02-25 – 2012-02-26 (×4): 500 mg via ORAL
  Filled 2012-02-25 (×5): qty 1

## 2012-02-25 MED ORDER — LIDOCAINE HCL (CARDIAC) 20 MG/ML IV SOLN
INTRAVENOUS | Status: DC | PRN
  Administered 2012-02-25: 50 mg via INTRAVENOUS

## 2012-02-25 MED ORDER — MIDAZOLAM HCL 2 MG/2ML IJ SOLN: 0.5000 mg | Freq: Once | INTRAMUSCULAR | Status: DC | PRN

## 2012-02-25 MED ORDER — ARTIFICIAL TEARS OP OINT
TOPICAL_OINTMENT | OPHTHALMIC | Status: DC | PRN
  Administered 2012-02-25: 1 via OPHTHALMIC

## 2012-02-25 MED ORDER — OXYCODONE HCL 5 MG PO TABS: 5.0000 mg | ORAL_TABLET | Freq: Once | ORAL | Status: DC | PRN

## 2012-02-25 MED ORDER — VECURONIUM BROMIDE 10 MG IV SOLR
INTRAVENOUS | Status: DC | PRN
  Administered 2012-02-25: 6 mg via INTRAVENOUS

## 2012-02-25 MED ORDER — DEXTROSE-NACL 5-0.45 % IV SOLN: INTRAVENOUS | Status: DC

## 2012-02-25 MED ORDER — FLEET ENEMA 7-19 GM/118ML RE ENEM: 1.0000 | ENEMA | Freq: Once | RECTAL | Status: AC | PRN

## 2012-02-25 MED ORDER — MAGNESIUM HYDROXIDE 400 MG/5ML PO SUSP: 30.0000 mL | Freq: Every day | ORAL | Status: DC | PRN

## 2012-02-25 MED ORDER — WARFARIN SODIUM 5 MG PO TABS: 5.0000 mg | ORAL_TABLET | Freq: Once | ORAL | Status: DC

## 2012-02-25 MED ORDER — LISINOPRIL 10 MG PO TABS
10.0000 mg | ORAL_TABLET | Freq: Every day | ORAL | Status: DC
Start: 2012-02-25 — End: 2012-02-27
  Administered 2012-02-25 – 2012-02-27 (×3): 10 mg via ORAL
  Filled 2012-02-25 (×3): qty 1

## 2012-02-25 MED ORDER — PHENYLEPHRINE HCL 10 MG/ML IJ SOLN
INTRAMUSCULAR | Status: DC | PRN
  Administered 2012-02-25 (×2): 80 ug via INTRAVENOUS
  Administered 2012-02-25: 120 ug via INTRAVENOUS

## 2012-02-25 MED ORDER — CELECOXIB 200 MG PO CAPS
200.0000 mg | ORAL_CAPSULE | Freq: Two times a day (BID) | ORAL | Status: DC
  Administered 2012-02-25 – 2012-02-27 (×4): 200 mg via ORAL
  Filled 2012-02-25 (×5): qty 1

## 2012-02-25 MED ORDER — HYDROMORPHONE HCL PF 1 MG/ML IJ SOLN
INTRAMUSCULAR | Status: AC
  Filled 2012-02-25: qty 1

## 2012-02-25 MED ORDER — CARVEDILOL 25 MG PO TABS
25.0000 mg | ORAL_TABLET | Freq: Two times a day (BID) | ORAL | Status: DC
Start: 2012-02-26 — End: 2012-02-27
  Administered 2012-02-26 – 2012-02-27 (×3): 25 mg via ORAL
  Filled 2012-02-25 (×6): qty 1

## 2012-02-25 MED ORDER — KCL IN DEXTROSE-NACL 20-5-0.45 MEQ/L-%-% IV SOLN
INTRAVENOUS | Status: AC
  Filled 2012-02-25: qty 1000

## 2012-02-25 MED ORDER — GLYCOPYRROLATE 0.2 MG/ML IJ SOLN
INTRAMUSCULAR | Status: DC | PRN
  Administered 2012-02-25: 0.6 mg via INTRAVENOUS

## 2012-02-25 MED ORDER — ONDANSETRON HCL 4 MG/2ML IJ SOLN
INTRAMUSCULAR | Status: DC | PRN
  Administered 2012-02-25: 4 mg via INTRAVENOUS

## 2012-02-25 MED ORDER — SODIUM CHLORIDE 0.9 % IR SOLN
Status: DC | PRN
  Administered 2012-02-25: 1000 mL

## 2012-02-25 MED ORDER — WARFARIN SODIUM 5 MG PO TABS: 5.0000 mg | ORAL_TABLET | Freq: Every day | ORAL | Status: DC

## 2012-02-25 MED ORDER — ACETAMINOPHEN 10 MG/ML IV SOLN
1000.0000 mg | Freq: Four times a day (QID) | INTRAVENOUS | Status: AC
  Administered 2012-02-25 – 2012-02-26 (×4): 1000 mg via INTRAVENOUS
  Filled 2012-02-25 (×4): qty 100

## 2012-02-25 MED ORDER — ACETAMINOPHEN 325 MG PO TABS: 650.0000 mg | ORAL_TABLET | Freq: Four times a day (QID) | ORAL | Status: DC | PRN

## 2012-02-25 MED ORDER — HYDROMORPHONE HCL PF 1 MG/ML IJ SOLN
1.0000 mg | INTRAMUSCULAR | Status: DC | PRN
  Administered 2012-02-26 (×3): 1 mg via INTRAVENOUS
  Filled 2012-02-25 (×3): qty 1

## 2012-02-25 MED ORDER — CALCIPOTRIENE 0.005 % EX CREA: 1.0000 "application " | TOPICAL_CREAM | Freq: Two times a day (BID) | CUTANEOUS | Status: DC | PRN

## 2012-02-25 MED ORDER — ACETAMINOPHEN 650 MG RE SUPP: 650.0000 mg | Freq: Four times a day (QID) | RECTAL | Status: DC | PRN

## 2012-02-25 MED ORDER — METHOCARBAMOL 100 MG/ML IJ SOLN
500.0000 mg | Freq: Four times a day (QID) | INTRAVENOUS | Status: DC | PRN
  Administered 2012-02-25: 500 mg via INTRAVENOUS
  Filled 2012-02-25: qty 5

## 2012-02-25 MED ORDER — BISACODYL 5 MG PO TBEC: 5.0000 mg | DELAYED_RELEASE_TABLET | Freq: Every day | ORAL | Status: DC | PRN

## 2012-02-25 MED ORDER — ALUM & MAG HYDROXIDE-SIMETH 200-200-20 MG/5ML PO SUSP
30.0000 mL | ORAL | Status: DC | PRN
  Administered 2012-02-26 – 2012-02-27 (×2): 30 mL via ORAL
  Filled 2012-02-25 (×2): qty 30

## 2012-02-25 MED ORDER — PROMETHAZINE HCL 25 MG/ML IJ SOLN: 6.2500 mg | INTRAMUSCULAR | Status: DC | PRN

## 2012-02-25 MED ORDER — METOCLOPRAMIDE HCL 10 MG PO TABS: 5.0000 mg | ORAL_TABLET | Freq: Three times a day (TID) | ORAL | Status: DC | PRN

## 2012-02-25 MED ORDER — NEOSTIGMINE METHYLSULFATE 1 MG/ML IJ SOLN
INTRAMUSCULAR | Status: DC | PRN
  Administered 2012-02-25: 4 mg via INTRAVENOUS

## 2012-02-25 MED ORDER — POTASSIUM CHLORIDE CRYS ER 20 MEQ PO TBCR
20.0000 meq | EXTENDED_RELEASE_TABLET | Freq: Every day | ORAL | Status: DC
  Administered 2012-02-25 – 2012-02-27 (×3): 20 meq via ORAL
  Filled 2012-02-25 (×2): qty 1

## 2012-02-25 MED ORDER — BUDESONIDE-FORMOTEROL FUMARATE 160-4.5 MCG/ACT IN AERO
2.0000 | INHALATION_SPRAY | RESPIRATORY_TRACT | Status: DC | PRN
Start: 2012-02-25 — End: 2012-02-27
  Filled 2012-02-25: qty 6

## 2012-02-25 SURGICAL SUPPLY — 66 items
4.5X60MM SCREW ×1 IMPLANT
BOWL SMART MIX CTS (DISPOSABLE) IMPLANT
BRUSH FEMORAL CANAL (MISCELLANEOUS) IMPLANT
CLOTH BEACON ORANGE TIMEOUT ST (SAFETY) ×2 IMPLANT
COVER SURGICAL LIGHT HANDLE (MISCELLANEOUS) ×2 IMPLANT
DRAPE C-ARM 42X72 X-RAY (DRAPES) IMPLANT
DRAPE ORTHO SPLIT 77X108 STRL (DRAPES) ×2
DRAPE PROXIMA HALF (DRAPES) ×2 IMPLANT
DRAPE SURG ORHT 6 SPLT 77X108 (DRAPES) ×1 IMPLANT
DRAPE U-SHAPE 47X51 STRL (DRAPES) ×2 IMPLANT
DRILL BIT 7/64X5 (BIT) ×2 IMPLANT
DRSG MEPILEX BORDER 4X12 (GAUZE/BANDAGES/DRESSINGS) ×1 IMPLANT
DRSG MEPILEX BORDER 4X4 (GAUZE/BANDAGES/DRESSINGS) ×1 IMPLANT
DRSG MEPILEX BORDER 4X8 (GAUZE/BANDAGES/DRESSINGS) ×1 IMPLANT
DURAPREP 26ML APPLICATOR (WOUND CARE) ×2 IMPLANT
ELECT BLADE 4.0 EZ CLEAN MEGAD (MISCELLANEOUS) ×2
ELECT BLADE 6.5 EXT (BLADE) IMPLANT
ELECT REM PT RETURN 9FT ADLT (ELECTROSURGICAL) ×2
ELECTRODE BLDE 4.0 EZ CLN MEGD (MISCELLANEOUS) IMPLANT
ELECTRODE REM PT RTRN 9FT ADLT (ELECTROSURGICAL) ×1 IMPLANT
EVACUATOR 1/8 PVC DRAIN (DRAIN) IMPLANT
GAUZE XEROFORM 5X9 LF (GAUZE/BANDAGES/DRESSINGS) ×2 IMPLANT
GLOVE BIO SURGEON STRL SZ7 (GLOVE) ×2 IMPLANT
GLOVE BIO SURGEON STRL SZ7.5 (GLOVE) ×2 IMPLANT
GLOVE BIOGEL PI IND STRL 7.0 (GLOVE) ×1 IMPLANT
GLOVE BIOGEL PI IND STRL 8 (GLOVE) ×1 IMPLANT
GLOVE BIOGEL PI INDICATOR 7.0 (GLOVE) ×1
GLOVE BIOGEL PI INDICATOR 8 (GLOVE) ×1
GOWN PREVENTION PLUS XLARGE (GOWN DISPOSABLE) ×6 IMPLANT
GOWN STRL NON-REIN LRG LVL3 (GOWN DISPOSABLE) ×4 IMPLANT
HANDPIECE INTERPULSE COAX TIP (DISPOSABLE)
HEAD OSTEO CTAPER L FIT (Orthopedic Implant) ×2 IMPLANT
HEAD OSTEO CTAPER L FIT 32 (Orthopedic Implant) IMPLANT
HEEL PROTECTOR  874200 (MISCELLANEOUS)
HEEL PROTECTOR 874200 (MISCELLANEOUS) IMPLANT
HOOD PEEL AWAY FACE SHEILD DIS (HOOD) ×5 IMPLANT
KIT BASIN OR (CUSTOM PROCEDURE TRAY) ×2 IMPLANT
KIT ROOM TURNOVER OR (KITS) ×2 IMPLANT
MANIFOLD NEPTUNE II (INSTRUMENTS) ×2 IMPLANT
NEEDLE 22X1 1/2 (OR ONLY) (NEEDLE) ×2 IMPLANT
NOZZLE PRISM 8.5MM (MISCELLANEOUS) IMPLANT
NS IRRIG 1000ML POUR BTL (IV SOLUTION) ×4 IMPLANT
PACK TOTAL JOINT (CUSTOM PROCEDURE TRAY) ×2 IMPLANT
PAD ARMBOARD 7.5X6 YLW CONV (MISCELLANEOUS) ×4 IMPLANT
PASSER SUT SWANSON 36MM LOOP (INSTRUMENTS) IMPLANT
PRESSURIZER FEMORAL UNIV (MISCELLANEOUS) IMPLANT
SET HNDPC FAN SPRY TIP SCT (DISPOSABLE) IMPLANT
SLEEVE SURGEON STRL (DRAPES) IMPLANT
SPONGE GAUZE 4X4 12PLY (GAUZE/BANDAGES/DRESSINGS) ×2 IMPLANT
SPONGE LAP 18X18 X RAY DECT (DISPOSABLE) IMPLANT
STAPLER VISISTAT 35W (STAPLE) ×2 IMPLANT
SUT ETHIBOND 2 V 37 (SUTURE) ×2 IMPLANT
SUT ETHILON 3 0 FSL (SUTURE) ×2 IMPLANT
SUT VIC AB 0 CTB1 27 (SUTURE) ×2 IMPLANT
SUT VIC AB 1 CTX 36 (SUTURE) ×2
SUT VIC AB 1 CTX36XBRD ANBCTR (SUTURE) ×1 IMPLANT
SUT VIC AB 2-0 CTB1 (SUTURE) ×2 IMPLANT
SYR 20ML ECCENTRIC (SYRINGE) ×2 IMPLANT
SYR CONTROL 10ML LL (SYRINGE) ×2 IMPLANT
TOWEL OR 17X24 6PK STRL BLUE (TOWEL DISPOSABLE) ×2 IMPLANT
TOWEL OR 17X26 10 PK STRL BLUE (TOWEL DISPOSABLE) ×2 IMPLANT
TOWER CARTRIDGE SMART MIX (DISPOSABLE) IMPLANT
TRAY FOLEY CATH 14FR (SET/KITS/TRAYS/PACK) ×1 IMPLANT
TUBE ANAEROBIC SPECIMEN COL (MISCELLANEOUS) ×2 IMPLANT
WATER STERILE IRR 1000ML POUR (IV SOLUTION) ×6 IMPLANT
omnifit crossfire 20degree eccentric insert, 32mm ×1 IMPLANT

## 2012-02-25 NOTE — Progress Notes (Signed)
Transported via bed to room with R N

## 2012-02-25 NOTE — Interval H&P Note (Signed)
History and Physical Interval Note:  02/25/2012 12:50 PM  Andrew Vance  has presented today for surgery, with the diagnosis of UNSTABLE LEFT TOTAL HIP ARTHROPLASTY  The various methods of treatment have been discussed with the patient and family. After consideration of risks, benefits and other options for treatment, the patient has consented to  Procedure(s) (LRB) with comments: TOTAL HIP REVISION (Left) as a surgical intervention .  The patient's history has been reviewed, patient examined, no change in status, stable for surgery.  I have reviewed the patient's chart and labs.  Questions were answered to the patient's satisfaction.     Nestor Lewandowsky

## 2012-02-25 NOTE — Op Note (Signed)
Pre Op Dx: Recurrent dislocations left total hip placed 1998, dislocations began over the last few months. Large 62 mm Osteonics PSL cup, 26 mm head as risk factors  Post Op Dx: Same  Procedure: Revision left total hip arthroplasty were the removal of 10 polyethylene liner that accepted a 26 mm femoral head to a 20 polyethylene liner +6 offset to except a +0 32 mm femoral head  Surgeon: Nestor Lewandowsky, MD  Assistant: Shirl Harris PA-C  Anesthesia: General  EBL: 400 cc  Fluids: 1800 cc  Indications: 76 year old man underwent left total up arthroplasty in 1998 using a series 1 PSL cup multi-hole with a 10 polyethylene liner, a cluster a 26 mm femoral head and Osteonics Omniflex stem. Did well until this urine he began having posterior dislocations of the hip. Plan x-ray showed good cup abduction, anteversion was around 10 on the metal rim. The stem appeared well placed and well fixed with no evidence of loosening. It was felt that the relatively small femoral head in relation to the femoral neck, and the 10 liner with 5 mm polyethylene wear may of been contributing to the dislocations. Plan a would be to remove the liner, revise to 20 liner index posterior and superior with a +0 32 mm head to gain about 30 more degrees of internal rotation before the hip starts to lever off the anterior rim. Risks and benefits of surgery discussed at length with the patient and he consents to surgery. Other options including revision of the acetabular shell, and constrained liner were discussed with the patient at length the  Procedure: Patient was identified by arm band receive preoperative IV antibiotics in the holding area at [facility]. He was then taken to operating room 6 for the appropriate anesthetic monitors were attached, general endotracheal anesthesia induced, Foley catheter inserted. He was then rolled into the right lateral decubitus position and held there with a Mark 2 pelvic clamp.  The left lower Chumley was then prepped and draped in usual sterile fashion from the ankle to the hemipelvis. Time out procedure was performed, the skin along the lateral hip and thigh infiltrated with 20 cc of half percent Marcaine and epinephrine solution. Using the old lateral, posterior lateral approach incision over a 20 cm length centered over the greater trochanter, we cut through skin and subcutaneous tissue down to the level of the IT band which was cut in line with skin incision, exposing the greater trochanter. We then took down the posterior pseudocapsule removing some caseous material from the joint itself it was sent off for Gram stain and culture but did not appear infectious. We continued to peel the pseudocapsule superiorly and inferiorly exposing the neck of the femoral stem the femoral head and the polyethylene with the obvious posterior dislocation tract. We are then able to dislocate the hip with flexion and internal rotation and removed the 26 mm head with a standard drift. The neck was then tucked superiorly and anteriorly to the acetabulum and the polyethylene liner removed with a 4.5 screw through a 3.2 mm drill hole and the polyethylene levering out of the acetabulum. The shell had about 10 of anteversion. At this point a new trial liner with a 20 lip to except a 32 mm head was placed with the index posterior superior. A new +32 mm trial head was placed on the stem. The hip was flexed to 90 with 70 of internal rotation and was noted to be stable. At this point the  trial components removed and a couple of titanium screws there were in the old PSL shell were also removed at this point. A new polyethylene liner with a 20 lip posterior superior was then hammered into place followed by a +0 32 mm L. fit head from [employer]. We checked our stability one more time and found to be excellent. The wound is irrigated out normal saline solution and the pseudocapsule closed with the #2 Ethibond stay  sutures that had been placed previously. The IT band was then closed with running #1 Vicryl suture. The subcutaneous tissue with 0 and 2-0 undyed Vicryl suture the skin was closed with running interlocking 3-0 nylon suture and a dressing of Mepilex was applied. Yhe patient was unclamped, awaken extubated and taken to the recovery without difficulty appear.

## 2012-02-25 NOTE — Anesthesia Procedure Notes (Signed)
Procedure Name: Intubation Date/Time: 02/25/2012 1:21 PM Performed by: Carmela Rima Pre-anesthesia Checklist: Patient identified, Timeout performed, Emergency Drugs available, Suction available and Patient being monitored Patient Re-evaluated:Patient Re-evaluated prior to inductionOxygen Delivery Method: Circle system utilized Preoxygenation: Pre-oxygenation with 100% oxygen Intubation Type: IV induction Ventilation: Mask ventilation without difficulty Laryngoscope Size: Mac and 3 Grade View: Grade II Tube type: Oral Tube size: 7.5 mm Number of attempts: 1 Placement Confirmation: ETT inserted through vocal cords under direct vision,  positive ETCO2 and breath sounds checked- equal and bilateral Secured at: 23 cm Tube secured with: Tape Dental Injury: Teeth and Oropharynx as per pre-operative assessment  Comments: Anterior anatomy

## 2012-02-25 NOTE — Anesthesia Postprocedure Evaluation (Signed)
  Anesthesia Post-op Note  Patient: Andrew Vance  Procedure(s) Performed: Procedure(s) (LRB) with comments: TOTAL HIP REVISION (Left)  Patient Location: PACU  Anesthesia Type:General  Level of Consciousness: awake  Airway and Oxygen Therapy: Patient Spontanous Breathing  Post-op Pain: mild  Post-op Assessment: Post-op Vital signs reviewed  Post-op Vital Signs: Reviewed  Complications: No apparent anesthesia complications

## 2012-02-25 NOTE — Preoperative (Signed)
Beta Blockers   Reason not to administer Beta Blockers:Not Applicable 

## 2012-02-25 NOTE — Progress Notes (Signed)
ANTICOAGULATION CONSULT NOTE - Initial Consult  Pharmacy Consult for coumadin Indication: VTE prophylaxis, a.fib  No Known Allergies  Patient Measurements: Height: 5' 8.11" (173 cm) Weight: 200 lb 9.9 oz (91 kg) IBW/kg (Calculated) : 68.65  Heparin Dosing Weight:   Vital Signs: Temp: 97.7 F (36.5 C) (10/28 1704) Temp src: Oral (10/28 1700) BP: 149/91 mmHg (10/28 1704) Pulse Rate: 81  (10/28 1704)  Labs:  Basename 02/25/12 1015  HGB --  HCT --  PLT --  APTT 37  LABPROT 15.2  INR 1.22  HEPARINUNFRC --  CREATININE --  CKTOTAL --  CKMB --  TROPONINI --    Estimated Creatinine Clearance: 88.9 ml/min (by C-G formula based on Cr of 0.71).   Medical History: Past Medical History  Diagnosis Date  . Psoriatic arthritis   . GERD (gastroesophageal reflux disease)   . Hypertension   . Atrial fibrillation     sees Dr. Sherryl Manges  . Tachycardia-bradycardia syndrome   . GI bleed     hx  . Hyperlipidemia   . Complication of anesthesia     spinal yrs ago caused blood pressure to elevate  . Pacemaker   . H/O hiatal hernia   . Arthritis   . Anemia, iron deficiency     hx  . GI bleed     hx    Medications:  Scheduled:    . acetaminophen  1,000 mg Intravenous Q6H  . atorvastatin  20 mg Oral q1800  . carvedilol  25 mg Oral BID WC  .  ceFAZolin (ANCEF) IV  2 g Intravenous 60 min Pre-Op  . celecoxib  200 mg Oral Q12H  . dextrose 5 % and 0.45 % NaCl with KCl 20 mEq/L      . HYDROmorphone      . HYDROmorphone      . lisinopril  10 mg Oral Daily  . potassium chloride SA  20 mEq Oral Daily  . warfarin  10 mg Oral Once  . Warfarin - Pharmacist Dosing Inpatient   Does not apply q1800  . DISCONTD: warfarin  5 mg Oral ONCE-1800  . DISCONTD: warfarin  5-7.5 mg Oral Daily    Assessment: 76 yr old male was admitted to the hospital for revision of the left total hip which had dislocated twice. Pt was on coumadin PTA for a.fib and home dose was 7.5 mg daily except 5 mg  on Sat. INR=1.22. Pt now to resume home coumadin for VTE proxphylaxis and a.fib. Goal of Therapy:  INR 2-3 Monitor platelets by anticoagulation protocol: Yes   Plan:  1) Coumadin 10 mg tonight 2) Daily PT/INR  Eugene Garnet 02/25/2012,9:04 PM

## 2012-02-25 NOTE — Anesthesia Preprocedure Evaluation (Addendum)
Anesthesia Evaluation  Patient identified by MRN, date of birth, ID band Patient awake    Reviewed: Allergy & Precautions, H&P , NPO status , Patient's Chart, lab work & pertinent test results, reviewed documented beta blocker date and time   History of Anesthesia Complications Negative for: history of anesthetic complications  Airway Mallampati: II TM Distance: >3 FB   Mouth opening: Limited Mouth Opening  Dental No notable dental hx. (+) Dental Advidsory Given and Teeth Intact   Pulmonary COPD (controlled with steroid inhaler) COPD inhaler,  breath sounds clear to auscultation  Pulmonary exam normal       Cardiovascular hypertension, On Medications and On Home Beta Blockers + dysrhythmias (off coumadin for 4 days, INR 1.22) Atrial Fibrillation + pacemaker Rhythm:Irregular Rate:Normal  ECHO '11: EF 40-45%   Neuro/Psych negative neurological ROS     GI/Hepatic Neg liver ROS, hiatal hernia, GERD-  Controlled,  Endo/Other  negative endocrine ROS  Renal/GU negative Renal ROS     Musculoskeletal   Abdominal   Peds  Hematology   Anesthesia Other Findings   Reproductive/Obstetrics                          Anesthesia Physical Anesthesia Plan  ASA: III  Anesthesia Plan: General   Post-op Pain Management:    Induction: Intravenous  Airway Management Planned: Oral ETT  Additional Equipment:   Intra-op Plan:   Post-operative Plan: Extubation in OR  Informed Consent: I have reviewed the patients History and Physical, chart, labs and discussed the procedure including the risks, benefits and alternatives for the proposed anesthesia with the patient or authorized representative who has indicated his/her understanding and acceptance.   Dental Advisory Given  Plan Discussed with: CRNA, Surgeon and Anesthesiologist  Anesthesia Plan Comments: (Plan routine monitors, GETA)       Anesthesia  Quick Evaluation

## 2012-02-25 NOTE — Progress Notes (Signed)
Second request for sign out dr. Randa Evens said may leave unit to go to 5 [facility]

## 2012-02-25 NOTE — Plan of Care (Signed)
Problem: Consults Goal: Diagnosis- Total Joint Replacement Revision Total Hip Left        

## 2012-02-25 NOTE — Transfer of Care (Signed)
Immediate Anesthesia Transfer of Care Note  Patient: Andrew Vance  Procedure(s) Performed: Procedure(s) (LRB) with comments: TOTAL HIP REVISION (Left)  Patient Location: PACU  Anesthesia Type:No value filed.  Level of Consciousness: awake, alert  and oriented  Airway & Oxygen Therapy: Patient Spontanous Breathing and Patient connected to nasal cannula oxygen  Post-op Assessment: Report given to PACU RN, Post -op Vital signs reviewed and stable and Patient moving all extremities X 4  Post vital signs: Reviewed and stable  Complications: No apparent anesthesia complications

## 2012-02-26 ENCOUNTER — Encounter (HOSPITAL_COMMUNITY): Payer: Self-pay | Admitting: General Practice

## 2012-02-26 LAB — BASIC METABOLIC PANEL
Calcium: 8.8 mg/dL (ref 8.4–10.5)
Creatinine, Ser: 0.83 mg/dL (ref 0.50–1.35)
GFR calc Af Amer: >90 mL/min (ref >90)
GFR calc non Af Amer: 85 mL/min — ABNORMAL LOW (ref >90)
Sodium: 137 mEq/L (ref 135–145)

## 2012-02-26 LAB — CBC
MCH: 31.9 pg (ref 26.0–34.0)
MCHC: 33.9 g/dL (ref 30.0–36.0)
MCV: 94 fL (ref 78.0–100.0)
Platelets: 156 10*3/uL (ref 150–400)
RBC: 4.17 MIL/uL — ABNORMAL LOW (ref 4.22–5.81)
RDW: 13.7 % (ref 11.5–15.5)

## 2012-02-26 LAB — PROTIME-INR
INR: 1.46 (ref 0.00–1.49)
Prothrombin Time: 17.3 seconds — ABNORMAL HIGH (ref 11.6–15.2)

## 2012-02-26 MED ORDER — WARFARIN SODIUM 7.5 MG PO TABS
7.5000 mg | ORAL_TABLET | Freq: Once | ORAL | Status: AC
  Administered 2012-02-26: 7.5 mg via ORAL
  Filled 2012-02-26 (×2): qty 1

## 2012-02-26 NOTE — Progress Notes (Signed)
Orthopedic Tech Progress Note Patient Details:  Andrew Vance Apr 23, 1936 [id number]  Patient ID: Andrew Vance, male   DOB: 1936-04-02, 76 y.o.   MRN: [id number]   [name] 02/26/2012, 11:02 AM [name]

## 2012-02-26 NOTE — Evaluation (Signed)
Physical Therapy Evaluation Patient Details Name: Andrew Vance MRN: [id number] DOB: 01-Oct-1935 Today's Date: 02/26/2012 Time: 4098-1191 PT Time Calculation (min): 29 min  PT Assessment / Plan / Recommendation Clinical Impression  Pt is 76 y/o male admitted for s/p left total hip revision with recurrent hip dislocation.  Pt will benefit from acute PT services to improve overall mobility and prepare for safe d/c home.    PT Assessment  Patient needs continued PT services    Follow Up Recommendations  Outpatient PT (Pt refuses HHPT and wants to returnt to work on Monday)    Does the patient have the potential to tolerate intense rehabilitation    None  Barriers to Discharge None      Equipment Recommendations  None recommended by PT    Recommendations for Other Services     Frequency 7X/week    Precautions / Restrictions Precautions Precautions: Posterior Hip;Fall Precaution Booklet Issued: Yes (comment) Precaution Comments: Pt unable to recall posterior hip precautions therefore handout given and educated on post hip precautions. Restrictions Weight Bearing Restrictions: Yes LLE Weight Bearing: Weight bearing as tolerated   Pertinent Vitals/Pain 3/10 left hip pain      Mobility  Bed Mobility Bed Mobility: Supine to Sit Supine to Sit: 4: Min guard Details for Bed Mobility Assistance: Minguard for safety with cues for proper technique to prevent internal rotation LLE. Transfers Transfers: Sit to Stand;Stand to Sit Sit to Stand: 4: Min guard;From bed Stand to Sit: 4: Min guard;To chair/3-in-1 Details for Transfer Assistance: Minguard for safety with cues for hand placement  Ambulation/Gait Ambulation/Gait Assistance: 4: Min guard Ambulation Distance (Feet): 125 Feet Assistive device: Rolling walker Ambulation/Gait Assistance Details: Minguard for safety with cues for proper step sequence. Gait Pattern: Step-to pattern;Antalgic    Shoulder Instructions       Exercises Total Joint Exercises Ankle Circles/Pumps: AAROM;Both;10 reps Quad Sets: AAROM;Strengthening;Left;5 reps Short Arc Quad: AAROM;Strengthening;Left;5 reps Heel Slides: AAROM;Strengthening;Left;5 reps Hip ABduction/ADduction: AAROM;Strengthening;Left;5 reps   PT Diagnosis: Difficulty walking;Abnormality of gait;Generalized weakness;Acute pain  PT Problem List: Decreased strength;Decreased range of motion;Decreased activity tolerance;Decreased balance;Pain;Decreased knowledge of use of DME PT Treatment Interventions: DME instruction;Gait training;Stair training;Functional mobility training;Therapeutic exercise;Therapeutic activities;Patient/family education   PT Goals Acute Rehab PT Goals PT Goal Formulation: With patient Time For Goal Achievement: 03/04/12 Potential to Achieve Goals: Good Pt will go Supine/Side to Sit: with modified independence PT Goal: Supine/Side to Sit - Progress: Goal set today Pt will go Sit to Supine/Side: with modified independence PT Goal: Sit to Supine/Side - Progress: Goal set today Pt will go Sit to Stand: with modified independence PT Goal: Sit to Stand - Progress: Goal set today Pt will go Stand to Sit: with modified independence PT Goal: Stand to Sit - Progress: Goal set today Pt will Ambulate: >150 feet;with modified independence;with rolling walker PT Goal: Ambulate - Progress: Goal set today Pt will Go Up / Down Stairs: 1-2 stairs;with supervision;with least restrictive assistive device  Visit Information  Last PT Received On: 02/26/12    Subjective Data  Subjective: "I want to returnt to work on Monday." Patient Stated Goal: To go back to work   Prior Functioning  Home Living Lives With: Spouse Available Help at Discharge: Family Type of Home: House Home Access: Stairs to enter Secretary/administrator of Steps: 1 Entrance Stairs-Rails: None Home Layout: One level Bathroom Shower/Tub: Walk-in shower;Door [employer] Toilet:  Handicapped height Bathroom Accessibility: Yes How Accessible: Accessible via walker Home Adaptive Equipment: Walker - standard;Raised toilet seat  with rails Prior Function Level of Independence: Independent Able to Take Stairs?: Yes Driving: Yes Vocation: Full time employment Actor) Communication Communication: No difficulties Dominant Hand: Right    Cognition  Overall Cognitive Status: Appears within functional limits for tasks assessed/performed Arousal/Alertness: Awake/alert Orientation Level: Appears intact for tasks assessed Behavior During Session: [facility] for tasks performed    Extremity/Trunk Assessment Right Lower Extremity Assessment RLE ROM/Strength/Tone: Within functional levels Left Lower Extremity Assessment LLE ROM/Strength/Tone: Deficits;Unable to fully assess;Due to precautions;Due to pain LLE ROM/Strength/Tone Deficits: At least 2+/5 gross   Balance    End of Session PT - End of Session Equipment Utilized During Treatment: Gait belt Activity Tolerance: Patient tolerated treatment well Patient left: in chair;with call bell/phone within reach;with family/visitor present Nurse Communication: Mobility status  GP     Doneen Ollinger 02/26/2012, 12:50 PM Jake Shark, PT DPT [phone]

## 2012-02-26 NOTE — Progress Notes (Signed)
ANTICOAGULATION CONSULT NOTE - Follow up Consult  Pharmacy Consult for Coumadin Indication: VTE prophylaxis, A.fib  No Known Allergies  Patient Measurements: Height: 5' 8.11" (173 cm) Weight: 200 lb 9.9 oz (91 kg) IBW/kg (Calculated) : 68.65   Vital Signs: Temp: 98 F (36.7 C) (10/29 0629) BP: 117/77 mmHg (10/29 0629) Pulse Rate: 73  (10/29 0629)  Labs:  Basename 02/26/12 0710 02/25/12 1015  HGB 13.3 --  HCT 39.2 --  PLT 156 --  APTT -- 37  LABPROT 17.3* 15.2  INR 1.46 1.22  HEPARINUNFRC -- --  CREATININE -- --  CKTOTAL -- --  CKMB -- --  TROPONINI -- --    Estimated Creatinine Clearance: 88.9 ml/min (by C-G formula based on Cr of 0.71).   Assessment: 76 yr old male s/p revision of the left total hip on 02/25/12 which had dislocated twice. Pt was on coumadin PTA for AFib and home dose was 7.5 mg daily except 5 mg on Sat, confirmed home dose with patient. INR of 1.22 on admission. Pt now to continue home coumadin for VTE proxphylaxis and AFib. INR today remains subtherapeutic but increasing towards goal after one dose of 10 mg. Hgb 13.3 decreased postop but ok, plt 156 stable.   Goal of Therapy:  INR 2-3 Monitor platelets by anticoagulation protocol: Yes   Plan:  1) Coumadin 7.5 mg x1 tonight 2) Daily PT/INR 3) Monitor for s/sx of bleeding 4) Provided re-education on Coumadin, handout  Regino Schultze, Yolanda Dockendorf L 02/26/2012,8:41 AM

## 2012-02-26 NOTE — Progress Notes (Signed)
CARE MANAGEMENT NOTE 02/26/2012  Patient:  Andrew Vance, Andrew Vance   Account Number:  [id number]  Date Initiated:  02/26/2012  Documentation initiated by:  Vance Peper  Subjective/Objective Assessment:   72 yr young male s/p Left total hip revision.     Action/Plan:   CM spoke with patient concerning home health and DME needs. States he has a rolling walker and will not need HH therapy. Has appt at coumadin clinic next week and is returning to work on Monday. Wife for support.   Anticipated DC Date:  02/27/2012   Anticipated DC Plan:  HOME/SELF CARE      DC Planning Services  CM consult      Choice offered to / List presented to:  C-1 Patient           Status of service:  Completed, signed off Medicare Important Message given?   (If response is "NO", the following Medicare IM given date fields will be blank) Date Medicare IM given:   Date Additional Medicare IM given:    Discharge Disposition:  HOME/SELF CARE  Per UR Regulation:    If discussed at Long Length of Stay Meetings, dates discussed:    Comments:

## 2012-02-26 NOTE — Progress Notes (Signed)
UR COMPLETED  

## 2012-02-26 NOTE — Progress Notes (Signed)
Referred to CSW today for ?SNF. Chart reviewed and per [facility] note, plan is d/c home with Ouptatient PT.and DME. CSW to sign off- please contact us if SW needs arise. Reece Levy, MSW, Theresia Majors [phone]

## 2012-02-26 NOTE — Progress Notes (Signed)
Foley d/c at 0630, pt due to void. Endorsed to on coming RN.

## 2012-02-26 NOTE — Progress Notes (Signed)
Patient ID: Andrew Vance, male   DOB: 08-11-1935, 76 y.o.   MRN: [id number] PATIENT ID: Andrew Vance  MRN: [id number]  DOB/AGE:  09-06-1935 / 76 y.o.  1 Day Post-Op Procedure(s) (LRB): TOTAL HIP REVISION (Left)    PROGRESS NOTE Subjective: Patient is alert, oriented,no Nausea, no Vomiting, yes passing gas, no Bowel Movement. Taking PO well. Denies SOB, Chest or Calf Pain. Using Incentive Spirometer, PAS in place. Ambulate WBAT today Patient reports pain as 7 on 0-10 scale  .    Objective: Vital signs in last 24 hours: Filed Vitals:   02/25/12 1704 02/25/12 2000 02/26/12 0000 02/26/12 0400  BP: 149/91     Pulse: 81     Temp: 97.7 F (36.5 C)     TempSrc:      Resp: 18 17 20 18   Height: 5' 8.11" (1.73 m)     Weight: 91 kg (200 lb 9.9 oz)     SpO2: 99% 96% 97% 97%      Intake/Output from previous day: I/O last 3 completed shifts: In: 1710 [P.O.:210; I.V.:1500] Out: 575 [Urine:375; Blood:200]   Intake/Output this shift:     LABORATORY DATA:  Basename 02/25/12 1015  WBC --  HGB --  HCT --  PLT --  NA --  K --  CL --  CO2 --  BUN --  CREATININE --  GLUCOSE --  GLUCAP --  INR 1.22  CALCIUM --    Examination: Neurologically intact ABD soft Neurovascular intact Sensation intact distally Intact pulses distally Dorsiflexion/Plantar flexion intact Incision: moderate drainage No cellulitis present Compartment soft} XR AP&Lat of hip shows well placed\fixed THA  Assessment:   1 Day Post-Op Procedure(s) (LRB): TOTAL HIP REVISION (Left) ADDITIONAL DIAGNOSIS:    Plan: PT/OT WBAT, THA  posterior precautions  DVT Prophylaxis: SCDx72 hrs, ASA 325 mg BID x 2 weeks  DISCHARGE PLAN: Home  DISCHARGE NEEDS: HHPT, HHRN, CPM, Walker and 3-in-1 comode seat

## 2012-02-27 DIAGNOSIS — Z96649 Presence of unspecified artificial hip joint: Secondary | ICD-10-CM

## 2012-02-27 LAB — CBC
HCT: 37.1 % — ABNORMAL LOW (ref 39.0–52.0)
Platelets: 135 10*3/uL — ABNORMAL LOW (ref 150–400)
RBC: 3.96 MIL/uL — ABNORMAL LOW (ref 4.22–5.81)
RDW: 13.3 % (ref 11.5–15.5)
WBC: 9.6 10*3/uL (ref 4.0–10.5)

## 2012-02-27 LAB — PROTIME-INR: INR: 1.87 — ABNORMAL HIGH (ref 0.00–1.49)

## 2012-02-27 MED ORDER — OXYCODONE-ACETAMINOPHEN 5-325 MG PO TABS: 1.0000 | ORAL_TABLET | ORAL | Status: DC | PRN

## 2012-02-27 MED ORDER — METHOCARBAMOL 500 MG PO TABS: 500.0000 mg | ORAL_TABLET | Freq: Four times a day (QID) | ORAL | Status: DC | PRN

## 2012-02-27 NOTE — Progress Notes (Signed)
Patient discharge in stable condition via wheelchair. Discharge instructions and prescriptions were given and explained.

## 2012-02-27 NOTE — Evaluation (Signed)
I agree with the following treatment note after reviewing documentation.   Johnston, Bernese Doffing Brynn   OTR/L Pager: [phone] Office: [phone] .   

## 2012-02-27 NOTE — Progress Notes (Signed)
Physical Therapy Treatment Patient Details Name: RUVEN CORRADI MRN: [id number] DOB: 1935/09/13 Today's Date: 02/27/2012 Time: 2725-3664 PT Time Calculation (min): 24 min  PT Assessment / Plan / Recommendation Comments on Treatment Session  POD #2 L THR progressing very well. Pt aware of his THP as I sked hime to bend over to fix his sock.  Pt responded "I'm not suppose to". Pt very knowledgable. Pt plans to D/C to home with his spouse.    Follow Up Recommendations        Does the patient have the potential to tolerate intense rehabilitation     Barriers to Discharge        Equipment Recommendations  None recommended by OT    Recommendations for Other Services    Frequency 7X/week   Plan Discharge plan remains appropriate    Precautions / Restrictions Precautions Precautions: Posterior Hip;Fall Precaution Booklet Issued: Yes (comment) Precaution Comments: Pt aware of his THP and WB status Restrictions Weight Bearing Restrictions: No LLE Weight Bearing: Weight bearing as tolerated    Pertinent Vitals/Pain C/o "soreness and swelling" ICE applied    Mobility  Bed Mobility Bed Mobility: Not assessed Details for Bed Mobility Assistance: Pt OOB in recliner  Transfers Transfers: Sit to Stand;Stand to Sit Sit to Stand: 5: Supervision;From chair/3-in-1 Stand to Sit: 5: Supervision;To chair/3-in-1 Details for Transfer Assistance: good safety cognition and use of hands  Ambulation/Gait Ambulation/Gait Assistance: 5: Supervision Ambulation Distance (Feet): 225 Feet Assistive device: Rolling walker Ambulation/Gait Assistance Details: <25 % VC's on safety with turns and backward gait. Gait Pattern: Step-to pattern Gait velocity: decreased General Gait Details: Pt very knowledgable and had hid first THR 18 years ago,    PT Goals                  progressing    Visit Information  Last PT Received On: 02/27/12 Assistance Needed: +1    Subjective Data       Cognition  Overall Cognitive Status: Appears within functional limits for tasks assessed/performed Arousal/Alertness: Awake/alert Orientation Level: Appears intact for tasks assessed Behavior During Session: [facility] for tasks performed    Balance   good  End of Session PT - End of Session Equipment Utilized During Treatment: Gait belt Activity Tolerance: Patient tolerated treatment well Patient left: in chair Nurse Communication: Other (comment) (Pt ready for D/C to home)   Felecia Shelling  PTA [facility]  Acute  Rehab Pager     [phone]

## 2012-02-27 NOTE — Discharge Summary (Signed)
Patient ID: Andrew Vance MRN: [id number] DOB/AGE: 09-12-1935 76 y.o.  Admit date: 02/25/2012 Discharge date: 02/27/2012  Admission Diagnoses:  Principal Problem:  *Failed total hip arthroplasty with dislocation   Discharge Diagnoses:  Same  Past Medical History  Diagnosis Date  . Psoriatic arthritis   . GERD (gastroesophageal reflux disease)   . Hypertension   . Atrial fibrillation     sees Dr. Sherryl Manges  . Tachycardia-bradycardia syndrome   . GI bleed     hx  . Hyperlipidemia   . Pacemaker   . H/O hiatal hernia   . Arthritis   . Anemia, iron deficiency     hx  . GI bleed     hx  . Complication of anesthesia     BP elevated with spinal     Surgeries: Procedure(s): TOTAL HIP REVISION on 02/25/2012   Consultants:    Discharged Condition: Improved  Hospital Course: [name] is an 76 y.o. male who was admitted 02/25/2012 for operative treatment ofFailed total hip arthroplasty with dislocation. Patient has severe unremitting pain that affects sleep, daily activities, and work/hobbies. After pre-op clearance the patient was taken to the operating room on 02/25/2012 and underwent  Procedure(s): TOTAL HIP REVISION.    Patient was given perioperative antibiotics: Anti-infectives     Start     Dose/Rate Route Frequency Ordered Stop   02/24/12 1438   ceFAZolin (ANCEF) IVPB 2 g/50 mL premix        2 g 100 mL/hr over 30 Minutes Intravenous 60 min pre-op 02/24/12 1438 02/25/12 1316           Patient was given sequential compression devices, early ambulation, and chemoprophylaxis to prevent DVT.  Patient benefited maximally from hospital stay and there were no complications.    Recent vital signs: Patient Vitals for the past 24 hrs:  BP Temp Pulse Resp SpO2  02/27/12 0741 104/67 mmHg 98.1 F (36.7 C) 18  18  98 %  02/27/12 0400 - - - 20  97 %  02/27/12 0000 - - - 18  96 %  03-02-2012 2135 142/77 mmHg 99.7 F (37.6 C) 108  16  97 %  March 02, 2012 2000 - -  - 19  96 %  Mar 02, 2012 1600 - - - 16  99 %  Mar 02, 2012 1400 117/71 mmHg 98.1 F (36.7 C) 88  20  98 %  March 02, 2012 1200 - - - 18  100 %  March 02, 2012 0900 145/87 mmHg - - - -  March 02, 2012 0800 - - - 16  99 %     Recent laboratory studies:  Basename 02/27/12 0547 03-02-12 0710  WBC 9.6 9.4  HGB 12.7* 13.3  HCT 37.1* 39.2  PLT 135* 156  NA -- 137  K -- 4.1  CL -- 100  CO2 -- 26  BUN -- 11  CREATININE -- 0.83  GLUCOSE -- 118*  INR 1.87* 1.46  CALCIUM -- 8.8     Discharge Medications:     Medication List     As of 02/27/2012  7:53 AM    TAKE these medications         budesonide-formoterol 160-4.5 MCG/ACT inhaler   Commonly known as: SYMBICORT   Inhale 2 puffs into the lungs as needed. Wheezing      calcipotriene 0.005 % cream   Commonly known as: DOVONOX   Apply 1 application topically 2 (two) times daily as needed. rash      carvedilol 25 MG tablet  Commonly known as: COREG   Take 25 mg by mouth 2 (two) times daily with a meal.      lisinopril 10 MG tablet   Commonly known as: PRINIVIL,ZESTRIL   Take 10 mg by mouth daily.      Lutein 20 MG Caps   Take by mouth at bedtime.      methocarbamol 500 MG tablet   Commonly known as: ROBAXIN   Take 1 tablet (500 mg total) by mouth every 6 (six) hours as needed.      multivitamin capsule   Take 1 capsule by mouth daily.      oxyCODONE-acetaminophen 5-325 MG per tablet   Commonly known as: PERCOCET/ROXICET   Take 1-2 tablets by mouth every 4 (four) hours as needed for pain.      potassium chloride SA 20 MEQ tablet   Commonly known as: K-DUR,KLOR-CON   Take 20 mEq by mouth daily.      rosuvastatin 10 MG tablet   Commonly known as: CRESTOR   Take 10 mg by mouth daily.      warfarin 5 MG tablet   Commonly known as: COUMADIN   Take 5-7.5 mg by mouth daily. Takes 7.5 mg all days except Saturday takes 5 mg      warfarin 5 MG tablet   Commonly known as: COUMADIN   Take as directed by anticoagulation clinic         Diagnostic Studies: Dg Chest 2 View  02/18/2012  *RADIOLOGY REPORT*  Clinical Data: Preop left total hip revision  CHEST - 2 VIEW  Comparison: 05/26/2010  Findings: Lungs are essentially clear. No pleural effusion or pneumothorax.  Stable mild cardiomegaly.  Left subclavian dual lead pacemaker.  Mild degenerative changes of the visualized thoracolumbar spine.  IMPRESSION: No evidence of acute cardiopulmonary disease.   Original Report Authenticated By: Charline Bills, M.D.    Dg Pelvis Portable  02/25/2012  *RADIOLOGY REPORT*  Clinical Data: Postop left hip replacement  PORTABLE PELVIS  Comparison: None.  Findings: Recent left hip replacement in satisfactory position and alignment.  No acute complication.  Right hip replacement also in satisfactory position and alignment.  IMPRESSION: Satisfactory bilateral hip replacement.   Original Report Authenticated By: Camelia Phenes, M.D.    Dg Hip Portable 1 View Left  02/25/2012  *RADIOLOGY REPORT*  Clinical Data: Left hip replacement  PORTABLE LEFT HIP - 1 VIEW  Comparison: None.  Findings: Lateral view the left hip reveals satisfactory position and  alignment.  Left hip replacement without complication.  IMPRESSION: Satisfactory left hip replacement.   Original Report Authenticated By: Camelia Phenes, M.D.     Disposition:       Discharge Orders    Future Appointments: Provider: Department: Dept Phone: Center:   03/05/2012 7:45 AM [facility] [phone] None   03/17/2012 9:35 AM [facility] [phone] [facility]     Future Orders Please Complete By Expires   Increase activity slowly      Walker       May shower / Bathe      Driving Restrictions      Comments:   No driving for 2 weeks.   Change dressing (specify)      Comments:   Dressing change as needed.   Call MD for:  temperature >100.4      Call MD for:  severe uncontrolled pain      Call MD for:   redness, tenderness, or signs  of infection (pain, swelling, redness, odor or green/yellow discharge around incision site)      Discharge instructions      Comments:   F/U with Dr. Turner Daniels as scheduled (POD #14).         SignedHazle Nordmann. 02/27/2012, 7:53 AM

## 2012-02-27 NOTE — Progress Notes (Signed)
PATIENT ID: Andrew Vance  MRN: [id number]  DOB/AGE:  Jan 24, 1936 / 76 y.o.  2 Days Post-Op Procedure(s) (LRB): TOTAL HIP REVISION (Left)    PROGRESS NOTE Subjective: Patient is alert, oriented,no Nausea, no Vomiting, yes passing gas, no Bowel Movement. Taking PO well. Denies SOB, Chest or Calf Pain. Using Incentive Spirometer, PAS in place. Ambulating well with PT. Patient reports pain as moderate  .    Objective: Vital signs in last 24 hours: Filed Vitals:   02/26/12 2135 02/27/12 0000 02/27/12 0400 02/27/12 0741  BP: 142/77   104/67  Pulse: 108   18  Temp: 99.7 F (37.6 C)   98.1 F (36.7 C)  TempSrc:      Resp: 16 18 20 18   Height:      Weight:      SpO2: 97% 96% 97% 98%      Intake/Output from previous day: I/O last 3 completed shifts: In: 1845 [P.O.:1080; I.V.:765] Out: 1175 [Urine:1175]   Intake/Output this shift:     LABORATORY DATA:  Basename 02/27/12 0547 02/26/12 0710  WBC 9.6 9.4  HGB 12.7* 13.3  HCT 37.1* 39.2  PLT 135* 156  NA -- 137  K -- 4.1  CL -- 100  CO2 -- 26  BUN -- 11  CREATININE -- 0.83  GLUCOSE -- 118*  GLUCAP -- --  INR 1.87* 1.46  CALCIUM -- 8.8    Examination: Neurologically intact ABD soft Neurovascular intact Sensation intact distally Intact pulses distally Dorsiflexion/Plantar flexion intact Incision: moderate drainage} XR AP&Lat of hip shows well placed\fixed THA  Assessment:   2 Days Post-Op Procedure(s) (LRB): TOTAL HIP REVISION (Left) ADDITIONAL DIAGNOSIS:  none  Plan: PT/OT WBAT, THA  posterior precautions  Dressing change  DVT Prophylaxis:  Coumadin per home schedule.  DISCHARGE PLAN: Home today  DISCHARGE NEEDS: HHPT, HHRN, Walker and 3-in-1 comode seat

## 2012-02-27 NOTE — Progress Notes (Signed)
I agree with the following treatment note after reviewing documentation.   Johnston, Kenndra Morris Brynn   OTR/L Pager: [phone] Office: [phone] .   

## 2012-02-27 NOTE — Progress Notes (Signed)
Occupational Therapy Discharge Patient Details Name: Andrew Vance MRN: [id number] DOB: [birth year] Today's Date: 02/27/2012 Time: 8295-6213 OT Time Calculation (min): 12 min  Patient discharged from OT services secondary to Pt demonstrates transfers and ambulation at supervision level. Wife is able to (A) with needs at home. Has purchased AE for LB bathing and dressing. No further acute OT needs.  Please see latest therapy progress note for current level of functioning and progress toward goals.    Progress and discharge plan discussed with patient and/or caregiver: Patient/Caregiver agrees with plan  GO     Cleora Fleet 02/27/2012, 11:09 AM

## 2012-02-27 NOTE — Evaluation (Signed)
Occupational Therapy Evaluation Patient Details Name: Andrew Vance MRN: [id number] DOB: 07-26-1935 Today's Date: 02/27/2012 Time: 3244-0102 OT Time Calculation (min): 12 min  OT Assessment / Plan / Recommendation Clinical Impression  Pt. 76 yo male s/p left hip revision. Pt is doing very well, wife is able to (A) with LB ADL needs at home. Has already purchased AE to increase independence. OT to sign off    OT Assessment  Patient does not need any further OT services    Follow Up Recommendations  Supervision - Intermittent    Barriers to Discharge      Equipment Recommendations  None recommended by OT    Recommendations for Other Services    Frequency       Precautions / Restrictions Precautions Precautions: Posterior Hip;Fall Precaution Booklet Issued: Yes (comment) Precaution Comments: Able to verbalize 3/3 precautions Restrictions Weight Bearing Restrictions: Yes LLE Weight Bearing: Weight bearing as tolerated   Pertinent Vitals/Pain None reported    ADL  Grooming: Performed;Wash/dry hands;Supervision/safety Where Assessed - Grooming: Unsupported standing Toilet Transfer: Buyer, retail Method: Sit to stand Tub/Shower Transfer: Landscape architect Method: Science writer: Walk in Scientist, research (physical sciences) Used: Gait belt;Rolling walker Transfers/Ambulation Related to ADLs: Supervision level for transfers and ambulation  ADL Comments: Pt able to recall 3/3 hip precautions. Ambulated to sink to wash hands. Pt already dressed prior to OT entering room. Pt and wife stated he required (A) with getting his legs in his pants and putting his socks on. They have already purchased a sock aide and reacher for use at home to (A) with LB bathing and dressing. Simulated walk in shower transfer at supervision level with vc's for sequencing.      OT Diagnosis:    OT Problem List:   OT Treatment  Interventions:     OT Goals    Visit Information  Last OT Received On: 02/27/12 Assistance Needed: +1    Subjective Data  Subjective: I just want to go home and take a nap  Patient Stated Goal: To go home today    Prior Functioning     Home Living Lives With: Spouse Available Help at Discharge: Family Type of Home: House Home Access: Stairs to enter Secretary/administrator of Steps: 1 Entrance Stairs-Rails: None Home Layout: One level Bathroom Shower/Tub: Walk-in shower;Door [employer] Toilet: Handicapped height Bathroom Accessibility: Yes How Accessible: Accessible via walker Home Adaptive Equipment: Walker - standard;Raised toilet seat with rails Prior Function Level of Independence: Independent Able to Take Stairs?: Yes Driving: Yes Vocation: Full time employment Communication Communication: No difficulties Dominant Hand: Right         Vision/Perception     Cognition  Overall Cognitive Status: Appears within functional limits for tasks assessed/performed Arousal/Alertness: Awake/alert Orientation Level: Appears intact for tasks assessed Behavior During Session: [name] for tasks performed    Extremity/Trunk Assessment Right Upper Extremity Assessment RUE ROM/Strength/Tone: Within functional levels Left Upper Extremity Assessment LUE ROM/Strength/Tone: Within functional levels     Mobility Bed Mobility Bed Mobility: Not assessed Transfers Transfers: Sit to Stand;Stand to Sit Sit to Stand: 5: Supervision;With upper extremity assist;With armrests;From chair/3-in-1 Stand to Sit: 5: Supervision;With upper extremity assist;With armrests;To chair/3-in-1 Details for Transfer Assistance: Did very well required no cues for proper sequencing or hand placement.                    End of Session OT - End of Session Equipment Utilized During Treatment: Gait belt Activity Tolerance: Patient tolerated  treatment well Patient left: in chair;with call bell/phone  within reach;with family/visitor present Nurse Communication: Mobility status  GO     Cleora Fleet 02/27/2012, 11:09 AM

## 2012-02-28 ENCOUNTER — Encounter (HOSPITAL_COMMUNITY): Payer: Self-pay | Admitting: Orthopedic Surgery

## 2012-02-28 LAB — TISSUE CULTURE: Culture: NO GROWTH

## 2012-03-01 LAB — ANAEROBIC CULTURE: Gram Stain: NONE SEEN

## 2012-03-05 ENCOUNTER — Ambulatory Visit (INDEPENDENT_AMBULATORY_CARE_PROVIDER_SITE_OTHER): Payer: BC Managed Care – PPO | Admitting: *Deleted

## 2012-03-05 DIAGNOSIS — I4891 Unspecified atrial fibrillation: Secondary | ICD-10-CM

## 2012-03-05 LAB — POCT INR: INR: 3.6

## 2012-03-17 ENCOUNTER — Ambulatory Visit (INDEPENDENT_AMBULATORY_CARE_PROVIDER_SITE_OTHER): Payer: BC Managed Care – PPO | Admitting: *Deleted

## 2012-03-17 ENCOUNTER — Encounter: Payer: Self-pay | Admitting: Internal Medicine

## 2012-03-17 DIAGNOSIS — Z950 Presence of cardiac pacemaker: Secondary | ICD-10-CM

## 2012-03-17 DIAGNOSIS — I495 Sick sinus syndrome: Secondary | ICD-10-CM

## 2012-03-18 LAB — REMOTE PACEMAKER DEVICE
BAMS-0001: 150 {beats}/min
BAMS-0003: 70 {beats}/min
BATTERY VOLTAGE: 2.95 V
RV LEAD AMPLITUDE: 12 mv
RV LEAD IMPEDENCE PM: 650 Ohm
VENTRICULAR PACING PM: 45

## 2012-03-26 ENCOUNTER — Ambulatory Visit (INDEPENDENT_AMBULATORY_CARE_PROVIDER_SITE_OTHER): Payer: BC Managed Care – PPO | Admitting: *Deleted

## 2012-03-26 DIAGNOSIS — I4891 Unspecified atrial fibrillation: Secondary | ICD-10-CM

## 2012-03-26 LAB — POCT INR: INR: 2.4

## 2012-04-09 ENCOUNTER — Encounter: Payer: Self-pay | Admitting: *Deleted

## 2012-04-24 ENCOUNTER — Ambulatory Visit (INDEPENDENT_AMBULATORY_CARE_PROVIDER_SITE_OTHER): Payer: BC Managed Care – PPO

## 2012-04-24 DIAGNOSIS — I4891 Unspecified atrial fibrillation: Secondary | ICD-10-CM

## 2012-04-24 LAB — POCT INR: INR: 2.4

## 2012-05-29 ENCOUNTER — Ambulatory Visit (INDEPENDENT_AMBULATORY_CARE_PROVIDER_SITE_OTHER): Payer: BC Managed Care – PPO | Admitting: *Deleted

## 2012-05-29 DIAGNOSIS — I4891 Unspecified atrial fibrillation: Secondary | ICD-10-CM

## 2012-05-29 LAB — POCT INR: INR: 2.8

## 2012-05-29 MED ORDER — WARFARIN SODIUM 5 MG PO TABS: ORAL_TABLET | ORAL | Status: DC

## 2012-06-14 ENCOUNTER — Other Ambulatory Visit: Payer: Self-pay | Admitting: Family Medicine

## 2012-06-14 ENCOUNTER — Other Ambulatory Visit: Payer: Self-pay | Admitting: Internal Medicine

## 2012-06-23 ENCOUNTER — Telehealth: Payer: Self-pay | Admitting: Family Medicine

## 2012-06-23 ENCOUNTER — Other Ambulatory Visit: Payer: Self-pay | Admitting: Internal Medicine

## 2012-06-23 ENCOUNTER — Encounter: Payer: Self-pay | Admitting: Internal Medicine

## 2012-06-23 ENCOUNTER — Ambulatory Visit (INDEPENDENT_AMBULATORY_CARE_PROVIDER_SITE_OTHER): Payer: BC Managed Care – PPO | Admitting: *Deleted

## 2012-06-23 DIAGNOSIS — I495 Sick sinus syndrome: Secondary | ICD-10-CM

## 2012-06-23 DIAGNOSIS — Z950 Presence of cardiac pacemaker: Secondary | ICD-10-CM

## 2012-06-23 NOTE — Telephone Encounter (Signed)
Refill request for Symbicort 160-4.5 mcg inhaler ( gm 10.19 )

## 2012-06-23 NOTE — Telephone Encounter (Signed)
Okay for one year  

## 2012-06-24 MED ORDER — BUDESONIDE-FORMOTEROL FUMARATE 160-4.5 MCG/ACT IN AERO: 2.0000 | INHALATION_SPRAY | RESPIRATORY_TRACT | Status: DC | PRN

## 2012-06-24 NOTE — Telephone Encounter (Signed)
I sent script e-scribe. 

## 2012-06-27 ENCOUNTER — Encounter: Payer: Self-pay | Admitting: *Deleted

## 2012-07-01 ENCOUNTER — Telehealth: Payer: Self-pay | Admitting: Internal Medicine

## 2012-07-01 NOTE — Telephone Encounter (Signed)
New Problem:    Patient called in because he received a letter stating that he missed his last transmission.  Please call back.

## 2012-07-01 NOTE — Telephone Encounter (Signed)
Transmission received, patient aware. 

## 2012-07-02 LAB — REMOTE PACEMAKER DEVICE
BAMS-0003: 70 {beats}/min
BRDY-0003RV: 90 {beats}/min
BRDY-0004RV: 110 {beats}/min
DEVICE MODEL PM: 2276567
RV LEAD AMPLITUDE: 12 mv
RV LEAD IMPEDENCE PM: 650 Ohm

## 2012-07-03 ENCOUNTER — Other Ambulatory Visit: Payer: Self-pay | Admitting: Family Medicine

## 2012-07-10 ENCOUNTER — Ambulatory Visit (INDEPENDENT_AMBULATORY_CARE_PROVIDER_SITE_OTHER): Payer: PRIVATE HEALTH INSURANCE | Admitting: *Deleted

## 2012-07-10 DIAGNOSIS — I4891 Unspecified atrial fibrillation: Secondary | ICD-10-CM

## 2012-07-10 LAB — POCT INR: INR: 3

## 2012-07-14 ENCOUNTER — Other Ambulatory Visit: Payer: Self-pay | Admitting: *Deleted

## 2012-07-14 MED ORDER — ROSUVASTATIN CALCIUM 10 MG PO TABS: 10.0000 mg | ORAL_TABLET | Freq: Every day | ORAL | Status: DC

## 2012-07-25 ENCOUNTER — Encounter: Payer: Self-pay | Admitting: *Deleted

## 2012-07-29 ENCOUNTER — Other Ambulatory Visit (INDEPENDENT_AMBULATORY_CARE_PROVIDER_SITE_OTHER): Payer: PRIVATE HEALTH INSURANCE

## 2012-07-29 DIAGNOSIS — Z0000 Encounter for general adult medical examination without abnormal findings: Secondary | ICD-10-CM

## 2012-07-29 LAB — CBC WITH DIFFERENTIAL/PLATELET
Basophils Absolute: 0 10*3/uL (ref 0.0–0.1)
Eosinophils Relative: 1.9 % (ref 0.0–5.0)
HCT: 45.4 % (ref 39.0–52.0)
Hemoglobin: 15.3 g/dL (ref 13.0–17.0)
Lymphocytes Relative: 18.7 % (ref 12.0–46.0)
Lymphs Abs: 1.2 10*3/uL (ref 0.7–4.0)
Monocytes Relative: 9.2 % (ref 3.0–12.0)
Platelets: 175 10*3/uL (ref 150.0–400.0)
RDW: 14.5 % (ref 11.5–14.6)
WBC: 6.4 10*3/uL (ref 4.5–10.5)

## 2012-07-29 LAB — HEPATIC FUNCTION PANEL
ALT: 19 U/L (ref 0–53)
AST: 23 U/L (ref 0–37)
Albumin: 4 g/dL (ref 3.5–5.2)

## 2012-07-29 LAB — BASIC METABOLIC PANEL
CO2: 25 mEq/L (ref 19–32)
Calcium: 9.4 mg/dL (ref 8.4–10.5)
Chloride: 104 mEq/L (ref 96–112)
Glucose, Bld: 98 mg/dL (ref 70–99)
Sodium: 139 mEq/L (ref 135–145)

## 2012-07-29 LAB — LIPID PANEL
HDL: 42.9 mg/dL (ref >39.00)
LDL Cholesterol: 66 mg/dL (ref 0–99)
Total CHOL/HDL Ratio: 3
Triglycerides: 71 mg/dL (ref 0.0–149.0)
VLDL: 14.2 mg/dL (ref 0.0–40.0)

## 2012-07-29 LAB — POCT URINALYSIS DIPSTICK
Leukocytes, UA: NEGATIVE
Nitrite, UA: NEGATIVE
Protein, UA: NEGATIVE
Urobilinogen, UA: 0.2
pH, UA: 7

## 2012-07-30 NOTE — Progress Notes (Signed)
Quick Note:  Pt has appointment on 08/05/12 will go over then. ______

## 2012-08-05 ENCOUNTER — Encounter: Payer: Self-pay | Admitting: Family Medicine

## 2012-08-05 ENCOUNTER — Ambulatory Visit (INDEPENDENT_AMBULATORY_CARE_PROVIDER_SITE_OTHER): Payer: PRIVATE HEALTH INSURANCE | Admitting: Family Medicine

## 2012-08-05 VITALS — BP 134/76 | HR 84 | Temp 98.7°F | Ht 68.0 in | Wt 198.0 lb

## 2012-08-05 DIAGNOSIS — Z0000 Encounter for general adult medical examination without abnormal findings: Secondary | ICD-10-CM

## 2012-08-05 MED ORDER — FLUOCINOLONE ACETONIDE 0.01 % EX SOLN: Freq: Two times a day (BID) | CUTANEOUS | Status: DC

## 2012-08-05 NOTE — Progress Notes (Signed)
  Subjective:    Patient ID: Andrew Vance, male    DOB: Oct 28, 1935, 77 y.o.   MRN: [id number]  HPI 77 yr old male for a cpx. He feels well and has only one concern. He has psoriasis and has used Synalar on his scalp in the past. He asks for a new rx.    Review of Systems  Constitutional: Negative.   HENT: Negative.   Eyes: Negative.   Respiratory: Negative.   Cardiovascular: Negative.   Gastrointestinal: Negative.   Genitourinary: Negative.   Musculoskeletal: Negative.   Skin: Negative.   Neurological: Negative.   Psychiatric/Behavioral: Negative.        Objective:   Physical Exam  Constitutional: He is oriented to person, place, and time. He appears well-developed and well-nourished. No distress.  HENT:  Head: Normocephalic and atraumatic.  Right Ear: External ear normal.  Left Ear: External ear normal.  Nose: Nose normal.  Mouth/Throat: Oropharynx is clear and moist. No oropharyngeal exudate.  Eyes: Conjunctivae and EOM are normal. Pupils are equal, round, and reactive to light. Right eye exhibits no discharge. Left eye exhibits no discharge. No scleral icterus.  Neck: Neck supple. No JVD present. No tracheal deviation present. No thyromegaly present.  Cardiovascular: Normal rate, normal heart sounds and intact distal pulses.  Exam reveals no gallop and no friction rub.   No murmur heard. Irregular rhythm   Pulmonary/Chest: Effort normal and breath sounds normal. No respiratory distress. He has no wheezes. He has no rales. He exhibits no tenderness.  Abdominal: Soft. Bowel sounds are normal. He exhibits no distension and no mass. There is no tenderness. There is no rebound and no guarding.  Genitourinary: Rectum normal, prostate normal and penis normal. Guaiac negative stool. No penile tenderness.  Musculoskeletal: Normal range of motion. He exhibits no edema and no tenderness.  Lymphadenopathy:    He has no cervical adenopathy.  Neurological: He is alert and oriented to  person, place, and time. He has normal reflexes. No cranial nerve deficit. He exhibits normal muscle tone. Coordination normal.  Skin: Skin is warm and dry. No rash noted. He is not diaphoretic. No erythema. No pallor.  Psychiatric: He has a normal mood and affect. His behavior is normal. Judgment and thought content normal.          Assessment & Plan:  Well exam. Use Synalar prn

## 2012-08-06 ENCOUNTER — Encounter: Payer: Self-pay | Admitting: Family Medicine

## 2012-08-08 MED ORDER — LISINOPRIL 10 MG PO TABS: 10.0000 mg | ORAL_TABLET | Freq: Every day | ORAL | Status: DC

## 2012-08-08 NOTE — Telephone Encounter (Signed)
Add [facility] to chart.

## 2012-08-12 ENCOUNTER — Telehealth: Payer: Self-pay | Admitting: Internal Medicine

## 2012-08-12 NOTE — Telephone Encounter (Signed)
Pt instructed to talk with dentist to find out if he needs to be off Coumadin and then need to make Dr Graciela Husbands aware of this procedure. Procedure scheduled for next week. Pt states he will call Dentist today to and will let us know

## 2012-08-12 NOTE — Telephone Encounter (Signed)
PT called and states dentist  said minimal bleeding and needs to be off  Coumadin for 3 days. Procedure scheduled for August 19, 2012  Is he cleared to hold Coumadin ?

## 2012-08-12 NOTE — Telephone Encounter (Signed)
New Problem:    Patient called in because he has an upcoming dental appointment and would like to know how to proceed with his warfarin (COUMADIN) 5 MG tablet.  Please call back.

## 2012-08-13 NOTE — Telephone Encounter (Signed)
Talked with Drinda Butts at Dr Melynda Ripple office and confirmed that pt is to be off coumadin 3 days prior to procedure on 08/19/2012 and that Dr Graciela Husbands states may come off coumadin for the procedure. Nurse called pt and instructed that Dr Graciela Husbands states he may hold coumadin as instructed by Dr Melynda Ripple and that last day to take coumadin is Friday 08/15/2012 and then to ask Dr Melynda Ripple when to restart and to keep his coumadin appt as scheduled. Pt verbalized understanding.

## 2012-08-13 NOTE — Telephone Encounter (Signed)
No documentation of stroke;  Ok to stop coumadin for procedure

## 2012-08-21 ENCOUNTER — Ambulatory Visit (INDEPENDENT_AMBULATORY_CARE_PROVIDER_SITE_OTHER): Payer: PRIVATE HEALTH INSURANCE

## 2012-08-21 DIAGNOSIS — I4891 Unspecified atrial fibrillation: Secondary | ICD-10-CM

## 2012-08-21 LAB — POCT INR: INR: 1.9

## 2012-09-02 ENCOUNTER — Telehealth: Payer: Self-pay | Admitting: Internal Medicine

## 2012-09-02 NOTE — Telephone Encounter (Signed)
New problem   C/o dizziness.

## 2012-09-02 NOTE — Telephone Encounter (Signed)
Returned call to patient he stated while driving car this morning he had a severe dizzy spell.Stated had to pull off road.Stated episode lasted appox 30 sec.No chest pain,no sob,no fast or irregular heart beat. Stated felt faint,did not pass out.Stated already has appointment with Dr.Taylor 09/11/12.Advised to keep appointment and call back if dizziness reoccurs.Message sent to Dr.Taylor and his nurse.

## 2012-09-11 ENCOUNTER — Ambulatory Visit (INDEPENDENT_AMBULATORY_CARE_PROVIDER_SITE_OTHER): Payer: PRIVATE HEALTH INSURANCE | Admitting: Internal Medicine

## 2012-09-11 ENCOUNTER — Encounter: Payer: Self-pay | Admitting: Internal Medicine

## 2012-09-11 VITALS — BP 132/84 | HR 82 | Ht 68.0 in | Wt 203.2 lb

## 2012-09-11 DIAGNOSIS — I495 Sick sinus syndrome: Secondary | ICD-10-CM

## 2012-09-11 DIAGNOSIS — I429 Cardiomyopathy, unspecified: Secondary | ICD-10-CM

## 2012-09-11 DIAGNOSIS — Z950 Presence of cardiac pacemaker: Secondary | ICD-10-CM

## 2012-09-11 DIAGNOSIS — I4891 Unspecified atrial fibrillation: Secondary | ICD-10-CM

## 2012-09-11 LAB — PACEMAKER DEVICE OBSERVATION
AL IMPEDENCE PM: 362.5 Ohm
BATTERY VOLTAGE: 2.9478 v
BRDY-0002RV: 60 {beats}/min
BRDY-0004RV: 110 {beats}/min
DEVICE MODEL PM: 2276567
RV LEAD IMPEDENCE PM: 625 Ohm
RV LEAD THRESHOLD: 1 v
VENTRICULAR PACING PM: 51

## 2012-09-11 NOTE — Progress Notes (Signed)
HPI Andrew Vance returns today for followup. He is a very pleasant 77 year old man with symptomatic tachycardia bradycardia syndrome, status post permanent pacemaker insertion. The patient has chronic atrial fibrillation. He also has hypertension and very severe and debilitating psoriatic arthritis. He is status post 3 hip replacements. The patient describes an episode of near-syncope which occurred approximately one week ago. His episode correlates with nonsustained ventricular tachycardia. He did not lose consciousness. He has not had chest pain. He denies shortness of breath. He has chronic peripheral edema which has not worsened. His activity is somewhat limited by arthritis. No Known Allergies   Current Outpatient Prescriptions  Medication Sig Dispense Refill  . budesonide-formoterol (SYMBICORT) 160-4.5 MCG/ACT inhaler Inhale 2 puffs into the lungs as needed. Wheezing  1 Inhaler  11  . calcipotriene (DOVONOX) 0.005 % cream Apply 1 application topically 2 (two) times daily as needed. rash      . carvedilol (COREG) 25 MG tablet Take 25 mg by mouth 2 (two) times daily with a meal.      . fluocinolone (SYNALAR) 0.01 % external solution Apply topically 2 (two) times daily.  60 mL  11  . latanoprost (XALATAN) 0.005 % ophthalmic solution Place 1 drop into both eyes at bedtime.      Marland Kitchen lisinopril (PRINIVIL,ZESTRIL) 10 MG tablet Take 1 tablet (10 mg total) by mouth daily.  30 tablet  6  . Lutein 20 MG CAPS Take by mouth at bedtime.        . Multiple Vitamin (MULTIVITAMIN) capsule Take 1 capsule by mouth daily.        . naproxen sodium (ANAPROX) 220 MG tablet Take 220 mg by mouth as needed.      . potassium chloride SA (K-DUR,KLOR-CON) 20 MEQ tablet Take 20 mEq by mouth daily.      . rosuvastatin (CRESTOR) 10 MG tablet Take 1 tablet (10 mg total) by mouth daily.  90 tablet  3  . tamsulosin (FLOMAX) 0.4 MG CAPS TAKE ONE (1) CAPSULE EACH DAY  30 capsule  1  . warfarin (COUMADIN) 5 MG tablet Take 5 mg by  mouth daily. On Sat.except all other days take 7.5 mg       No current facility-administered medications for this visit.     Past Medical History  Diagnosis Date  . Psoriatic arthritis   . GERD (gastroesophageal reflux disease)   . Hypertension   . Atrial fibrillation     sees Dr. Sherryl Manges  . Tachycardia-bradycardia syndrome   . GI bleed     hx  . Hyperlipidemia   . Pacemaker   . H/O hiatal hernia   . Arthritis   . Anemia, iron deficiency     hx  . GI bleed     hx  . Complication of anesthesia     BP elevated with spinal     ROS:   All systems reviewed and negative except as noted in the HPI.   Past Surgical History  Procedure Laterality Date  . Total hip arthroplasty  1998 and 2001    bilateral, sees Dr. Wyline Mood   . Appendectomy    . Hand surgery  1978    lft hand, fusions to DIPs and PIPs of fingers 1,2,4,and 5  . Colonoscopy  05-27-10    per Dr, Christella Hartigan, extensive diverticulosis, repeat in 5 yrs   . Esophagogastroduodenoscopy  05-27-10    per Dr. Christella Hartigan, normal   . Cardiac catheterization  09/10/08  . Pilonidal cyst excision  63  . Total hip arthroplasty  02/25/2012    left hip  . Insert / replace / remove pacemaker  2010  . Total hip revision  02/25/2012    Procedure: TOTAL HIP REVISION;  Surgeon: Nestor Lewandowsky, MD;  Location: [facility] OR;  Service: Orthopedics;  Laterality: Left;     Family History  Problem Relation Age of Onset  . Hypertension Other   . Heart disease Other      History   Social History  . Marital Status: Married    Spouse Name: N/A    Number of Children: N/A  . Years of Education: N/A   Occupational History  . Not on file.   Social History Main Topics  . Smoking status: Former Smoker -- 1.00 packs/day for 30 years    Types: Cigarettes, Pipe    Quit date: 02/17/1982  . Smokeless tobacco: Former Neurosurgeon    Quit date: 05/01/1983  . Alcohol Use: 0.5 oz/week    1 drink(s) per week  . Drug Use: No  . Sexually Active: Not on file    Other Topics Concern  . Not on file   Social History Narrative  . No narrative on file     BP 132/84  Pulse 82  Ht 5\' 8"  (1.727 m)  Wt 203 lb 3.2 oz (92.171 kg)  BMI 30.9 kg/m2  SpO2 99%  Physical Exam:  Well appearing elderly man,NAD HEENT: Unremarkable Neck:  6 cm JVD, no thyromegally Back:  No CVA tenderness Lungs:  Clear with no wheezes, rales, or rhonchi. HEART:  IRegular rate rhythm, no murmurs, no rubs, no clicks Abd:  soft, positive bowel sounds, no organomegally, no rebound, no guarding Ext:  2 plus pulses, no edema, no cyanosis, no clubbing Skin:  No rashes no nodules Neuro:  CN II through XII intact, motor grossly intact  DEVICE  Normal device function.  See PaceArt for details.   Assess/Plan:

## 2012-09-11 NOTE — Assessment & Plan Note (Signed)
His St. Jude dual-chamber pacemaker is working normally. We'll plan to recheck in several months. 

## 2012-09-11 NOTE — Assessment & Plan Note (Signed)
His ventricular rate appears to be well-controlled. No change in medical therapy at this time. 

## 2012-09-11 NOTE — Patient Instructions (Addendum)
Your physician wants you to follow-up in: 12 months with Dr Court Joy will receive a reminder letter in the mail two months in advance. If you don't receive a letter, please call our office to schedule the follow-up appointment.   Remote monitoring is used to monitor your Pacemaker of ICD from home. This monitoring reduces the number of office visits required to check your device to one time per year. It allows [place] to keep an eye on the functioning of your device to ensure it is working properly. You are scheduled for a device check from home on 12/15/12. You may send your transmission at any time that day. If you have a wireless device, the transmission will be sent automatically. After your physician reviews your transmission, you will receive a postcard with your next transmission date.

## 2012-09-11 NOTE — Assessment & Plan Note (Signed)
The patient had one episode of rapid heart rates associated with dizziness. I suspect this is ventricular tachycardia, nonsustained. Because he has no other associated symptoms, I will hold off on any additional workup. I've asked the patient to call us if his symptoms return or worsen.

## 2012-09-17 ENCOUNTER — Telehealth: Payer: Self-pay | Admitting: Family Medicine

## 2012-09-17 MED ORDER — LISINOPRIL 10 MG PO TABS: 10.0000 mg | ORAL_TABLET | Freq: Every day | ORAL | Status: DC

## 2012-09-17 NOTE — Telephone Encounter (Signed)
Refill request for Lisinopril 90 day supply, which I did send e-scribe.

## 2012-09-23 ENCOUNTER — Telehealth: Payer: Self-pay | Admitting: Family Medicine

## 2012-09-23 MED ORDER — TAMSULOSIN HCL 0.4 MG PO CAPS: 0.4000 mg | ORAL_CAPSULE | Freq: Every day | ORAL | Status: DC

## 2012-09-23 NOTE — Telephone Encounter (Signed)
Refill request for Tamsulosin 0.4 mg take 1 po qd and I did send script e-scribe.

## 2012-10-02 ENCOUNTER — Ambulatory Visit (INDEPENDENT_AMBULATORY_CARE_PROVIDER_SITE_OTHER): Payer: PRIVATE HEALTH INSURANCE | Admitting: *Deleted

## 2012-10-02 DIAGNOSIS — I4891 Unspecified atrial fibrillation: Secondary | ICD-10-CM

## 2012-10-02 LAB — POCT INR: INR: 2.8

## 2012-10-21 ENCOUNTER — Other Ambulatory Visit: Payer: Self-pay | Admitting: Emergency Medicine

## 2012-10-21 MED ORDER — POTASSIUM CHLORIDE CRYS ER 20 MEQ PO TBCR: 20.0000 meq | EXTENDED_RELEASE_TABLET | Freq: Every day | ORAL | Status: DC

## 2012-11-10 ENCOUNTER — Other Ambulatory Visit: Payer: Self-pay | Admitting: *Deleted

## 2012-11-10 MED ORDER — CARVEDILOL 25 MG PO TABS: 25.0000 mg | ORAL_TABLET | Freq: Two times a day (BID) | ORAL | Status: DC

## 2012-11-13 ENCOUNTER — Ambulatory Visit (INDEPENDENT_AMBULATORY_CARE_PROVIDER_SITE_OTHER): Payer: PRIVATE HEALTH INSURANCE | Admitting: *Deleted

## 2012-11-13 DIAGNOSIS — I4891 Unspecified atrial fibrillation: Secondary | ICD-10-CM

## 2012-11-15 ENCOUNTER — Other Ambulatory Visit: Payer: Self-pay | Admitting: Internal Medicine

## 2012-12-03 ENCOUNTER — Other Ambulatory Visit: Payer: Self-pay

## 2012-12-15 ENCOUNTER — Encounter: Payer: Self-pay | Admitting: Internal Medicine

## 2012-12-15 ENCOUNTER — Ambulatory Visit (INDEPENDENT_AMBULATORY_CARE_PROVIDER_SITE_OTHER): Payer: PRIVATE HEALTH INSURANCE | Admitting: *Deleted

## 2012-12-15 DIAGNOSIS — Z950 Presence of cardiac pacemaker: Secondary | ICD-10-CM

## 2012-12-15 DIAGNOSIS — I495 Sick sinus syndrome: Secondary | ICD-10-CM

## 2012-12-16 LAB — REMOTE PACEMAKER DEVICE
DEVICE MODEL PM: 2276567
VENTRICULAR PACING PM: 38

## 2012-12-19 ENCOUNTER — Encounter: Payer: Self-pay | Admitting: *Deleted

## 2012-12-25 ENCOUNTER — Ambulatory Visit (INDEPENDENT_AMBULATORY_CARE_PROVIDER_SITE_OTHER): Payer: PRIVATE HEALTH INSURANCE

## 2012-12-25 DIAGNOSIS — I4891 Unspecified atrial fibrillation: Secondary | ICD-10-CM

## 2013-01-15 ENCOUNTER — Ambulatory Visit (INDEPENDENT_AMBULATORY_CARE_PROVIDER_SITE_OTHER): Payer: PRIVATE HEALTH INSURANCE

## 2013-01-15 DIAGNOSIS — Z23 Encounter for immunization: Secondary | ICD-10-CM

## 2013-01-20 IMAGING — CR DG CHEST 1V PORT
1 series · 1 of 1 positions shown · non-contrast
Comparison: 05/25/2010

CLINICAL DATA: Central line placement.  GI bleed.

PORTABLE CHEST - 1 VIEW

[AP]
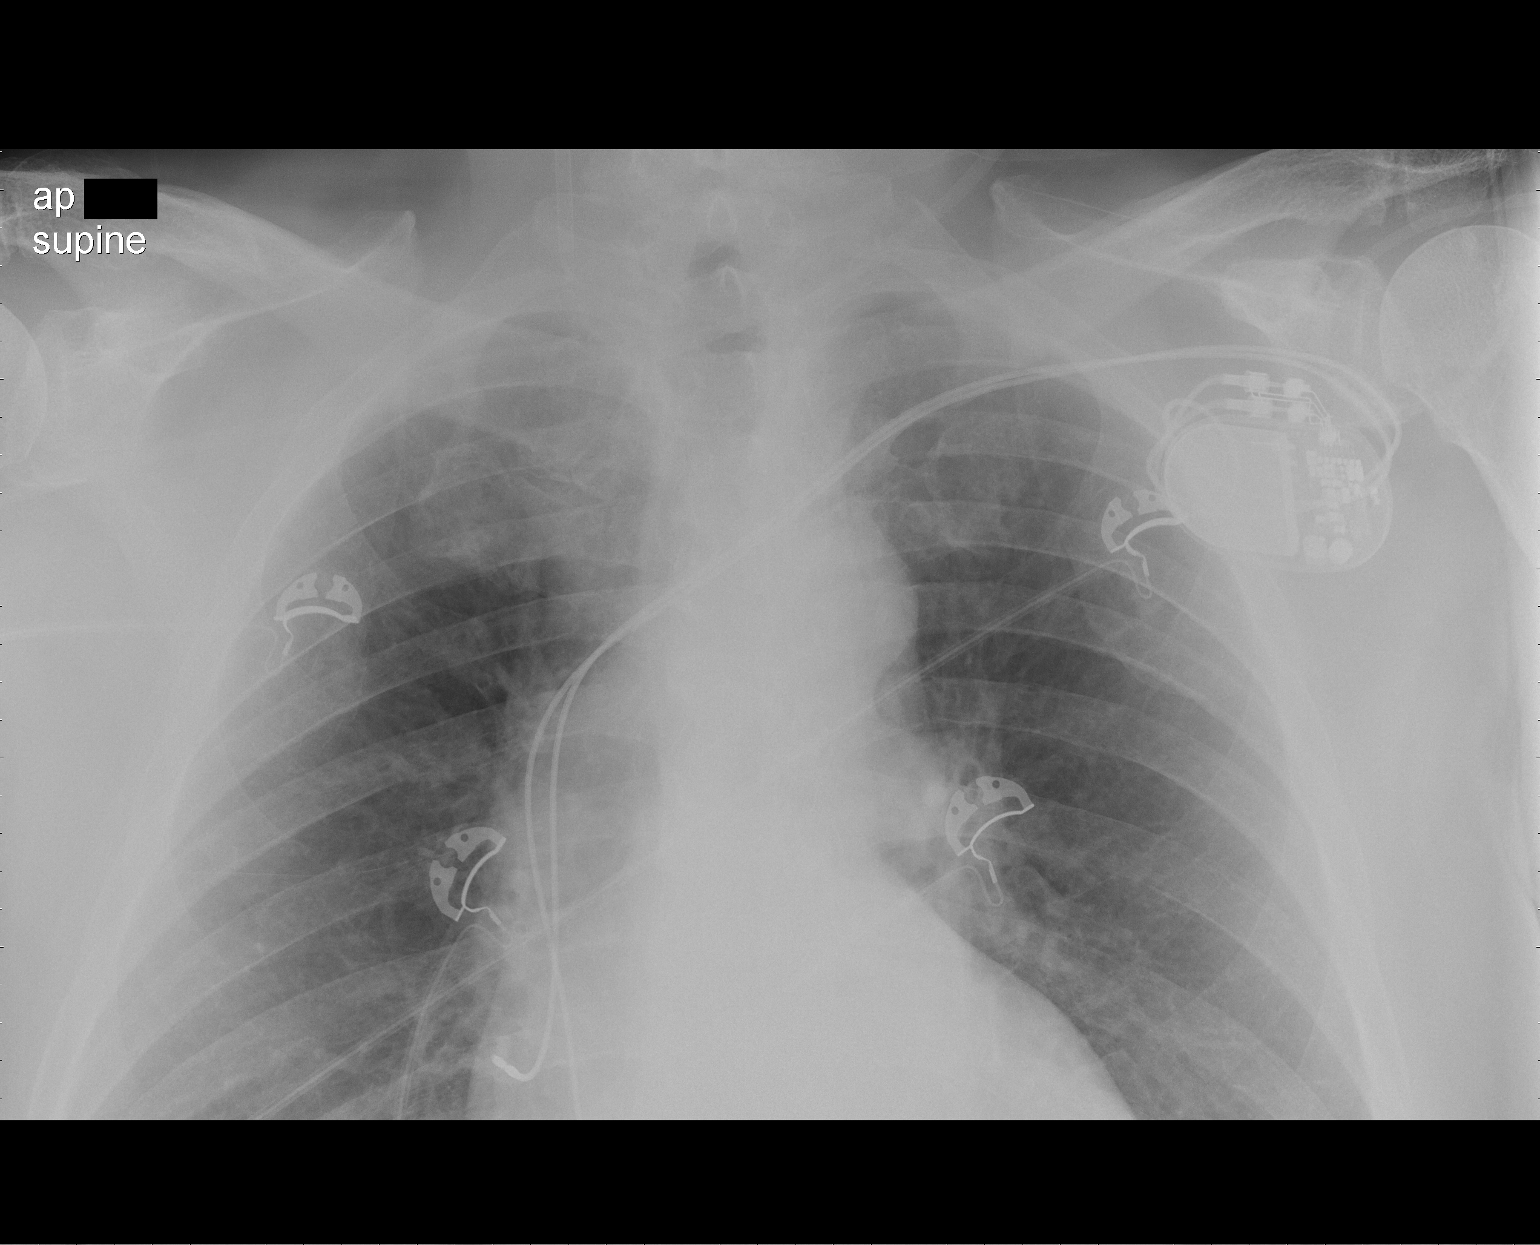

[1 of 1 positions shown; findings below may reference images not displayed]

FINDINGS: Upper limits normal heart size noted.
Lungs are clear.
A left-sided pacemaker again noted.
A catheter sheath is noted overlying the right neck with tip above
the clavicle.
There is no evidence of pneumothorax.
IMPRESSION: Catheter sheath overlying the right neck which may be within the
jugular vein but correlate clinically.  No evidence of
pneumothorax.

No evidence of acute cardiopulmonary disease.

## 2013-01-22 ENCOUNTER — Ambulatory Visit (INDEPENDENT_AMBULATORY_CARE_PROVIDER_SITE_OTHER): Payer: PRIVATE HEALTH INSURANCE | Admitting: *Deleted

## 2013-01-22 DIAGNOSIS — I4891 Unspecified atrial fibrillation: Secondary | ICD-10-CM

## 2013-02-19 ENCOUNTER — Ambulatory Visit (INDEPENDENT_AMBULATORY_CARE_PROVIDER_SITE_OTHER): Payer: PRIVATE HEALTH INSURANCE | Admitting: General Practice

## 2013-02-19 DIAGNOSIS — I4891 Unspecified atrial fibrillation: Secondary | ICD-10-CM

## 2013-03-12 ENCOUNTER — Ambulatory Visit (INDEPENDENT_AMBULATORY_CARE_PROVIDER_SITE_OTHER): Payer: PRIVATE HEALTH INSURANCE | Admitting: *Deleted

## 2013-03-12 DIAGNOSIS — I4891 Unspecified atrial fibrillation: Secondary | ICD-10-CM

## 2013-03-12 LAB — POCT INR: INR: 2.8

## 2013-03-23 ENCOUNTER — Ambulatory Visit (INDEPENDENT_AMBULATORY_CARE_PROVIDER_SITE_OTHER): Payer: PRIVATE HEALTH INSURANCE | Admitting: *Deleted

## 2013-03-23 ENCOUNTER — Encounter: Payer: Self-pay | Admitting: Internal Medicine

## 2013-03-23 DIAGNOSIS — I495 Sick sinus syndrome: Secondary | ICD-10-CM

## 2013-03-23 DIAGNOSIS — I4891 Unspecified atrial fibrillation: Secondary | ICD-10-CM

## 2013-03-23 LAB — MDC_IDC_ENUM_SESS_TYPE_REMOTE
Battery Remaining Longevity: 120 mo
Date Time Interrogation Session: 20141124215845
Implantable Pulse Generator Model: 2110
Implantable Pulse Generator Serial Number: 2276567
Lead Channel Impedance Value: 660 Ohm
Lead Channel Pacing Threshold Amplitude: 1 V
Lead Channel Sensing Intrinsic Amplitude: 12 mV

## 2013-04-03 ENCOUNTER — Encounter: Payer: Self-pay | Admitting: *Deleted

## 2013-04-09 ENCOUNTER — Ambulatory Visit (INDEPENDENT_AMBULATORY_CARE_PROVIDER_SITE_OTHER): Payer: PRIVATE HEALTH INSURANCE | Admitting: General Practice

## 2013-04-09 DIAGNOSIS — I4891 Unspecified atrial fibrillation: Secondary | ICD-10-CM

## 2013-04-09 LAB — POCT INR: INR: 2.8

## 2013-04-17 ENCOUNTER — Other Ambulatory Visit: Payer: Self-pay | Admitting: *Deleted

## 2013-04-17 MED ORDER — POTASSIUM CHLORIDE CRYS ER 20 MEQ PO TBCR: 20.0000 meq | EXTENDED_RELEASE_TABLET | Freq: Every day | ORAL | Status: DC

## 2013-05-08 ENCOUNTER — Other Ambulatory Visit: Payer: Self-pay | Admitting: Internal Medicine

## 2013-05-12 ENCOUNTER — Ambulatory Visit (INDEPENDENT_AMBULATORY_CARE_PROVIDER_SITE_OTHER): Payer: PRIVATE HEALTH INSURANCE

## 2013-05-12 DIAGNOSIS — I4891 Unspecified atrial fibrillation: Secondary | ICD-10-CM

## 2013-05-12 LAB — POCT INR: INR: 2.5

## 2013-06-05 ENCOUNTER — Ambulatory Visit (INDEPENDENT_AMBULATORY_CARE_PROVIDER_SITE_OTHER): Payer: PRIVATE HEALTH INSURANCE | Admitting: Family Medicine

## 2013-06-05 ENCOUNTER — Encounter: Payer: Self-pay | Admitting: Family Medicine

## 2013-06-05 VITALS — BP 128/80 | HR 75 | Temp 97.8°F | Ht 68.0 in | Wt 200.0 lb

## 2013-06-05 DIAGNOSIS — J209 Acute bronchitis, unspecified: Secondary | ICD-10-CM

## 2013-06-05 MED ORDER — AZITHROMYCIN 250 MG PO TABS: ORAL_TABLET | ORAL | Status: DC

## 2013-06-05 NOTE — Progress Notes (Signed)
Pre visit review using our clinic review tool, if applicable. No additional management support is needed unless otherwise documented below in the visit note. 

## 2013-06-05 NOTE — Progress Notes (Signed)
   Subjective:    Patient ID: Andrew Vance, male    DOB: Jul 19, 1935, 78 y.o.   MRN: [id number]  HPI Here for 3 weeks of chest tightness and a dry cough. No fever.    Review of Systems  Constitutional: Negative.   HENT: Positive for congestion. Negative for postnasal drip and sinus pressure.   Eyes: Negative.   Respiratory: Positive for cough.   Cardiovascular: Negative.        Objective:   Physical Exam  Constitutional: He appears well-developed and well-nourished.  HENT:  Right Ear: External ear normal.  Left Ear: External ear normal.  Nose: Nose normal.  Mouth/Throat: Oropharynx is clear and moist.  Eyes: Conjunctivae are normal.  Pulmonary/Chest: Effort normal and breath sounds normal.  Lymphadenopathy:    He has no cervical adenopathy.          Assessment & Plan:  Add Mucinex

## 2013-06-21 ENCOUNTER — Other Ambulatory Visit: Payer: Self-pay | Admitting: Internal Medicine

## 2013-06-22 ENCOUNTER — Ambulatory Visit (INDEPENDENT_AMBULATORY_CARE_PROVIDER_SITE_OTHER): Payer: PRIVATE HEALTH INSURANCE | Admitting: Family Medicine

## 2013-06-22 DIAGNOSIS — Z2911 Encounter for prophylactic immunotherapy for respiratory syncytial virus (RSV): Secondary | ICD-10-CM

## 2013-06-22 DIAGNOSIS — Z23 Encounter for immunization: Secondary | ICD-10-CM

## 2013-06-23 ENCOUNTER — Ambulatory Visit (INDEPENDENT_AMBULATORY_CARE_PROVIDER_SITE_OTHER): Payer: PRIVATE HEALTH INSURANCE | Admitting: *Deleted

## 2013-06-23 DIAGNOSIS — Z5181 Encounter for therapeutic drug level monitoring: Secondary | ICD-10-CM

## 2013-06-23 DIAGNOSIS — I4891 Unspecified atrial fibrillation: Secondary | ICD-10-CM

## 2013-06-23 LAB — POCT INR: INR: 4.2

## 2013-06-24 ENCOUNTER — Ambulatory Visit (INDEPENDENT_AMBULATORY_CARE_PROVIDER_SITE_OTHER): Payer: PRIVATE HEALTH INSURANCE | Admitting: *Deleted

## 2013-06-24 DIAGNOSIS — I4891 Unspecified atrial fibrillation: Secondary | ICD-10-CM

## 2013-06-24 DIAGNOSIS — I495 Sick sinus syndrome: Secondary | ICD-10-CM

## 2013-06-29 ENCOUNTER — Encounter: Payer: Self-pay | Admitting: Family Medicine

## 2013-06-29 ENCOUNTER — Encounter: Payer: Self-pay | Admitting: Internal Medicine

## 2013-06-29 LAB — MDC_IDC_ENUM_SESS_TYPE_REMOTE
Battery Remaining Longevity: 134 mo
Battery Voltage: 2.98 V
Brady Statistic RV Percent Paced: 35 %
Date Time Interrogation Session: 20150225211627
Implantable Pulse Generator Serial Number: 2276567
Lead Channel Pacing Threshold Amplitude: 1 V
Lead Channel Pacing Threshold Pulse Width: 0.4 ms
Lead Channel Setting Pacing Amplitude: 2.5 V
Lead Channel Setting Pacing Pulse Width: 0.4 ms
Lead Channel Setting Sensing Sensitivity: 2 mV
MDC IDC MSMT LEADCHNL RV IMPEDANCE VALUE: 640 Ohm
MDC IDC MSMT LEADCHNL RV SENSING INTR AMPL: 12 mV

## 2013-07-07 ENCOUNTER — Ambulatory Visit (INDEPENDENT_AMBULATORY_CARE_PROVIDER_SITE_OTHER): Payer: PRIVATE HEALTH INSURANCE | Admitting: Pharmacist

## 2013-07-07 DIAGNOSIS — Z5181 Encounter for therapeutic drug level monitoring: Secondary | ICD-10-CM

## 2013-07-07 DIAGNOSIS — I4891 Unspecified atrial fibrillation: Secondary | ICD-10-CM

## 2013-07-07 LAB — POCT INR: INR: 3.3

## 2013-07-16 ENCOUNTER — Encounter: Payer: Self-pay | Admitting: *Deleted

## 2013-07-20 ENCOUNTER — Encounter: Payer: Self-pay | Admitting: *Deleted

## 2013-07-21 ENCOUNTER — Ambulatory Visit (INDEPENDENT_AMBULATORY_CARE_PROVIDER_SITE_OTHER): Payer: PRIVATE HEALTH INSURANCE

## 2013-07-21 DIAGNOSIS — I4891 Unspecified atrial fibrillation: Secondary | ICD-10-CM

## 2013-07-21 DIAGNOSIS — Z5181 Encounter for therapeutic drug level monitoring: Secondary | ICD-10-CM

## 2013-07-21 LAB — POCT INR: INR: 4.2

## 2013-08-04 ENCOUNTER — Ambulatory Visit (INDEPENDENT_AMBULATORY_CARE_PROVIDER_SITE_OTHER): Payer: PRIVATE HEALTH INSURANCE | Admitting: Pharmacist Clinician (PhC)/ Clinical Pharmacy Specialist

## 2013-08-04 DIAGNOSIS — I4891 Unspecified atrial fibrillation: Secondary | ICD-10-CM

## 2013-08-04 DIAGNOSIS — Z5181 Encounter for therapeutic drug level monitoring: Secondary | ICD-10-CM

## 2013-08-04 LAB — POCT INR: INR: 3.3

## 2013-08-12 ENCOUNTER — Other Ambulatory Visit (INDEPENDENT_AMBULATORY_CARE_PROVIDER_SITE_OTHER): Payer: PRIVATE HEALTH INSURANCE

## 2013-08-12 DIAGNOSIS — Z0000 Encounter for general adult medical examination without abnormal findings: Secondary | ICD-10-CM

## 2013-08-12 LAB — POCT URINALYSIS DIPSTICK
Bilirubin, UA: NEGATIVE
GLUCOSE UA: NEGATIVE
Ketones, UA: NEGATIVE
Leukocytes, UA: NEGATIVE
NITRITE UA: NEGATIVE
Protein, UA: NEGATIVE
Spec Grav, UA: <=1.005
UROBILINOGEN UA: 0.2
pH, UA: 6

## 2013-08-12 LAB — CBC WITH DIFFERENTIAL/PLATELET
Basophils Absolute: 0 10*3/uL (ref 0.0–0.1)
Basophils Relative: 0.6 % (ref 0.0–3.0)
EOS ABS: 0.1 10*3/uL (ref 0.0–0.7)
Eosinophils Relative: 1.5 % (ref 0.0–5.0)
HEMATOCRIT: 45.5 % (ref 39.0–52.0)
Hemoglobin: 15.1 g/dL (ref 13.0–17.0)
Lymphocytes Relative: 13.3 % (ref 12.0–46.0)
Lymphs Abs: 0.9 10*3/uL (ref 0.7–4.0)
MCHC: 33.1 g/dL (ref 30.0–36.0)
MCV: 96.1 fl (ref 78.0–100.0)
MONO ABS: 0.5 10*3/uL (ref 0.1–1.0)
Monocytes Relative: 7.4 % (ref 3.0–12.0)
Neutro Abs: 5.1 10*3/uL (ref 1.4–7.7)
Neutrophils Relative %: 77.2 % — ABNORMAL HIGH (ref 43.0–77.0)
PLATELETS: 163 10*3/uL (ref 150.0–400.0)
RBC: 4.74 Mil/uL (ref 4.22–5.81)
RDW: 14.9 % — AB (ref 11.5–14.6)
WBC: 6.6 10*3/uL (ref 4.5–10.5)

## 2013-08-12 LAB — BASIC METABOLIC PANEL
BUN: 12 mg/dL (ref 6–23)
CO2: 25 mEq/L (ref 19–32)
Calcium: 9.4 mg/dL (ref 8.4–10.5)
Chloride: 106 mEq/L (ref 96–112)
Creatinine, Ser: 0.9 mg/dL (ref 0.4–1.5)
GFR: 89.6 mL/min (ref >60.00)
GLUCOSE: 79 mg/dL (ref 70–99)
Potassium: 4.3 mEq/L (ref 3.5–5.1)
Sodium: 139 mEq/L (ref 135–145)

## 2013-08-12 LAB — HEPATIC FUNCTION PANEL
ALT: 16 U/L (ref 0–53)
AST: 24 U/L (ref 0–37)
Albumin: 3.9 g/dL (ref 3.5–5.2)
Alkaline Phosphatase: 74 U/L (ref 39–117)
BILIRUBIN TOTAL: 1.7 mg/dL — AB (ref 0.3–1.2)
Bilirubin, Direct: 0.3 mg/dL (ref 0.0–0.3)
Total Protein: 6.8 g/dL (ref 6.0–8.3)

## 2013-08-12 LAB — LIPID PANEL
CHOL/HDL RATIO: 3
CHOLESTEROL: 113 mg/dL (ref 0–200)
HDL: 40.7 mg/dL (ref >39.00)
LDL Cholesterol: 58 mg/dL (ref 0–99)
TRIGLYCERIDES: 71 mg/dL (ref 0.0–149.0)
VLDL: 14.2 mg/dL (ref 0.0–40.0)

## 2013-08-12 LAB — PSA: PSA: 2.34 ng/mL (ref 0.10–4.00)

## 2013-08-12 LAB — TSH: TSH: 1.53 u[IU]/mL (ref 0.35–5.50)

## 2013-08-18 ENCOUNTER — Ambulatory Visit (INDEPENDENT_AMBULATORY_CARE_PROVIDER_SITE_OTHER): Payer: PRIVATE HEALTH INSURANCE | Admitting: *Deleted

## 2013-08-18 ENCOUNTER — Encounter: Payer: Self-pay | Admitting: Family Medicine

## 2013-08-18 ENCOUNTER — Ambulatory Visit (INDEPENDENT_AMBULATORY_CARE_PROVIDER_SITE_OTHER): Payer: PRIVATE HEALTH INSURANCE | Admitting: Family Medicine

## 2013-08-18 VITALS — BP 126/78 | HR 71 | Temp 98.5°F | Ht 68.0 in | Wt 199.0 lb

## 2013-08-18 DIAGNOSIS — I4891 Unspecified atrial fibrillation: Secondary | ICD-10-CM

## 2013-08-18 DIAGNOSIS — Z5181 Encounter for therapeutic drug level monitoring: Secondary | ICD-10-CM

## 2013-08-18 DIAGNOSIS — Z0000 Encounter for general adult medical examination without abnormal findings: Secondary | ICD-10-CM

## 2013-08-18 LAB — POCT INR: INR: 3.6

## 2013-08-18 NOTE — Progress Notes (Signed)
Pre visit review using our clinic review tool, if applicable. No additional management support is needed unless otherwise documented below in the visit note. 

## 2013-08-18 NOTE — Progress Notes (Signed)
   Subjective:    Patient ID: Andrew Vance, male    DOB: 1935-10-19, 78 y.o.   MRN: [id number]  HPI 78 yr old male for a cpx. He feels well other than his joint pains.    Review of Systems  Constitutional: Negative.   HENT: Negative.   Eyes: Negative.   Respiratory: Negative.   Cardiovascular: Negative.   Gastrointestinal: Negative.   Genitourinary: Negative.   Musculoskeletal: Negative.   Skin: Negative.   Neurological: Negative.   Psychiatric/Behavioral: Negative.        Objective:   Physical Exam  Constitutional: He is oriented to person, place, and time. He appears well-developed and well-nourished. No distress.  HENT:  Head: Normocephalic and atraumatic.  Right Ear: External ear normal.  Left Ear: External ear normal.  Nose: Nose normal.  Mouth/Throat: Oropharynx is clear and moist. No oropharyngeal exudate.  Eyes: Conjunctivae and EOM are normal. Pupils are equal, round, and reactive to light. Right eye exhibits no discharge. Left eye exhibits no discharge. No scleral icterus.  Neck: Neck supple. No JVD present. No tracheal deviation present. No thyromegaly present.  Cardiovascular: Normal rate, normal heart sounds and intact distal pulses.  Exam reveals no gallop and no friction rub.   No murmur heard. Occasional ectopy on exam   Pulmonary/Chest: Effort normal and breath sounds normal. No respiratory distress. He has no wheezes. He has no rales. He exhibits no tenderness.  Abdominal: Soft. Bowel sounds are normal. He exhibits no distension and no mass. There is no tenderness. There is no rebound and no guarding.  Genitourinary: Rectum normal, prostate normal and penis normal. Guaiac negative stool. No penile tenderness.  Musculoskeletal: Normal range of motion. He exhibits no edema and no tenderness.  Lymphadenopathy:    He has no cervical adenopathy.  Neurological: He is alert and oriented to person, place, and time. He has normal reflexes. No cranial nerve  deficit. He exhibits normal muscle tone. Coordination normal.  Skin: Skin is warm and dry. No rash noted. He is not diaphoretic. No erythema. No pallor.  Psychiatric: He has a normal mood and affect. His behavior is normal. Judgment and thought content normal.          Assessment & Plan:  Well exam.

## 2013-08-28 ENCOUNTER — Other Ambulatory Visit: Payer: Self-pay | Admitting: Internal Medicine

## 2013-09-02 ENCOUNTER — Ambulatory Visit (INDEPENDENT_AMBULATORY_CARE_PROVIDER_SITE_OTHER): Payer: Medicare HMO | Admitting: *Deleted

## 2013-09-02 DIAGNOSIS — Z5181 Encounter for therapeutic drug level monitoring: Secondary | ICD-10-CM

## 2013-09-02 DIAGNOSIS — I4891 Unspecified atrial fibrillation: Secondary | ICD-10-CM

## 2013-09-02 LAB — POCT INR: INR: 2.1

## 2013-09-15 ENCOUNTER — Ambulatory Visit (INDEPENDENT_AMBULATORY_CARE_PROVIDER_SITE_OTHER): Payer: Medicare HMO | Admitting: Family Medicine

## 2013-09-15 ENCOUNTER — Telehealth: Payer: Self-pay | Admitting: Family Medicine

## 2013-09-15 ENCOUNTER — Encounter: Payer: Self-pay | Admitting: Family Medicine

## 2013-09-15 VITALS — BP 136/103 | HR 87 | Temp 98.1°F | Ht 68.0 in | Wt 198.0 lb

## 2013-09-15 DIAGNOSIS — I428 Other cardiomyopathies: Secondary | ICD-10-CM

## 2013-09-15 DIAGNOSIS — I429 Cardiomyopathy, unspecified: Secondary | ICD-10-CM

## 2013-09-15 DIAGNOSIS — I4891 Unspecified atrial fibrillation: Secondary | ICD-10-CM

## 2013-09-15 DIAGNOSIS — R0602 Shortness of breath: Secondary | ICD-10-CM

## 2013-09-15 DIAGNOSIS — I10 Essential (primary) hypertension: Secondary | ICD-10-CM

## 2013-09-15 MED ORDER — FUROSEMIDE 40 MG PO TABS: 40.0000 mg | ORAL_TABLET | Freq: Every day | ORAL | Status: DC

## 2013-09-15 NOTE — Progress Notes (Signed)
Pre visit review using our clinic review tool, if applicable. No additional management support is needed unless otherwise documented below in the visit note. 

## 2013-09-15 NOTE — Telephone Encounter (Signed)
Relevant patient education assigned to patient using Emmi. ° °

## 2013-09-15 NOTE — Progress Notes (Signed)
   Subjective:    Patient ID: Andrew Vance, male    DOB: [birth year], 78 y.o.   MRN: [id number]  HPI Here for 3 days of occasional SOB. No chest pain or pressure, no rapid heart beats or palpitations. He tends to have swelling in the feet and lower legs. He was here last month for a cpx and he was doing well. His labs at that time were all normal.    Review of Systems  Constitutional: Negative.   Respiratory: Positive for shortness of breath. Negative for cough, choking, chest tightness and wheezing.   Cardiovascular: Positive for leg swelling. Negative for chest pain and palpitations.       Objective:   Physical Exam  Constitutional: He appears well-developed and well-nourished.  Cardiovascular: Normal rate and intact distal pulses.  Exam reveals no gallop and no friction rub.   Irregularly irregular rhythm. His EKG shows atrial fib with some LVH  Musculoskeletal:  2+ edema in the ankles           Assessment & Plan:  He is retaining some fluid, possibly showing some mild CHF. Add Lasix 40 mg daily. He is to see Dr. Lovena Le in Cardiology on 09-29-13.

## 2013-09-16 ENCOUNTER — Encounter: Payer: Self-pay | Admitting: Family Medicine

## 2013-09-24 ENCOUNTER — Other Ambulatory Visit: Payer: Self-pay | Admitting: Internal Medicine

## 2013-09-25 ENCOUNTER — Other Ambulatory Visit: Payer: Self-pay

## 2013-09-25 MED ORDER — POTASSIUM CHLORIDE CRYS ER 20 MEQ PO TBCR: 20.0000 meq | EXTENDED_RELEASE_TABLET | Freq: Every day | ORAL | Status: DC

## 2013-09-29 ENCOUNTER — Ambulatory Visit (INDEPENDENT_AMBULATORY_CARE_PROVIDER_SITE_OTHER): Payer: Medicare HMO | Admitting: Internal Medicine

## 2013-09-29 ENCOUNTER — Encounter: Payer: Self-pay | Admitting: Internal Medicine

## 2013-09-29 ENCOUNTER — Ambulatory Visit (INDEPENDENT_AMBULATORY_CARE_PROVIDER_SITE_OTHER): Payer: Medicare HMO | Admitting: Pharmacist Clinician (PhC)/ Clinical Pharmacy Specialist

## 2013-09-29 VITALS — BP 150/88 | HR 84 | Ht 68.0 in | Wt 194.0 lb

## 2013-09-29 DIAGNOSIS — I4891 Unspecified atrial fibrillation: Secondary | ICD-10-CM

## 2013-09-29 DIAGNOSIS — I428 Other cardiomyopathies: Secondary | ICD-10-CM

## 2013-09-29 DIAGNOSIS — R0602 Shortness of breath: Secondary | ICD-10-CM

## 2013-09-29 DIAGNOSIS — Z5181 Encounter for therapeutic drug level monitoring: Secondary | ICD-10-CM

## 2013-09-29 DIAGNOSIS — I429 Cardiomyopathy, unspecified: Secondary | ICD-10-CM

## 2013-09-29 DIAGNOSIS — I5022 Chronic systolic (congestive) heart failure: Secondary | ICD-10-CM

## 2013-09-29 LAB — POCT INR: INR: 2

## 2013-09-29 LAB — MDC_IDC_ENUM_SESS_TYPE_INCLINIC
Battery Remaining Longevity: 147.6 mo
Battery Voltage: 2.98 V
Brady Statistic RA Percent Paced: 0 %
Implantable Pulse Generator Model: 2110
Implantable Pulse Generator Serial Number: 2276567
Lead Channel Pacing Threshold Amplitude: 1 V
Lead Channel Pacing Threshold Pulse Width: 0.4 ms
Lead Channel Setting Pacing Amplitude: 2.5 V
Lead Channel Setting Pacing Pulse Width: 0.4 ms
Lead Channel Setting Sensing Sensitivity: 2 mV
MDC IDC MSMT LEADCHNL RA SENSING INTR AMPL: 3.3 mV
MDC IDC MSMT LEADCHNL RV IMPEDANCE VALUE: 612.5 Ohm
MDC IDC MSMT LEADCHNL RV SENSING INTR AMPL: 12 mV
MDC IDC SESS DTM: 20150602144851
MDC IDC STAT BRADY RV PERCENT PACED: 34 %

## 2013-09-29 NOTE — Assessment & Plan Note (Signed)
It is unclear as to whether he has systolic or diastolic dysfunction. He has done well with lasix. Will check 2 D echo.

## 2013-09-29 NOTE — Patient Instructions (Signed)
Your physician wants you to follow-up in: 12 months with Dr Knox Saliva will receive a reminder letter in the mail two months in advance. If you don't receive a letter, please call our office to schedule the follow-up appointment.   Remote monitoring is used to monitor your Pacemaker of ICD from home. This monitoring reduces the number of office visits required to check your device to one time per year. It allows [place] to keep an eye on the functioning of your device to ensure it is working properly. You are scheduled for a device check from home on 12/31/13. You may send your transmission at any time that day. If you have a wireless device, the transmission will be sent automatically. After your physician reviews your transmission, you will receive a postcard with your next transmission date.  Your physician has requested that you have an echocardiogram. Echocardiography is a painless test that uses sound waves to create images of your heart. It provides your doctor with information about the size and shape of your heart and how well your heart's chambers and valves are working. This procedure takes approximately one hour. There are no restrictions for this procedure.

## 2013-09-29 NOTE — Progress Notes (Signed)
HPI [name] returns today for followup. He is a very pleasant 78 year old man with symptomatic tachycardia bradycardia syndrome, status post permanent pacemaker insertion. The patient has chronic atrial fibrillation. He also has hypertension and very severe and debilitating psoriatic arthritis. He is status post 3 hip replacements. He was in his usual state of health until a month ago when he got sob. He was given lasix and his symptoms improved. He denies dietary indiscretion of non-compliance. His activity is somewhat limited by arthritis. Allergies  Allergen Reactions  . Zithromax [Azithromycin]     Affects Coumadin levels      Current Outpatient Prescriptions  Medication Sig Dispense Refill  . calcipotriene (DOVONOX) 0.005 % cream Apply 1 application topically 2 (two) times daily as needed. rash      . carvedilol (COREG) 25 MG tablet TAKE 1 TABLET (25 MG TOTAL) BY MOUTH 2 (TWO) TIMES DAILY WITH A MEAL.  60 tablet  0  . CRESTOR 10 MG tablet TAKE ONE (1) TABLET EACH DAY  90 tablet  1  . fluocinolone (SYNALAR) 0.01 % external solution Apply topically 2 (two) times daily.  60 mL  11  . furosemide (LASIX) 40 MG tablet Take 1 tablet (40 mg total) by mouth daily.  30 tablet  11  . latanoprost (XALATAN) 0.005 % ophthalmic solution Place 1 drop into both eyes at bedtime.      Marland Kitchen lisinopril (PRINIVIL,ZESTRIL) 10 MG tablet Take 1 tablet (10 mg total) by mouth daily.  90 tablet  3  . Lutein 20 MG CAPS Take 1 capsule by mouth at bedtime.       . naproxen sodium (ANAPROX) 220 MG tablet Take 220 mg by mouth as needed.      . potassium chloride SA (K-DUR,KLOR-CON) 20 MEQ tablet Take 1 tablet (20 mEq total) by mouth daily.  30 tablet  3  . warfarin (COUMADIN) 5 MG tablet 1.5 tablets each Mon and Wed and 1 tablet all other days or as directed by coumadin clinic       No current facility-administered medications for this visit.     Past Medical History  Diagnosis Date  . Psoriatic arthritis   . GERD  (gastroesophageal reflux disease)   . Hypertension   . Atrial fibrillation     sees Dr. Virl Axe  . Tachycardia-bradycardia syndrome   . GI bleed     hx  . Hyperlipidemia   . Pacemaker   . H/O hiatal hernia   . Arthritis   . Anemia, iron deficiency     hx  . GI bleed     hx  . Complication of anesthesia     BP elevated with spinal     ROS:   All systems reviewed and negative except as noted in the HPI.   Past Surgical History  Procedure Laterality Date  . Total hip arthroplasty  1998 and 2001    bilateral, sees Dr. Para March   . Appendectomy    . Hand surgery  1978    lft hand, fusions to DIPs and PIPs of fingers 1,2,4,and 5  . Colonoscopy  05-27-10    per Dr, Ardis Hughs, extensive diverticulosis, repeat in 5 yrs   . Esophagogastroduodenoscopy  05-27-10    per Dr. Ardis Hughs, normal   . Cardiac catheterization  09/10/08  . Pilonidal cyst excision  61  . Total hip arthroplasty  02/25/2012    left hip  . Insert / replace / remove pacemaker  2010  . Total hip  revision  02/25/2012    Procedure: TOTAL HIP REVISION;  Surgeon: Kerin Salen, MD;  Location: [facility];  Service: Orthopedics;  Laterality: Left;     Family History  Problem Relation Age of Onset  . Hypertension Other   . Heart disease Other      History   Social History  . Marital Status: Married    Spouse Name: N/A    Number of Children: N/A  . Years of Education: N/A   Occupational History  . Not on file.   Social History Main Topics  . Smoking status: Former Smoker -- 1.00 packs/day for 30 years    Types: Cigarettes, Pipe    Quit date: 02/17/1982  . Smokeless tobacco: Former Systems developer    Quit date: 05/01/1983  . Alcohol Use: 0.5 oz/week    1 drink(s) per week     Comment: occ  . Drug Use: No  . Sexual Activity: Not on file   Other Topics Concern  . Not on file   Social History Narrative  . No narrative on file     BP 150/88  Pulse 84  Ht 5\' 8"  (1.727 m)  Wt 194 lb (87.998 kg)  BMI 29.50  kg/m2  Physical Exam:  Well appearing elderly man,NAD HEENT: Unremarkable Neck:  6 cm JVD, no thyromegally Back:  No CVA tenderness Lungs:  Clear with no wheezes, rales, or rhonchi. HEART:  IRegular rate rhythm, no murmurs, no rubs, no clicks Abd:  soft, positive bowel sounds, no organomegally, no rebound, no guarding Ext:  2 plus pulses, no edema, no cyanosis, no clubbing Skin:  No rashes no nodules Neuro:  CN II through XII intact, motor grossly intact  DEVICE  Normal device function.  See PaceArt for details.   Assess/Plan:

## 2013-09-29 NOTE — Assessment & Plan Note (Signed)
His ventricular rates are well controlled. He will followup in several months. No change in meds.

## 2013-10-14 ENCOUNTER — Ambulatory Visit (HOSPITAL_COMMUNITY): Payer: Medicare HMO | Attending: Cardiology | Admitting: Radiology

## 2013-10-14 DIAGNOSIS — I428 Other cardiomyopathies: Secondary | ICD-10-CM | POA: Insufficient documentation

## 2013-10-14 DIAGNOSIS — I10 Essential (primary) hypertension: Secondary | ICD-10-CM | POA: Insufficient documentation

## 2013-10-14 DIAGNOSIS — R0602 Shortness of breath: Secondary | ICD-10-CM | POA: Insufficient documentation

## 2013-10-14 DIAGNOSIS — I509 Heart failure, unspecified: Secondary | ICD-10-CM | POA: Insufficient documentation

## 2013-10-14 DIAGNOSIS — I4891 Unspecified atrial fibrillation: Secondary | ICD-10-CM | POA: Insufficient documentation

## 2013-10-14 DIAGNOSIS — I059 Rheumatic mitral valve disease, unspecified: Secondary | ICD-10-CM | POA: Insufficient documentation

## 2013-10-14 NOTE — Progress Notes (Signed)
Echocardiogram performed.  

## 2013-10-27 ENCOUNTER — Ambulatory Visit (INDEPENDENT_AMBULATORY_CARE_PROVIDER_SITE_OTHER): Payer: Medicare HMO | Admitting: Pharmacist Clinician (PhC)/ Clinical Pharmacy Specialist

## 2013-10-27 DIAGNOSIS — I4891 Unspecified atrial fibrillation: Secondary | ICD-10-CM

## 2013-10-27 DIAGNOSIS — Z5181 Encounter for therapeutic drug level monitoring: Secondary | ICD-10-CM

## 2013-10-27 LAB — POCT INR: INR: 2.3

## 2013-11-04 ENCOUNTER — Encounter: Payer: Self-pay | Admitting: Internal Medicine

## 2013-11-04 ENCOUNTER — Ambulatory Visit (INDEPENDENT_AMBULATORY_CARE_PROVIDER_SITE_OTHER): Payer: Medicare HMO | Admitting: Internal Medicine

## 2013-11-04 VITALS — BP 120/82 | HR 85 | Ht 69.0 in | Wt 192.0 lb

## 2013-11-04 DIAGNOSIS — I5022 Chronic systolic (congestive) heart failure: Secondary | ICD-10-CM

## 2013-11-04 DIAGNOSIS — Z950 Presence of cardiac pacemaker: Secondary | ICD-10-CM

## 2013-11-04 DIAGNOSIS — R0989 Other specified symptoms and signs involving the circulatory and respiratory systems: Secondary | ICD-10-CM

## 2013-11-04 DIAGNOSIS — I495 Sick sinus syndrome: Secondary | ICD-10-CM

## 2013-11-04 MED ORDER — SPIRONOLACTONE 25 MG PO TABS: 25.0000 mg | ORAL_TABLET | Freq: Every day | ORAL | Status: DC

## 2013-11-04 NOTE — Assessment & Plan Note (Signed)
He previously had severe LV dysfunction which improved but has worsened again. I have asked the patient to start aldactone and stop potassium. Will plan to recheck his echo in 3-4 months and if EF still down, consider BiV ICD. His CHF is class 2-3.

## 2013-11-04 NOTE — Progress Notes (Signed)
HPI Andrew Vance returns today for followup. He is a pleasant 78 yo man with a h/o atrial fib, symptomatic bradycardia and LBBB. He developed worsening heart failure symptoms when I saw him several weeks ago and underwent 2D echo. His EF is 15-20%. He has been on a beta blocker and ACE inhibitor and with initiation of lasix his dyspnea has resolved. He denies chest pain. Minimal peripheral edema.   Allergies  Allergen Reactions  . Zithromax [Azithromycin]     Affects Coumadin levels      Current Outpatient Prescriptions  Medication Sig Dispense Refill  . amoxicillin (AMOXIL) 500 MG tablet Take 500 mg by mouth as needed (take before dental work.).      Marland Kitchen calcipotriene (DOVONOX) 0.005 % cream Apply 1 application topically 2 (two) times daily as needed. rash      . carvedilol (COREG) 25 MG tablet TAKE 1 TABLET (25 MG TOTAL) BY MOUTH 2 (TWO) TIMES DAILY WITH A MEAL.  60 tablet  0  . CRESTOR 10 MG tablet TAKE ONE (1) TABLET EACH DAY  90 tablet  1  . fluocinolone (SYNALAR) 0.01 % external solution Apply topically 2 (two) times daily.  60 mL  11  . furosemide (LASIX) 40 MG tablet Take 1 tablet (40 mg total) by mouth daily.  30 tablet  11  . latanoprost (XALATAN) 0.005 % ophthalmic solution Place 1 drop into both eyes at bedtime.      Marland Kitchen lisinopril (PRINIVIL,ZESTRIL) 10 MG tablet Take 1 tablet (10 mg total) by mouth daily.  90 tablet  3  . Lutein 20 MG CAPS Take 1 capsule by mouth at bedtime.       . naproxen sodium (ANAPROX) 220 MG tablet Take 220 mg by mouth as needed.      . potassium chloride SA (K-DUR,KLOR-CON) 20 MEQ tablet Take 1 tablet (20 mEq total) by mouth daily.  30 tablet  3  . warfarin (COUMADIN) 5 MG tablet 1.5 tablets each Mon and Wed and 1 tablet all other days or as directed by coumadin clinic       No current facility-administered medications for this visit.     Past Medical History  Diagnosis Date  . Psoriatic arthritis   . GERD (gastroesophageal reflux disease)   .  Hypertension   . Atrial fibrillation     sees Dr. Virl Axe  . Tachycardia-bradycardia syndrome   . GI bleed     hx  . Hyperlipidemia   . Pacemaker   . H/O hiatal hernia   . Arthritis   . Anemia, iron deficiency     hx  . GI bleed     hx  . Complication of anesthesia     BP elevated with spinal     ROS:   All systems reviewed and negative except as noted in the HPI.   Past Surgical History  Procedure Laterality Date  . Total hip arthroplasty  1998 and 2001    bilateral, sees Dr. Para March   . Appendectomy    . Hand surgery  1978    lft hand, fusions to DIPs and PIPs of fingers 1,2,4,and 5  . Colonoscopy  05-27-10    per Dr, Ardis Hughs, extensive diverticulosis, repeat in 5 yrs   . Esophagogastroduodenoscopy  05-27-10    per Dr. Ardis Hughs, normal   . Cardiac catheterization  09/10/08  . Pilonidal cyst excision  61  . Total hip arthroplasty  02/25/2012    left hip  .  Insert / replace / remove pacemaker  2010  . Total hip revision  02/25/2012    Procedure: TOTAL HIP REVISION;  Surgeon: Kerin Salen, MD;  Location: [facility];  Service: Orthopedics;  Laterality: Left;     Family History  Problem Relation Age of Onset  . Hypertension Other   . Heart disease Other      History   Social History  . Marital Status: Married    Spouse Name: N/A    Number of Children: N/A  . Years of Education: N/A   Occupational History  . Not on file.   Social History Main Topics  . Smoking status: Former Smoker -- 1.00 packs/day for 30 years    Types: Cigarettes, Pipe    Quit date: 02/17/1982  . Smokeless tobacco: Former Systems developer    Quit date: 05/01/1983  . Alcohol Use: 0.5 oz/week    1 drink(s) per week     Comment: occ  . Drug Use: No  . Sexual Activity: Not on file   Other Topics Concern  . Not on file   Social History Narrative  . No narrative on file     BP 120/82  Pulse 85  Ht 5\' 9"  (1.753 m)  Wt 192 lb (87.091 kg)  BMI 28.34 kg/m2  Physical Exam:  Well appearing  78 yo man, NAD HEENT: Unremarkable Neck:  7 cm JVD, no thyromegally Back:  No CVA tenderness Lungs:  Clear with basilar rales HEART:  Regular rate rhythm, no murmurs, no rubs, no clicks Abd:  soft, positive bowel sounds, no organomegally, no rebound, no guarding Ext:  2 plus pulses, no edema, no cyanosis, no clubbing Skin:  No rashes no nodules Neuro:  CN II through XII intact, motor grossly intact  EKG - atrial fib with LBBB  DEVICE  Normal device function.  See PaceArt for details.   Assess/Plan:

## 2013-11-04 NOTE — Assessment & Plan Note (Signed)
His St. Jude device is working normally. Will recheck in several months.  

## 2013-11-04 NOTE — Patient Instructions (Addendum)
Your physician has recommended you make the following change in your medication:  1) STOP Potassium 2) START Aldactone 25 mg  Daily  Your physician recommends that you return for lab work on: 11/24/13 for BMET  Your physician has requested that you have an echocardiogram in 4 months. Echocardiography is a painless test that uses sound waves to create images of your heart. It provides your doctor with information about the size and shape of your heart and how well your heart's chambers and valves are working. This procedure takes approximately one hour. There are no restrictions for this procedure.  Remote monitoring is used to monitor your Pacemaker of ICD from home. This monitoring reduces the number of office visits required to check your device to one time per year. It allows [place] to keep an eye on the functioning of your device to ensure it is working properly. You are scheduled for a device check from home on 02/08/14. You may send your transmission at any time that day. If you have a wireless device, the transmission will be sent automatically. After your physician reviews your transmission, you will receive a postcard with your next transmission date.  Your physician wants you to follow-up in: 1 year with Dr. Lovena Le.  You will receive a reminder letter in the mail two months in advance. If you don't receive a letter, please call our office to schedule the follow-up appointment.

## 2013-11-14 ENCOUNTER — Other Ambulatory Visit: Payer: Self-pay | Admitting: Internal Medicine

## 2013-11-15 ENCOUNTER — Other Ambulatory Visit: Payer: Self-pay | Admitting: Internal Medicine

## 2013-11-23 ENCOUNTER — Other Ambulatory Visit: Payer: Self-pay | Admitting: Family Medicine

## 2013-11-24 ENCOUNTER — Other Ambulatory Visit (INDEPENDENT_AMBULATORY_CARE_PROVIDER_SITE_OTHER): Payer: Medicare HMO

## 2013-11-24 ENCOUNTER — Ambulatory Visit (INDEPENDENT_AMBULATORY_CARE_PROVIDER_SITE_OTHER): Payer: Medicare HMO | Admitting: Surgery

## 2013-11-24 DIAGNOSIS — I5022 Chronic systolic (congestive) heart failure: Secondary | ICD-10-CM

## 2013-11-24 DIAGNOSIS — Z5181 Encounter for therapeutic drug level monitoring: Secondary | ICD-10-CM

## 2013-11-24 DIAGNOSIS — I4891 Unspecified atrial fibrillation: Secondary | ICD-10-CM

## 2013-11-24 LAB — BASIC METABOLIC PANEL
BUN: 26 mg/dL — ABNORMAL HIGH (ref 6–23)
CALCIUM: 9.3 mg/dL (ref 8.4–10.5)
CO2: 29 mEq/L (ref 19–32)
Chloride: 105 mEq/L (ref 96–112)
Creatinine, Ser: 1.1 mg/dL (ref 0.4–1.5)
GFR: 70.69 mL/min (ref >60.00)
Glucose, Bld: 115 mg/dL — ABNORMAL HIGH (ref 70–99)
Potassium: 4.2 mEq/L (ref 3.5–5.1)
SODIUM: 139 meq/L (ref 135–145)

## 2013-11-24 LAB — POCT INR: INR: 2.4

## 2013-12-14 ENCOUNTER — Other Ambulatory Visit: Payer: Self-pay | Admitting: Internal Medicine

## 2013-12-31 ENCOUNTER — Other Ambulatory Visit: Payer: Self-pay | Admitting: Internal Medicine

## 2014-01-05 ENCOUNTER — Ambulatory Visit (INDEPENDENT_AMBULATORY_CARE_PROVIDER_SITE_OTHER): Payer: Medicare HMO

## 2014-01-05 ENCOUNTER — Ambulatory Visit (INDEPENDENT_AMBULATORY_CARE_PROVIDER_SITE_OTHER): Payer: Medicare HMO | Admitting: *Deleted

## 2014-01-05 DIAGNOSIS — Z23 Encounter for immunization: Secondary | ICD-10-CM

## 2014-01-05 DIAGNOSIS — I4891 Unspecified atrial fibrillation: Secondary | ICD-10-CM

## 2014-01-05 DIAGNOSIS — Z5181 Encounter for therapeutic drug level monitoring: Secondary | ICD-10-CM

## 2014-01-05 LAB — POCT INR: INR: 2.1

## 2014-01-07 ENCOUNTER — Other Ambulatory Visit: Payer: Self-pay | Admitting: Dermatology

## 2014-02-08 ENCOUNTER — Encounter: Payer: Medicare HMO | Admitting: *Deleted

## 2014-02-08 ENCOUNTER — Telehealth: Payer: Self-pay | Admitting: Cardiology

## 2014-02-08 NOTE — Telephone Encounter (Signed)
Spoke with pt and reminded pt of remote transmission that is due today. Pt verbalized understanding.   

## 2014-02-09 ENCOUNTER — Encounter: Payer: Self-pay | Admitting: Cardiology

## 2014-02-14 ENCOUNTER — Encounter: Payer: Self-pay | Admitting: Internal Medicine

## 2014-02-14 DIAGNOSIS — I495 Sick sinus syndrome: Secondary | ICD-10-CM

## 2014-02-15 ENCOUNTER — Ambulatory Visit (INDEPENDENT_AMBULATORY_CARE_PROVIDER_SITE_OTHER): Payer: Medicare HMO | Admitting: *Deleted

## 2014-02-15 DIAGNOSIS — I495 Sick sinus syndrome: Secondary | ICD-10-CM

## 2014-02-15 NOTE — Progress Notes (Signed)
Remote pacemaker transmission.   

## 2014-02-16 ENCOUNTER — Ambulatory Visit (INDEPENDENT_AMBULATORY_CARE_PROVIDER_SITE_OTHER): Payer: Medicare HMO | Admitting: *Deleted

## 2014-02-16 ENCOUNTER — Telehealth: Payer: Self-pay | Admitting: Internal Medicine

## 2014-02-16 DIAGNOSIS — I4891 Unspecified atrial fibrillation: Secondary | ICD-10-CM

## 2014-02-16 DIAGNOSIS — Z5181 Encounter for therapeutic drug level monitoring: Secondary | ICD-10-CM

## 2014-02-16 LAB — MDC_IDC_ENUM_SESS_TYPE_REMOTE
Date Time Interrogation Session: 20151018205912
Lead Channel Pacing Threshold Amplitude: 1 V
Lead Channel Sensing Intrinsic Amplitude: 12 mV
Lead Channel Setting Pacing Amplitude: 2.5 V
Lead Channel Setting Pacing Pulse Width: 0.4 ms
MDC IDC MSMT BATTERY REMAINING LONGEVITY: 134 mo
MDC IDC MSMT BATTERY REMAINING PERCENTAGE: 91 %
MDC IDC MSMT BATTERY VOLTAGE: 2.98 V
MDC IDC MSMT LEADCHNL RV IMPEDANCE VALUE: 700 Ohm
MDC IDC MSMT LEADCHNL RV PACING THRESHOLD PULSEWIDTH: 0.4 ms
MDC IDC PG SERIAL: 2276567
MDC IDC SET LEADCHNL RV SENSING SENSITIVITY: 2 mV
MDC IDC STAT BRADY RV PERCENT PACED: 34 %

## 2014-02-16 LAB — POCT INR: INR: 2.3

## 2014-02-16 NOTE — Telephone Encounter (Signed)
Informed pt that we did receive transmission on 02-14-14.

## 2014-02-16 NOTE — Telephone Encounter (Signed)
New message ° ° ° ° ° ° °Did we get his remote transmission? °

## 2014-02-20 ENCOUNTER — Other Ambulatory Visit: Payer: Self-pay | Admitting: Internal Medicine

## 2014-03-08 ENCOUNTER — Ambulatory Visit (HOSPITAL_COMMUNITY): Payer: Medicare HMO | Attending: Internal Medicine | Admitting: Radiology

## 2014-03-08 DIAGNOSIS — I10 Essential (primary) hypertension: Secondary | ICD-10-CM | POA: Diagnosis not present

## 2014-03-08 DIAGNOSIS — I4891 Unspecified atrial fibrillation: Secondary | ICD-10-CM | POA: Insufficient documentation

## 2014-03-08 DIAGNOSIS — I5022 Chronic systolic (congestive) heart failure: Secondary | ICD-10-CM | POA: Diagnosis present

## 2014-03-08 DIAGNOSIS — Z87891 Personal history of nicotine dependence: Secondary | ICD-10-CM | POA: Insufficient documentation

## 2014-03-08 DIAGNOSIS — R0989 Other specified symptoms and signs involving the circulatory and respiratory systems: Secondary | ICD-10-CM

## 2014-03-08 NOTE — Progress Notes (Signed)
Echocardiogram performed.  

## 2014-03-16 ENCOUNTER — Encounter: Payer: Self-pay | Admitting: Cardiology

## 2014-03-16 ENCOUNTER — Encounter: Payer: Self-pay | Admitting: Internal Medicine

## 2014-03-30 ENCOUNTER — Ambulatory Visit (INDEPENDENT_AMBULATORY_CARE_PROVIDER_SITE_OTHER): Payer: Medicare HMO

## 2014-03-30 DIAGNOSIS — Z5181 Encounter for therapeutic drug level monitoring: Secondary | ICD-10-CM

## 2014-03-30 DIAGNOSIS — I4891 Unspecified atrial fibrillation: Secondary | ICD-10-CM

## 2014-03-30 LAB — POCT INR: INR: 3

## 2014-05-11 ENCOUNTER — Ambulatory Visit (INDEPENDENT_AMBULATORY_CARE_PROVIDER_SITE_OTHER): Payer: PPO | Admitting: *Deleted

## 2014-05-11 DIAGNOSIS — I4891 Unspecified atrial fibrillation: Secondary | ICD-10-CM

## 2014-05-11 DIAGNOSIS — Z5181 Encounter for therapeutic drug level monitoring: Secondary | ICD-10-CM

## 2014-05-11 LAB — POCT INR: INR: 2.2

## 2014-05-16 ENCOUNTER — Other Ambulatory Visit: Payer: Self-pay | Admitting: Family Medicine

## 2014-05-24 ENCOUNTER — Ambulatory Visit (INDEPENDENT_AMBULATORY_CARE_PROVIDER_SITE_OTHER): Payer: PPO | Admitting: *Deleted

## 2014-05-24 ENCOUNTER — Telehealth: Payer: Self-pay | Admitting: Cardiology

## 2014-05-24 DIAGNOSIS — I495 Sick sinus syndrome: Secondary | ICD-10-CM

## 2014-05-24 LAB — MDC_IDC_ENUM_SESS_TYPE_REMOTE
Battery Remaining Longevity: 121 mo
Battery Remaining Percentage: 82 %
Date Time Interrogation Session: 20160125223548
Implantable Pulse Generator Model: 2110
Implantable Pulse Generator Serial Number: 2276567
Lead Channel Sensing Intrinsic Amplitude: 12 mV
Lead Channel Setting Pacing Amplitude: 2.5 V
Lead Channel Setting Pacing Pulse Width: 0.4 ms
Lead Channel Setting Sensing Sensitivity: 2 mV
MDC IDC MSMT BATTERY VOLTAGE: 2.96 V
MDC IDC MSMT LEADCHNL RV IMPEDANCE VALUE: 730 Ohm
MDC IDC STAT BRADY RV PERCENT PACED: 30 %

## 2014-05-24 NOTE — Telephone Encounter (Signed)
Spoke with pt and reminded pt of remote transmission that is due today. Pt verbalized understanding.   

## 2014-05-25 NOTE — Progress Notes (Signed)
Remote pacemaker transmission.   

## 2014-06-02 ENCOUNTER — Encounter: Payer: Self-pay | Admitting: Cardiology

## 2014-06-10 ENCOUNTER — Other Ambulatory Visit: Payer: Self-pay | Admitting: Internal Medicine

## 2014-06-11 ENCOUNTER — Other Ambulatory Visit: Payer: Self-pay

## 2014-06-11 MED ORDER — ROSUVASTATIN CALCIUM 10 MG PO TABS: 10.0000 mg | ORAL_TABLET | Freq: Every day | ORAL | Status: DC

## 2014-06-22 ENCOUNTER — Ambulatory Visit (INDEPENDENT_AMBULATORY_CARE_PROVIDER_SITE_OTHER): Payer: PPO | Admitting: *Deleted

## 2014-06-22 DIAGNOSIS — Z5181 Encounter for therapeutic drug level monitoring: Secondary | ICD-10-CM

## 2014-06-22 DIAGNOSIS — I4891 Unspecified atrial fibrillation: Secondary | ICD-10-CM

## 2014-06-22 LAB — POCT INR: INR: 2.1

## 2014-07-19 ENCOUNTER — Encounter: Payer: Self-pay | Admitting: Internal Medicine

## 2014-07-24 ENCOUNTER — Other Ambulatory Visit: Payer: Self-pay | Admitting: Family Medicine

## 2014-07-24 ENCOUNTER — Other Ambulatory Visit: Payer: Self-pay | Admitting: Internal Medicine

## 2014-07-27 ENCOUNTER — Encounter: Payer: Self-pay | Admitting: Family Medicine

## 2014-08-03 ENCOUNTER — Ambulatory Visit (INDEPENDENT_AMBULATORY_CARE_PROVIDER_SITE_OTHER): Payer: PPO

## 2014-08-03 DIAGNOSIS — Z5181 Encounter for therapeutic drug level monitoring: Secondary | ICD-10-CM | POA: Diagnosis not present

## 2014-08-03 DIAGNOSIS — I4891 Unspecified atrial fibrillation: Secondary | ICD-10-CM

## 2014-08-03 LAB — POCT INR: INR: 2.2

## 2014-08-13 ENCOUNTER — Other Ambulatory Visit: Payer: Self-pay | Admitting: Internal Medicine

## 2014-08-20 ENCOUNTER — Encounter: Payer: Self-pay | Admitting: Family Medicine

## 2014-08-20 ENCOUNTER — Ambulatory Visit (INDEPENDENT_AMBULATORY_CARE_PROVIDER_SITE_OTHER): Payer: PPO | Admitting: Family Medicine

## 2014-08-20 VITALS — BP 108/66 | HR 77 | Temp 98.6°F | Ht 69.0 in | Wt 195.0 lb

## 2014-08-20 DIAGNOSIS — Z125 Encounter for screening for malignant neoplasm of prostate: Secondary | ICD-10-CM

## 2014-08-20 DIAGNOSIS — N63 Unspecified lump in breast: Secondary | ICD-10-CM | POA: Diagnosis not present

## 2014-08-20 DIAGNOSIS — Z0000 Encounter for general adult medical examination without abnormal findings: Secondary | ICD-10-CM

## 2014-08-20 DIAGNOSIS — N6320 Unspecified lump in the left breast, unspecified quadrant: Secondary | ICD-10-CM

## 2014-08-20 DIAGNOSIS — Z0001 Encounter for general adult medical examination with abnormal findings: Secondary | ICD-10-CM | POA: Diagnosis not present

## 2014-08-20 LAB — CBC WITH DIFFERENTIAL/PLATELET
BASOS ABS: 0 10*3/uL (ref 0.0–0.1)
Basophils Relative: 0.4 % (ref 0.0–3.0)
EOS PCT: 2.8 % (ref 0.0–5.0)
Eosinophils Absolute: 0.2 10*3/uL (ref 0.0–0.7)
HCT: 41.4 % (ref 39.0–52.0)
Hemoglobin: 14 g/dL (ref 13.0–17.0)
LYMPHS PCT: 13.6 % (ref 12.0–46.0)
Lymphs Abs: 1.1 10*3/uL (ref 0.7–4.0)
MCHC: 33.8 g/dL (ref 30.0–36.0)
MCV: 96 fl (ref 78.0–100.0)
MONOS PCT: 9.5 % (ref 3.0–12.0)
Monocytes Absolute: 0.7 10*3/uL (ref 0.1–1.0)
Neutro Abs: 5.8 10*3/uL (ref 1.4–7.7)
Neutrophils Relative %: 73.7 % (ref 43.0–77.0)
Platelets: 164 10*3/uL (ref 150.0–400.0)
RBC: 4.31 Mil/uL (ref 4.22–5.81)
RDW: 13.6 % (ref 11.5–15.5)
WBC: 7.8 10*3/uL (ref 4.0–10.5)

## 2014-08-20 LAB — BASIC METABOLIC PANEL
BUN: 36 mg/dL — AB (ref 6–23)
CO2: 26 mEq/L (ref 19–32)
CREATININE: 1.11 mg/dL (ref 0.40–1.50)
Calcium: 9.7 mg/dL (ref 8.4–10.5)
Chloride: 105 mEq/L (ref 96–112)
GFR: 68.36 mL/min (ref >60.00)
Glucose, Bld: 91 mg/dL (ref 70–99)
Potassium: 4.9 mEq/L (ref 3.5–5.1)
Sodium: 138 mEq/L (ref 135–145)

## 2014-08-20 LAB — POCT URINALYSIS DIPSTICK
BILIRUBIN UA: NEGATIVE
Blood, UA: NEGATIVE
Glucose, UA: NEGATIVE
Ketones, UA: NEGATIVE
NITRITE UA: NEGATIVE
PH UA: 5.5
Protein, UA: NEGATIVE
Spec Grav, UA: 1.01
Urobilinogen, UA: 0.2

## 2014-08-20 LAB — LIPID PANEL
CHOLESTEROL: 123 mg/dL (ref 0–200)
HDL: 43.7 mg/dL (ref >39.00)
LDL CALC: 58 mg/dL (ref 0–99)
NonHDL: 79.3
TRIGLYCERIDES: 107 mg/dL (ref 0.0–149.0)
Total CHOL/HDL Ratio: 3
VLDL: 21.4 mg/dL (ref 0.0–40.0)

## 2014-08-20 LAB — HEPATIC FUNCTION PANEL
ALK PHOS: 80 U/L (ref 39–117)
ALT: 20 U/L (ref 0–53)
AST: 24 U/L (ref 0–37)
Albumin: 4.3 g/dL (ref 3.5–5.2)
BILIRUBIN TOTAL: 0.8 mg/dL (ref 0.2–1.2)
Bilirubin, Direct: 0.2 mg/dL (ref 0.0–0.3)
Total Protein: 7.4 g/dL (ref 6.0–8.3)

## 2014-08-20 LAB — PSA: PSA: 2.72 ng/mL (ref 0.10–4.00)

## 2014-08-20 LAB — TSH: TSH: 1.18 u[IU]/mL (ref 0.35–4.50)

## 2014-08-20 MED ORDER — CARVEDILOL 12.5 MG PO TABS: 12.5000 mg | ORAL_TABLET | Freq: Two times a day (BID) | ORAL | Status: DC

## 2014-08-20 NOTE — Progress Notes (Signed)
   Subjective:    Patient ID: Andrew Vance, male    DOB: [birth year], 79 y.o.   MRN: [id number]  HPI 79 yr old male for a cpx. He has a few questions today. First he has noticed both breasts have gotten a little larger in the past year, the left more so than the right. The left breast is also a little tender. He also mentions periods of lightheadedness, especially when he stands up from sitting. He has to hold onto something a few seconds and this passes. He checks his BP frequently during the day, and he gets a lot of low readings. His systolic pressure is often in the 80s or 90s. He sees Cardiology regularly.    Review of Systems  Constitutional: Negative.   HENT: Negative.   Eyes: Negative.   Respiratory: Negative.   Cardiovascular: Negative.   Gastrointestinal: Negative.   Genitourinary: Negative.   Musculoskeletal: Negative.   Skin: Negative.   Neurological: Positive for light-headedness. Negative for dizziness, tremors, seizures, syncope, facial asymmetry, speech difficulty, weakness, numbness and headaches.  Psychiatric/Behavioral: Negative.        Objective:   Physical Exam  Constitutional: He is oriented to person, place, and time. He appears well-developed and well-nourished. No distress.  HENT:  Head: Normocephalic and atraumatic.  Right Ear: External ear normal.  Left Ear: External ear normal.  Nose: Nose normal.  Mouth/Throat: Oropharynx is clear and moist. No oropharyngeal exudate.  Eyes: Conjunctivae and EOM are normal. Pupils are equal, round, and reactive to light. Right eye exhibits no discharge. Left eye exhibits no discharge. No scleral icterus.  Neck: Neck supple. No JVD present. No tracheal deviation present. No thyromegaly present.  Cardiovascular: Normal rate, normal heart sounds and intact distal pulses.  Exam reveals no gallop and no friction rub.   No murmur heard. Irregular rhythm   Pulmonary/Chest: Effort normal and breath sounds normal. No  respiratory distress. He has no wheezes. He has no rales. He exhibits no tenderness.  He has mild gynecomastia. The left breast has several areas of increased density which are tender, no discrete lumps   Abdominal: Soft. Bowel sounds are normal. He exhibits no distension and no mass. There is no tenderness. There is no rebound and no guarding.  Genitourinary: Rectum normal, prostate normal and penis normal. Guaiac negative stool. No penile tenderness.  Musculoskeletal: Normal range of motion. He exhibits no edema or tenderness.  Lymphadenopathy:    He has no cervical adenopathy.  Neurological: He is alert and oriented to person, place, and time. He has normal reflexes. No cranial nerve deficit. He exhibits normal muscle tone. Coordination normal.  Skin: Skin is warm and dry. No rash noted. He is not diaphoretic. No erythema. No pallor.  Psychiatric: He has a normal mood and affect. His behavior is normal. Judgment and thought content normal.          Assessment & Plan:  Well exam. Get fasting labs. He seems to have benign gynecomastia but we will set him up a mammogram and [place] soon. We will decrease the Carvedilol dose to 12.5 mg bid. He will see Dr. Lovena Le in about 4 weeks.

## 2014-08-20 NOTE — Progress Notes (Signed)
Pre visit review using our clinic review tool, if applicable. No additional management support is needed unless otherwise documented below in the visit note. 

## 2014-08-24 ENCOUNTER — Telehealth: Payer: Self-pay | Admitting: Cardiology

## 2014-08-24 ENCOUNTER — Encounter: Payer: Self-pay | Admitting: Internal Medicine

## 2014-08-24 ENCOUNTER — Ambulatory Visit (INDEPENDENT_AMBULATORY_CARE_PROVIDER_SITE_OTHER): Payer: PPO | Admitting: *Deleted

## 2014-08-24 ENCOUNTER — Ambulatory Visit (INDEPENDENT_AMBULATORY_CARE_PROVIDER_SITE_OTHER): Payer: PPO | Admitting: Family Medicine

## 2014-08-24 ENCOUNTER — Telehealth: Payer: Self-pay | Admitting: Family Medicine

## 2014-08-24 DIAGNOSIS — Z23 Encounter for immunization: Secondary | ICD-10-CM | POA: Diagnosis not present

## 2014-08-24 DIAGNOSIS — I495 Sick sinus syndrome: Secondary | ICD-10-CM

## 2014-08-24 NOTE — Telephone Encounter (Signed)
LMOVM reminding pt to send remote transmission.   

## 2014-08-24 NOTE — Telephone Encounter (Signed)
I spoke with Andrew Vance in referrals and she said that both orders are in computer for Andrew Vance [phone], pt can call and schedule this. I spoke with pt and gave this information.

## 2014-08-25 ENCOUNTER — Telehealth: Payer: Self-pay | Admitting: Cardiology

## 2014-08-25 DIAGNOSIS — I495 Sick sinus syndrome: Secondary | ICD-10-CM | POA: Diagnosis not present

## 2014-08-25 LAB — MDC_IDC_ENUM_SESS_TYPE_REMOTE
Battery Remaining Percentage: 82 %
Battery Voltage: 2.96 V
Brady Statistic RV Percent Paced: 27 %
Date Time Interrogation Session: 20160427205008
Implantable Pulse Generator Model: 2110
Implantable Pulse Generator Serial Number: 2276567
Lead Channel Impedance Value: 680 Ohm
Lead Channel Pacing Threshold Amplitude: 1 V
Lead Channel Pacing Threshold Pulse Width: 0.4 ms
Lead Channel Sensing Intrinsic Amplitude: 12 mV
Lead Channel Setting Pacing Amplitude: 2.5 V
Lead Channel Setting Pacing Pulse Width: 0.4 ms
Lead Channel Setting Sensing Sensitivity: 2 mV
MDC IDC MSMT BATTERY REMAINING LONGEVITY: 121 mo

## 2014-08-25 NOTE — Telephone Encounter (Signed)
Left message w/ pt wife for pt to return call.

## 2014-08-25 NOTE — Telephone Encounter (Signed)
Spoke w/ pt and he informed me that every time he has to send a transmission it takes him about 3-4 trys before it will send. I informed pt to call tech services and see if they can help him trouble shoot or order him a new monitor. Pt agreed.

## 2014-08-26 NOTE — Progress Notes (Signed)
Remote pacemaker transmission.   

## 2014-08-27 ENCOUNTER — Other Ambulatory Visit: Payer: Self-pay | Admitting: Internal Medicine

## 2014-08-30 ENCOUNTER — Ambulatory Visit
Admission: RE | Admit: 2014-08-30 | Discharge: 2014-08-30 | Disposition: A | Discharge status: Home / Self Care | Payer: PPO | Source: Ambulatory Visit | Attending: Family Medicine | Admitting: Family Medicine

## 2014-08-30 DIAGNOSIS — N6320 Unspecified lump in the left breast, unspecified quadrant: Secondary | ICD-10-CM

## 2014-09-09 ENCOUNTER — Encounter: Payer: Self-pay | Admitting: Cardiology

## 2014-09-14 ENCOUNTER — Ambulatory Visit (INDEPENDENT_AMBULATORY_CARE_PROVIDER_SITE_OTHER): Payer: PPO | Admitting: *Deleted

## 2014-09-14 DIAGNOSIS — I4891 Unspecified atrial fibrillation: Secondary | ICD-10-CM | POA: Diagnosis not present

## 2014-09-14 DIAGNOSIS — Z5181 Encounter for therapeutic drug level monitoring: Secondary | ICD-10-CM

## 2014-09-14 LAB — POCT INR: INR: 2.5

## 2014-09-16 ENCOUNTER — Encounter: Payer: Self-pay | Admitting: Internal Medicine

## 2014-10-07 ENCOUNTER — Ambulatory Visit (INDEPENDENT_AMBULATORY_CARE_PROVIDER_SITE_OTHER): Payer: PPO | Admitting: Internal Medicine

## 2014-10-07 ENCOUNTER — Encounter: Payer: Self-pay | Admitting: Internal Medicine

## 2014-10-07 VITALS — BP 130/68 | HR 77 | Ht 69.0 in | Wt 197.4 lb

## 2014-10-07 DIAGNOSIS — I495 Sick sinus syndrome: Secondary | ICD-10-CM | POA: Diagnosis not present

## 2014-10-07 DIAGNOSIS — I429 Cardiomyopathy, unspecified: Secondary | ICD-10-CM

## 2014-10-07 DIAGNOSIS — Z950 Presence of cardiac pacemaker: Secondary | ICD-10-CM

## 2014-10-07 DIAGNOSIS — I5022 Chronic systolic (congestive) heart failure: Secondary | ICD-10-CM

## 2014-10-07 DIAGNOSIS — R9430 Abnormal result of cardiovascular function study, unspecified: Secondary | ICD-10-CM

## 2014-10-07 DIAGNOSIS — I951 Orthostatic hypotension: Secondary | ICD-10-CM

## 2014-10-07 DIAGNOSIS — R0989 Other specified symptoms and signs involving the circulatory and respiratory systems: Secondary | ICD-10-CM

## 2014-10-07 DIAGNOSIS — I10 Essential (primary) hypertension: Secondary | ICD-10-CM

## 2014-10-07 LAB — CUP PACEART INCLINIC DEVICE CHECK
Battery Remaining Longevity: 130.8 mo
Battery Voltage: 2.95 V
Date Time Interrogation Session: 20160609095603
Lead Channel Impedance Value: 737.5 Ohm
Lead Channel Pacing Threshold Amplitude: 1 V
Lead Channel Pacing Threshold Pulse Width: 0.4 ms
Lead Channel Setting Sensing Sensitivity: 2 mV
MDC IDC MSMT LEADCHNL RV SENSING INTR AMPL: 12 mV
MDC IDC SET LEADCHNL RV PACING AMPLITUDE: 2.5 V
MDC IDC SET LEADCHNL RV PACING PULSEWIDTH: 0.4 ms
MDC IDC STAT BRADY RA PERCENT PACED: 0 %
MDC IDC STAT BRADY RV PERCENT PACED: 26 %
Pulse Gen Serial Number: 2276567

## 2014-10-07 MED ORDER — CARVEDILOL 12.5 MG PO TABS: 6.2500 mg | ORAL_TABLET | Freq: Two times a day (BID) | ORAL | Status: DC

## 2014-10-07 NOTE — Assessment & Plan Note (Signed)
His blood pressure is well controlled. Will follow. 

## 2014-10-07 NOTE — Assessment & Plan Note (Signed)
His symptoms are class 2. Mostly low output and we have asked him to reduce his dose of coreg as he has had dizziness. He will undergo repeat echo as he had gone from 20% to 30%. If his EF remains down, then upgrade to a BiV ICD would be indicated. I have discussed the risks/benefits of the procedure with the patient and he wishes to proceed.

## 2014-10-07 NOTE — Assessment & Plan Note (Signed)
I have asked the patient to reduce his dose or coreg.

## 2014-10-07 NOTE — Assessment & Plan Note (Signed)
His St. Jude DDD PM is working normally. Will recheck in several months. 

## 2014-10-07 NOTE — Patient Instructions (Signed)
Medication Instructions:  1) Decrease Carvedilol to 6.25 mg twice daily   Labwork: None ordered  Testing/Procedures: Your physician has requested that you have an echocardiogram. Echocardiography is a painless test that uses sound waves to create images of your heart. It provides your doctor with information about the size and shape of your heart and how well your heart's chambers and valves are working. This procedure takes approximately one hour. There are no restrictions for this procedure.    Follow-Up: Your physician wants you to follow-up in: 12 months with Dr Knox Saliva will receive a reminder letter in the mail two months in advance. If you don't receive a letter, please call our office to schedule the follow-up appointment.   Remote monitoring is used to monitor your Pacemaker of ICD from home. This monitoring reduces the number of office visits required to check your device to one time per year. It allows [place] to keep an eye on the functioning of your device to ensure it is working properly. You are scheduled for a device check from home on 01/06/15. You may send your transmission at any time that day. If you have a wireless device, the transmission will be sent automatically. After your physician reviews your transmission, you will receive a postcard with your next transmission date.    Any Other Special Instructions Will Be Listed Below (If Applicable).

## 2014-10-07 NOTE — Progress Notes (Signed)
HPI Andrew Vance returns today for followup. He is a pleasant 79 yo man with a h/o atrial fib, symptomatic bradycardia and LBBB. He is s/p DDD PM and only paces 20% of the time. He has class 2 CHF symptoms. His EF was 15-20% initially then 25-30% by repeat echo. He has been on a beta blocker and ACE inhibitor and with initiation of lasix his dyspnea has resolved. He denies chest pain. Minimal peripheral edema.   Allergies  Allergen Reactions  . Zithromax [Azithromycin]     Affects Coumadin levels      Current Outpatient Prescriptions  Medication Sig Dispense Refill  . amoxicillin (AMOXIL) 500 MG tablet Take 500 mg by mouth as needed (take before dental work.).    Marland Kitchen calcipotriene (DOVONOX) 0.005 % cream Apply 1 application topically 2 (two) times daily as needed. rash    . carvedilol (COREG) 12.5 MG tablet Take 1 tablet (12.5 mg total) by mouth 2 (two) times daily with a meal. 60 tablet 11  . CRESTOR 10 MG tablet TAKE 1 TABLET EVERY DAY 90 tablet 1  . fluocinolone (SYNALAR) 0.01 % external solution Apply topically 2 (two) times daily. 60 mL 11  . furosemide (LASIX) 40 MG tablet TAKE 1 TABLET (40 MG TOTAL) BY MOUTH DAILY. 30 tablet 3  . hydrocortisone cream 1 % Apply 1 application topically as needed for itching.    . latanoprost (XALATAN) 0.005 % ophthalmic solution Place 1 drop into both eyes at bedtime.    Marland Kitchen lisinopril (PRINIVIL,ZESTRIL) 10 MG tablet TAKE 1 TABLET (10 MG TOTAL) BY MOUTH DAILY. 90 tablet 1  . Lutein 20 MG CAPS Take 1 capsule by mouth at bedtime.     Marland Kitchen spironolactone (ALDACTONE) 25 MG tablet TAKE 1 TABLET (25 MG TOTAL) BY MOUTH DAILY. 90 tablet 1  . warfarin (COUMADIN) 5 MG tablet TAKE AS DIRECTED BY COUMADIN CLINIC 130 tablet 1   No current facility-administered medications for this visit.     Past Medical History  Diagnosis Date  . Psoriatic arthritis   . GERD (gastroesophageal reflux disease)   . Hypertension   . Atrial fibrillation     sees Dr. Virl Axe  . Tachycardia-bradycardia syndrome   . GI bleed     hx  . Hyperlipidemia   . Pacemaker   . H/O hiatal hernia   . Arthritis   . Anemia, iron deficiency     hx  . GI bleed     hx  . Complication of anesthesia     BP elevated with spinal     ROS:   All systems reviewed and negative except as noted in the HPI.   Past Surgical History  Procedure Laterality Date  . Total hip arthroplasty  1998 and 2001    bilateral, sees Dr. Para March   . Appendectomy    . Hand surgery  1978    lft hand, fusions to DIPs and PIPs of fingers 1,2,4,and 5  . Colonoscopy  05-27-10    per Dr, Ardis Hughs, extensive diverticulosis, repeat in 5 yrs   . Esophagogastroduodenoscopy  05-27-10    per Dr. Ardis Hughs, normal   . Cardiac catheterization  09/10/08  . Pilonidal cyst excision  61  . Total hip arthroplasty  02/25/2012    left hip  . Insert / replace / remove pacemaker  2010  . Total hip revision  02/25/2012    Procedure: TOTAL HIP REVISION;  Surgeon: Kerin Salen, MD;  Location: [facility]  OR;  Service: Orthopedics;  Laterality: Left;     Family History  Problem Relation Age of Onset  . Hypertension Other   . Heart disease Other      History   Social History  . Marital Status: Married    Spouse Name: N/A  . Number of Children: N/A  . Years of Education: N/A   Occupational History  . Not on file.   Social History Main Topics  . Smoking status: Former Smoker -- 1.00 packs/day for 30 years    Types: Cigarettes, Pipe    Quit date: 02/17/1982  . Smokeless tobacco: Former Systems developer    Quit date: 05/01/1983  . Alcohol Use: 0.6 oz/week    1 Standard drinks or equivalent per week     Comment: occ  . Drug Use: No  . Sexual Activity: Not on file   Other Topics Concern  . Not on file   Social History Narrative     BP 130/68 mmHg  Pulse 77  Ht 5\' 9"  (1.753 m)  Wt 197 lb 6.4 oz (89.54 kg)  BMI 29.14 kg/m2  SpO2 99%  Physical Exam:  Well appearing 79 yo man, NAD HEENT: Unremarkable Neck:   7 cm JVD, no thyromegally Back:  No CVA tenderness Lungs:  Clear with basilar rales HEART:  Regular rate rhythm, no murmurs, no rubs, no clicks Abd:  soft, positive bowel sounds, no organomegally, no rebound, no guarding Ext:  2 plus pulses, no edema, no cyanosis, no clubbing Skin:  No rashes no nodules Neuro:  CN II through XII intact, motor grossly intact   DEVICE  Normal device function.  See PaceArt for details.   Assess/Plan:

## 2014-10-14 ENCOUNTER — Ambulatory Visit (HOSPITAL_COMMUNITY): Payer: PPO | Attending: Internal Medicine

## 2014-10-14 ENCOUNTER — Other Ambulatory Visit: Payer: Self-pay

## 2014-10-14 DIAGNOSIS — I429 Cardiomyopathy, unspecified: Secondary | ICD-10-CM

## 2014-10-14 DIAGNOSIS — R9430 Abnormal result of cardiovascular function study, unspecified: Secondary | ICD-10-CM

## 2014-10-14 DIAGNOSIS — R0989 Other specified symptoms and signs involving the circulatory and respiratory systems: Secondary | ICD-10-CM

## 2014-10-26 ENCOUNTER — Ambulatory Visit (INDEPENDENT_AMBULATORY_CARE_PROVIDER_SITE_OTHER): Payer: PPO | Admitting: *Deleted

## 2014-10-26 DIAGNOSIS — I4891 Unspecified atrial fibrillation: Secondary | ICD-10-CM | POA: Diagnosis not present

## 2014-10-26 DIAGNOSIS — Z5181 Encounter for therapeutic drug level monitoring: Secondary | ICD-10-CM

## 2014-10-26 LAB — POCT INR: INR: 2.4

## 2014-10-29 ENCOUNTER — Other Ambulatory Visit: Payer: Self-pay

## 2014-10-29 ENCOUNTER — Telehealth: Payer: Self-pay

## 2014-10-29 DIAGNOSIS — Z01812 Encounter for preprocedural laboratory examination: Secondary | ICD-10-CM

## 2014-10-29 NOTE — Telephone Encounter (Signed)
Left message for patient to call back with pt wife.  Told her our office hours and that if calls next week 7/4-7/8 to ask for Sherri.

## 2014-10-29 NOTE — Telephone Encounter (Signed)
Spoke with pt aware of need for device upgrade r/t to test results.  Pt scheduled for procedure on 7/21 @ 9:30AM. Pt aware to hold coumindan for 2 days, last dose on 7/18. Pt understands he will needs to come in for lab work on 7/13. Pt aware letter in the mail with details we discussed and to call our office if he has any questions.

## 2014-11-06 ENCOUNTER — Other Ambulatory Visit: Payer: Self-pay | Admitting: Family Medicine

## 2014-11-10 ENCOUNTER — Other Ambulatory Visit (INDEPENDENT_AMBULATORY_CARE_PROVIDER_SITE_OTHER): Payer: PPO | Admitting: *Deleted

## 2014-11-10 DIAGNOSIS — Z01812 Encounter for preprocedural laboratory examination: Secondary | ICD-10-CM | POA: Diagnosis not present

## 2014-11-10 LAB — BASIC METABOLIC PANEL
BUN: 27 mg/dL — AB (ref 6–23)
CHLORIDE: 102 meq/L (ref 96–112)
CO2: 25 mEq/L (ref 19–32)
Calcium: 9.6 mg/dL (ref 8.4–10.5)
Creatinine, Ser: 1.16 mg/dL (ref 0.40–1.50)
GFR: 64.93 mL/min (ref >60.00)
GLUCOSE: 98 mg/dL (ref 70–99)
Potassium: 4.8 mEq/L (ref 3.5–5.1)
Sodium: 135 mEq/L (ref 135–145)

## 2014-11-10 LAB — CBC WITH DIFFERENTIAL/PLATELET
Basophils Absolute: 0 10*3/uL (ref 0.0–0.1)
Basophils Relative: 0.3 % (ref 0.0–3.0)
EOS ABS: 0.1 10*3/uL (ref 0.0–0.7)
EOS PCT: 1.8 % (ref 0.0–5.0)
HEMATOCRIT: 42.4 % (ref 39.0–52.0)
HEMOGLOBIN: 14 g/dL (ref 13.0–17.0)
Lymphocytes Relative: 13.5 % (ref 12.0–46.0)
Lymphs Abs: 1.1 10*3/uL (ref 0.7–4.0)
MCHC: 33 g/dL (ref 30.0–36.0)
MCV: 97.5 fl (ref 78.0–100.0)
MONOS PCT: 8.7 % (ref 3.0–12.0)
Monocytes Absolute: 0.7 10*3/uL (ref 0.1–1.0)
NEUTROS PCT: 75.7 % (ref 43.0–77.0)
Neutro Abs: 5.9 10*3/uL (ref 1.4–7.7)
Platelets: 182 10*3/uL (ref 150.0–400.0)
RBC: 4.34 Mil/uL (ref 4.22–5.81)
RDW: 13.9 % (ref 11.5–15.5)
WBC: 7.8 10*3/uL (ref 4.0–10.5)

## 2014-11-10 LAB — PROTIME-INR
INR: 2.2 ratio — ABNORMAL HIGH (ref 0.8–1.0)
Prothrombin Time: 23.6 s — ABNORMAL HIGH (ref 9.6–13.1)

## 2014-11-11 ENCOUNTER — Other Ambulatory Visit (HOSPITAL_COMMUNITY): Payer: Self-pay | Admitting: *Deleted

## 2014-11-11 DIAGNOSIS — I519 Heart disease, unspecified: Secondary | ICD-10-CM

## 2014-11-11 DIAGNOSIS — I5022 Chronic systolic (congestive) heart failure: Secondary | ICD-10-CM

## 2014-11-18 ENCOUNTER — Encounter (HOSPITAL_COMMUNITY)
Admission: RE | Disposition: A | Discharge status: Home / Self Care | Payer: Self-pay | Source: Ambulatory Visit | Attending: Internal Medicine

## 2014-11-18 ENCOUNTER — Encounter (HOSPITAL_COMMUNITY): Payer: Self-pay | Admitting: General Practice

## 2014-11-18 ENCOUNTER — Ambulatory Visit (HOSPITAL_COMMUNITY)
Admission: RE | Admit: 2014-11-18 | Discharge: 2014-11-19 | Disposition: A | Discharge status: Home / Self Care | Payer: PPO | Source: Ambulatory Visit | Attending: Internal Medicine | Admitting: Internal Medicine

## 2014-11-18 DIAGNOSIS — Z79899 Other long term (current) drug therapy: Secondary | ICD-10-CM | POA: Insufficient documentation

## 2014-11-18 DIAGNOSIS — Z7901 Long term (current) use of anticoagulants: Secondary | ICD-10-CM | POA: Diagnosis not present

## 2014-11-18 DIAGNOSIS — I482 Chronic atrial fibrillation: Secondary | ICD-10-CM | POA: Diagnosis not present

## 2014-11-18 DIAGNOSIS — I495 Sick sinus syndrome: Secondary | ICD-10-CM | POA: Insufficient documentation

## 2014-11-18 DIAGNOSIS — L4050 Arthropathic psoriasis, unspecified: Secondary | ICD-10-CM | POA: Insufficient documentation

## 2014-11-18 DIAGNOSIS — I447 Left bundle-branch block, unspecified: Secondary | ICD-10-CM | POA: Diagnosis not present

## 2014-11-18 DIAGNOSIS — E785 Hyperlipidemia, unspecified: Secondary | ICD-10-CM | POA: Diagnosis not present

## 2014-11-18 DIAGNOSIS — I5022 Chronic systolic (congestive) heart failure: Secondary | ICD-10-CM | POA: Diagnosis present

## 2014-11-18 DIAGNOSIS — I10 Essential (primary) hypertension: Secondary | ICD-10-CM | POA: Insufficient documentation

## 2014-11-18 DIAGNOSIS — I4891 Unspecified atrial fibrillation: Secondary | ICD-10-CM

## 2014-11-18 DIAGNOSIS — Z7952 Long term (current) use of systemic steroids: Secondary | ICD-10-CM | POA: Insufficient documentation

## 2014-11-18 DIAGNOSIS — I519 Heart disease, unspecified: Secondary | ICD-10-CM

## 2014-11-18 DIAGNOSIS — Z95810 Presence of automatic (implantable) cardiac defibrillator: Secondary | ICD-10-CM | POA: Diagnosis present

## 2014-11-18 DIAGNOSIS — I429 Cardiomyopathy, unspecified: Secondary | ICD-10-CM | POA: Diagnosis not present

## 2014-11-18 DIAGNOSIS — Z87891 Personal history of nicotine dependence: Secondary | ICD-10-CM | POA: Diagnosis not present

## 2014-11-18 DIAGNOSIS — I428 Other cardiomyopathies: Secondary | ICD-10-CM | POA: Insufficient documentation

## 2014-11-18 HISTORY — PX: EP IMPLANTABLE DEVICE: SHX172B

## 2014-11-18 HISTORY — PX: CARDIAC DEFIBRILLATOR PLACEMENT: SHX171

## 2014-11-18 HISTORY — DX: Presence of automatic (implantable) cardiac defibrillator: Z95.810

## 2014-11-18 HISTORY — DX: Bronchopneumonia, unspecified organism: J18.0

## 2014-11-18 LAB — PROTIME-INR
INR: 1.56 — AB (ref 0.00–1.49)
Prothrombin Time: 18.7 seconds — ABNORMAL HIGH (ref 11.6–15.2)

## 2014-11-18 LAB — SURGICAL PCR SCREEN
MRSA, PCR: NEGATIVE
STAPHYLOCOCCUS AUREUS: POSITIVE — AB

## 2014-11-18 SURGERY — BIV UPGRADE
Anesthesia: LOCAL

## 2014-11-18 MED ORDER — LIDOCAINE HCL (PF) 1 % IJ SOLN
INTRAMUSCULAR | Status: DC | PRN
  Administered 2014-11-18: 58 mL

## 2014-11-18 MED ORDER — SODIUM CHLORIDE 0.9 % IV SOLN
INTRAVENOUS | Status: DC
  Administered 2014-11-18: 10:00:00 via INTRAVENOUS

## 2014-11-18 MED ORDER — CEFAZOLIN SODIUM-DEXTROSE 2-3 GM-% IV SOLR
INTRAVENOUS | Status: AC
  Filled 2014-11-18: qty 50

## 2014-11-18 MED ORDER — CARVEDILOL 6.25 MG PO TABS
6.2500 mg | ORAL_TABLET | Freq: Two times a day (BID) | ORAL | Status: DC
  Administered 2014-11-18 – 2014-11-19 (×2): 6.25 mg via ORAL
  Filled 2014-11-18 (×4): qty 1

## 2014-11-18 MED ORDER — SODIUM CHLORIDE 0.9 % IR SOLN: 80.0000 mg | Status: DC

## 2014-11-18 MED ORDER — LIDOCAINE HCL (PF) 1 % IJ SOLN
INTRAMUSCULAR | Status: AC
  Filled 2014-11-18: qty 60

## 2014-11-18 MED ORDER — ACETAMINOPHEN 325 MG PO TABS
325.0000 mg | ORAL_TABLET | ORAL | Status: DC | PRN
  Administered 2014-11-18: 650 mg via ORAL
  Filled 2014-11-18: qty 2

## 2014-11-18 MED ORDER — MUPIROCIN 2 % EX OINT: 1.0000 "application " | TOPICAL_OINTMENT | Freq: Once | CUTANEOUS | Status: DC

## 2014-11-18 MED ORDER — SPIRONOLACTONE 25 MG PO TABS
25.0000 mg | ORAL_TABLET | Freq: Every day | ORAL | Status: DC
  Administered 2014-11-18 – 2014-11-19 (×2): 25 mg via ORAL
  Filled 2014-11-18 (×2): qty 1

## 2014-11-18 MED ORDER — VANCOMYCIN HCL IN DEXTROSE 1-5 GM/200ML-% IV SOLN
1000.0000 mg | Freq: Two times a day (BID) | INTRAVENOUS | Status: AC
  Administered 2014-11-18: 1000 mg via INTRAVENOUS
  Filled 2014-11-18: qty 200

## 2014-11-18 MED ORDER — HEPARIN (PORCINE) IN NACL 2-0.9 UNIT/ML-% IJ SOLN
INTRAMUSCULAR | Status: DC | PRN
Start: 2014-11-18 — End: 2014-11-18
  Administered 2014-11-18: 500 mL

## 2014-11-18 MED ORDER — IOHEXOL 350 MG/ML SOLN
INTRAVENOUS | Status: DC | PRN
  Administered 2014-11-18: 30 mL via INTRACARDIAC

## 2014-11-18 MED ORDER — MIDAZOLAM HCL 5 MG/5ML IJ SOLN
INTRAMUSCULAR | Status: AC
  Filled 2014-11-18: qty 5

## 2014-11-18 MED ORDER — MUPIROCIN 2 % EX OINT
TOPICAL_OINTMENT | CUTANEOUS | Status: AC
  Administered 2014-11-18: 1 via NASAL
  Filled 2014-11-18: qty 22

## 2014-11-18 MED ORDER — SODIUM CHLORIDE 0.9 % IR SOLN
Status: AC
  Filled 2014-11-18: qty 2

## 2014-11-18 MED ORDER — WARFARIN SODIUM 2.5 MG PO TABS
2.5000 mg | ORAL_TABLET | Freq: Once | ORAL | Status: AC
  Administered 2014-11-18: 2.5 mg via ORAL
  Filled 2014-11-18: qty 1

## 2014-11-18 MED ORDER — MIDAZOLAM HCL 5 MG/5ML IJ SOLN
INTRAMUSCULAR | Status: DC | PRN
  Administered 2014-11-18 (×8): 1 mg via INTRAVENOUS

## 2014-11-18 MED ORDER — LATANOPROST 0.005 % OP SOLN
1.0000 [drp] | Freq: Every day | OPHTHALMIC | Status: DC
  Administered 2014-11-18: 1 [drp] via OPHTHALMIC
  Filled 2014-11-18 (×2): qty 2.5

## 2014-11-18 MED ORDER — LISINOPRIL 5 MG PO TABS
5.0000 mg | ORAL_TABLET | Freq: Every day | ORAL | Status: DC
  Administered 2014-11-18 – 2014-11-19 (×2): 5 mg via ORAL
  Filled 2014-11-18 (×2): qty 1

## 2014-11-18 MED ORDER — CEFAZOLIN SODIUM-DEXTROSE 2-3 GM-% IV SOLR
2.0000 g | INTRAVENOUS | Status: AC
  Administered 2014-11-18: 2 g via INTRAVENOUS

## 2014-11-18 MED ORDER — ONDANSETRON HCL 4 MG/2ML IJ SOLN: 4.0000 mg | Freq: Four times a day (QID) | INTRAMUSCULAR | Status: DC | PRN

## 2014-11-18 MED ORDER — FUROSEMIDE 40 MG PO TABS
40.0000 mg | ORAL_TABLET | Freq: Every day | ORAL | Status: DC
  Administered 2014-11-18 – 2014-11-19 (×2): 40 mg via ORAL
  Filled 2014-11-18 (×2): qty 1

## 2014-11-18 MED ORDER — FENTANYL CITRATE (PF) 100 MCG/2ML IJ SOLN
INTRAMUSCULAR | Status: AC
  Filled 2014-11-18: qty 2

## 2014-11-18 MED ORDER — WARFARIN - PHYSICIAN DOSING INPATIENT: Freq: Every day | Status: DC

## 2014-11-18 MED ORDER — SODIUM CHLORIDE 0.9 % IR SOLN
Status: DC | PRN
  Administered 2014-11-18: 14:00:00

## 2014-11-18 MED ORDER — FENTANYL CITRATE (PF) 100 MCG/2ML IJ SOLN
INTRAMUSCULAR | Status: DC | PRN
  Administered 2014-11-18 (×5): 12.5 ug via INTRAVENOUS
  Administered 2014-11-18: 25 ug via INTRAVENOUS
  Administered 2014-11-18: 12.5 ug via INTRAVENOUS

## 2014-11-18 SURGICAL SUPPLY — 17 items
ASSURA CRTD CD3369-40C (ICD Generator) ×2 IMPLANT
BALLN COR SINUS VENO 6FR 80 (BALLOONS) ×2
BALLOON COR SINUS VENO 6FR 80 (BALLOONS) IMPLANT
CABLE SURGICAL S-101-97-12 (CABLE) ×1 IMPLANT
CATH CPS DIRECT 135 DS2C020 (CATHETERS) ×2 IMPLANT
CATH CPS QUART SUB DS2N027-59 (CATHETERS) ×1 IMPLANT
CATH HEX JOSEPH 2-5-2 65CM 6F (CATHETERS) ×1 IMPLANT
DEFIB ASSURA CRT-D (ICD Generator) IMPLANT
LEAD DURATA 7122-65CM (Lead) ×1 IMPLANT
LEAD QUARTET 1456Q-86 (Lead) IMPLANT
PAD DEFIB LIFELINK (PAD) ×1 IMPLANT
QUARTET 1456Q-86 (Lead) ×2 IMPLANT
SHEATH COOK PEEL AWAY SET 9F (SHEATH) ×1 IMPLANT
SHIELD RADPAD SCOOP 12X17 (MISCELLANEOUS) ×1 IMPLANT
SLITTER UNIVERSAL DS2A003 (MISCELLANEOUS) ×1 IMPLANT
TRAY PACEMAKER INSERTION (CUSTOM PROCEDURE TRAY) ×1 IMPLANT
WIRE MAILMAN 182CM (WIRE) ×1 IMPLANT

## 2014-11-18 NOTE — H&P (Signed)
HPI [name] returns today for followup. He is a pleasant 79 yo man with a h/o atrial fib, symptomatic bradycardia and LBBB. He is s/p DDD PM and only paces 20% of the time. He has class 2 CHF symptoms. His EF was 15-20% initially then 25-30% by repeat echo. He has been on a beta blocker and ACE inhibitor and with initiation of lasix his dyspnea has resolved. He denies chest pain. Minimal peripheral edema.  Allergies  Allergen Reactions  . Zithromax [Azithromycin]     Affects Coumadin levels      Current Outpatient Prescriptions  Medication Sig Dispense Refill  . amoxicillin (AMOXIL) 500 MG tablet Take 500 mg by mouth as needed (take before dental work.).    Marland Kitchen calcipotriene (DOVONOX) 0.005 % cream Apply 1 application topically 2 (two) times daily as needed. rash    . carvedilol (COREG) 12.5 MG tablet Take 1 tablet (12.5 mg total) by mouth 2 (two) times daily with a meal. 60 tablet 11  . CRESTOR 10 MG tablet TAKE 1 TABLET EVERY DAY 90 tablet 1  . fluocinolone (SYNALAR) 0.01 % external solution Apply topically 2 (two) times daily. 60 mL 11  . furosemide (LASIX) 40 MG tablet TAKE 1 TABLET (40 MG TOTAL) BY MOUTH DAILY. 30 tablet 3  . hydrocortisone cream 1 % Apply 1 application topically as needed for itching.    . latanoprost (XALATAN) 0.005 % ophthalmic solution Place 1 drop into both eyes at bedtime.    Marland Kitchen lisinopril (PRINIVIL,ZESTRIL) 10 MG tablet TAKE 1 TABLET (10 MG TOTAL) BY MOUTH DAILY. 90 tablet 1  . Lutein 20 MG CAPS Take 1 capsule by mouth at bedtime.     Marland Kitchen spironolactone (ALDACTONE) 25 MG tablet TAKE 1 TABLET (25 MG TOTAL) BY MOUTH DAILY. 90 tablet 1  . warfarin (COUMADIN) 5 MG tablet TAKE AS DIRECTED BY COUMADIN CLINIC 130 tablet 1   No current facility-administered medications for this visit.     Past Medical History  Diagnosis Date  . Psoriatic arthritis   . GERD (gastroesophageal  reflux disease)   . Hypertension   . Atrial fibrillation     sees Dr. Virl Axe  . Tachycardia-bradycardia syndrome   . GI bleed     hx  . Hyperlipidemia   . Pacemaker   . H/O hiatal hernia   . Arthritis   . Anemia, iron deficiency     hx  . GI bleed     hx  . Complication of anesthesia     BP elevated with spinal     ROS:  All systems reviewed and negative except as noted in the HPI.   Past Surgical History  Procedure Laterality Date  . Total hip arthroplasty  1998 and 2001    bilateral, sees Dr. Para March   . Appendectomy    . Hand surgery  1978    lft hand, fusions to DIPs and PIPs of fingers 1,2,4,and 5  . Colonoscopy  05-27-10    per Dr, Ardis Hughs, extensive diverticulosis, repeat in 5 yrs   . Esophagogastroduodenoscopy  05-27-10    per Dr. Ardis Hughs, normal   . Cardiac catheterization  09/10/08  . Pilonidal cyst excision  61  . Total hip arthroplasty  02/25/2012    left hip  . Insert / replace / remove pacemaker  2010  . Total hip revision  02/25/2012    Procedure: TOTAL HIP REVISION; Surgeon: Kerin Salen, MD; Location: [place]; Service: Orthopedics; Laterality: Left;  Family History  Problem Relation Age of Onset  . Hypertension Other   . Heart disease Other      History   Social History  . Marital Status: Married    Spouse Name: N/A  . Number of Children: N/A  . Years of Education: N/A   Occupational History  . Not on file.   Social History Main Topics  . Smoking status: Former Smoker -- 1.00 packs/day for 30 years    Types: Cigarettes, Pipe    Quit date: 02/17/1982  . Smokeless tobacco: Former Systems developer    Quit date: 05/01/1983  . Alcohol Use: 0.6 oz/week    1 Standard drinks or equivalent per week     Comment: occ  . Drug Use: No  . Sexual Activity: Not on  file   Other Topics Concern  . Not on file   Social History Narrative     BP 130/68 mmHg  Pulse 77  Ht 5\' 9"  (1.753 m)  Wt 197 lb 6.4 oz (89.54 kg)  BMI 29.14 kg/m2  SpO2 99%  Physical Exam:  Well appearing 79 yo man, NAD HEENT: Unremarkable Neck: 7 cm JVD, no thyromegally Back: No CVA tenderness Lungs: Clear with basilar rales HEART: Regular rate rhythm, no murmurs, no rubs, no clicks Abd: soft, positive bowel sounds, no organomegally, no rebound, no guarding Ext: 2 plus pulses, no edema, no cyanosis, no clubbing Skin: No rashes no nodules Neuro: CN II through XII intact, motor grossly intact   DEVICE  Normal device function. See PaceArt for details.   Assess/Plan:            Chronic systolic heart failure - Evans Lance, MD at 10/07/2014 9:56 AM     Status: Written Related Problem: Chronic systolic heart failure   Expand All Collapse All   His symptoms are class 2. Mostly low output and we have asked him to reduce his dose of coreg as he has had dizziness. He will undergo repeat echo as he had gone from 20% to 30%. If his EF remains down, then upgrade to a BiV ICD would be indicated. I have discussed the risks/benefits of the procedure with the patient and he wishes to proceed.            HYPERTENSION, BENIGN - Evans Lance, MD at 10/07/2014 9:57 AM     Status: Written Related Problem: HYPERTENSION, BENIGN   Expand All Collapse All   His blood pressure is well controlled. Will follow.             Pacemaker-DDD [address] - Evans Lance, MD at 10/07/2014 9:57 AM     Status: Written Related Problem: Pacemaker-DDD Berna Spare   Expand All Collapse All   His St. Jude DDD PM is working normally. Will recheck in several months.            Orthostatic hypotension - Evans Lance, MD at 10/07/2014 9:58 AM     Status: Written Related Problem: Orthostatic hypotension   Expand All Collapse All   I have asked the  patient to reduce his dose or coreg.          EP Attending  Patient seen and examined. Agree with above. Plan to proceed with BiV ICD upgrade from a DDD PM. The risks/benefit/goals/expectations of the procedure have been discussed with the patient and he wishes to proceed.  [name].D.

## 2014-11-18 NOTE — H&P (Signed)
  ICD Criteria  Current LVEF:25%. Within 12 months prior to implant: Yes   Heart failure history: Yes, Class III  Cardiomyopathy history: Yes, Non-Ischemic Cardiomyopathy.  Atrial Fibrillation/Atrial Flutter: Yes, Long standing (> 1 Year).  Ventricular tachycardia history: No.  Cardiac arrest history: No.  History of syndromes with risk of sudden death: No.  Previous ICD: No.  Current ICD indication: Primary  PPM indication: Yes. Greater than 40% RV pacing requirement anticipated. Class I or II Loletha Grayer indication present: Yes  Beta Blocker therapy for 3 or more months: Yes, prescribed.   Ace Inhibitor/ARB therapy for 3 or more months: Yes, prescribed.

## 2014-11-19 ENCOUNTER — Encounter (HOSPITAL_COMMUNITY): Payer: Self-pay | Admitting: Internal Medicine

## 2014-11-19 ENCOUNTER — Ambulatory Visit (HOSPITAL_COMMUNITY): Payer: PPO

## 2014-11-19 DIAGNOSIS — I482 Chronic atrial fibrillation: Secondary | ICD-10-CM | POA: Diagnosis not present

## 2014-11-19 DIAGNOSIS — I5022 Chronic systolic (congestive) heart failure: Secondary | ICD-10-CM | POA: Diagnosis not present

## 2014-11-19 DIAGNOSIS — I429 Cardiomyopathy, unspecified: Secondary | ICD-10-CM | POA: Diagnosis not present

## 2014-11-19 DIAGNOSIS — L4050 Arthropathic psoriasis, unspecified: Secondary | ICD-10-CM | POA: Diagnosis not present

## 2014-11-19 MED ORDER — DIGOXIN 125 MCG PO TABS: 0.1250 mg | ORAL_TABLET | Freq: Every day | ORAL | Status: DC

## 2014-11-19 MED ORDER — MUPIROCIN 2 % EX OINT
1.0000 "application " | TOPICAL_OINTMENT | Freq: Two times a day (BID) | CUTANEOUS | Status: DC
  Administered 2014-11-19: 1 via NASAL
  Filled 2014-11-19: qty 22

## 2014-11-19 MED ORDER — CHLORHEXIDINE GLUCONATE CLOTH 2 % EX PADS
6.0000 | MEDICATED_PAD | Freq: Every day | CUTANEOUS | Status: DC
  Administered 2014-11-19: 6 via TOPICAL

## 2014-11-19 NOTE — Progress Notes (Signed)
Orders received for pt discharge.  Discharge summary printed and reviewed with pt.  Explained medication regimen, and pt had no further questions at this time.  IV removed and site remains clean, dry, intact.  Telemetry removed.  Pt in stable condition and awaiting transport. 

## 2014-11-19 NOTE — Progress Notes (Signed)
Removed ICD dressing prior to discharge per MDO.

## 2014-11-19 NOTE — Discharge Summary (Signed)
Physician Discharge Summary  Patient ID: Andrew Vance MRN: [id number] DOB/AGE: [birth year] 79 y.o.   Primary Cardiologist: Dr. Lovena Le  Admit date: 11/18/2014 Discharge date: 11/19/2014  Admission Diagnoses: chronic systolic heart failure, atrial fibrillation, LBBB  Discharge Diagnoses:  Active Problems:   Chronic systolic heart failure   ICD (implantable cardioverter-defibrillator) in place   Discharged Condition: stable  Hospital Course: The patient was admitted for upgrade from a DDD PM to a BiV ICD in the setting of class 3 CHF, chronic atrial fibrillation and LBBB, despite maximal medical therapy. He underwent successful upgrade to a BiV ICD with a St. Jude device placed. Today his device is working normally. CXR is ok. He is stable for discharge. He is on an ARB and beta blocker. I would like digoxin 0.125 mg daily added.   Consults: None  Significant Diagnostic Studies: none  Treatments: See Hospital Course  Discharge Exam: Blood pressure 116/74, pulse 71, temperature 98 F (36.7 C), temperature source Oral, resp. rate 16, height 5\' 10"  (1.778 m), weight 192 lb 1.6 oz (87.136 kg), SpO2 98 %. General appearance: alert, cooperative and no distress Head: Normocephalic, without obvious abnormality, atraumatic Neck: no carotid bruit and no JVD Resp: clear to auscultation bilaterally Cardio: regular rate and rhythm, S1, S2 normal, no murmur, click, rub or gallop GI: soft, non-tender; bowel sounds normal; no masses,  no organomegaly Extremities: extremities normal, atraumatic, no cyanosis or edema Pulses: 2+ and symmetric  Disposition: 01-Home or Self Care      Discharge Instructions    Diet - low sodium heart healthy    Complete by:  As directed      Increase activity slowly    Complete by:  As directed             Medication List    TAKE these medications        amoxicillin 500 MG tablet  Commonly known as:  AMOXIL  Take 2,000 mg by mouth as needed (take  before dental work.).     calcipotriene 0.005 % cream  Commonly known as:  DOVONOX  Apply 1 application topically 2 (two) times daily as needed. rash     carvedilol 12.5 MG tablet  Commonly known as:  COREG  Take 0.5 tablets (6.25 mg total) by mouth 2 (two) times daily with a meal.     CRESTOR 10 MG tablet  Generic drug:  rosuvastatin  TAKE 1 TABLET EVERY DAY     fluocinolone 0.01 % external solution  Commonly known as:  SYNALAR  Apply topically 2 (two) times daily.     furosemide 40 MG tablet  Commonly known as:  LASIX  TAKE 1 TABLET (40 MG TOTAL) BY MOUTH DAILY.     hydrocortisone cream 1 %  Apply 1 application topically as needed for itching.     latanoprost 0.005 % ophthalmic solution  Commonly known as:  XALATAN  Place 1 drop into both eyes at bedtime.     lisinopril 10 MG tablet  Commonly known as:  PRINIVIL,ZESTRIL  TAKE 1 TABLET (10 MG TOTAL) BY MOUTH DAILY.     Lutein 20 MG Caps  Take 20 mg by mouth at bedtime.     naproxen sodium 220 MG tablet  Commonly known as:  ANAPROX  Take 220 mg by mouth daily as needed (for pain).     spironolactone 25 MG tablet  Commonly known as:  ALDACTONE  TAKE 1 TABLET (25 MG TOTAL) BY MOUTH DAILY.  warfarin 5 MG tablet  Commonly known as:  COUMADIN  TAKE AS DIRECTED BY COUMADIN CLINIC      digoxin 0.125 mg daily Follow-up Information    Follow up with [facility] On 11/29/2014.   Why:  10:30 am , For suture removal, For wound re-check   Contact information:   [address] [place] [address]      TIME SPENT ON DISCHARGE, INCLUDING PHYSICIAN TIME: >30 MINUTES  Signed: Lyda Jester 11/19/2014, 9:50 AM  EP Attending  Patient seen and examined. Agree with the findings as noted above. Rafael Capo for discharge with usual followup. I would like him on Digoxin 0.125 mg daily.  [name].D.

## 2014-11-19 NOTE — Discharge Instructions (Signed)
Supplemental Discharge Instructions for  Pacemaker/Defibrillator Patients  Activity No heavy lifting or vigorous activity with your left/right arm for 6 to 8 weeks.  Do not raise your left/right arm above your head for one week.  Gradually raise your affected arm as drawn below.                    11/20/14                  11/22/14                  11/24/14                11/26/14           NO DRIVING for  7 days    ; you may begin driving on     01/13/93    . WOUND CARE - Keep the wound area clean and dry.  Do not get this area wet for one week. No showers for one week; you may shower on      11/26/14       . - The tape/steri-strips on your wound will fall off; do not pull them off.  No bandage is needed on the site.  DO  NOT apply any creams, oils, or ointments to the wound area. - If you notice any drainage or discharge from the wound, any swelling or bruising at the site, or you develop a fever > 101? F after you are discharged home, call the office at once.  Special Instructions - You are still able to use cellular telephones; use the ear opposite the side where you have your pacemaker/defibrillator.  Avoid carrying your cellular phone near your device. - When traveling through airports, show security personnel your identification card to avoid being screened in the metal detectors.  Ask the security personnel to use the hand wand. - Avoid arc welding equipment, MRI testing (magnetic resonance imaging), TENS units (transcutaneous nerve stimulators).  Call the office for questions about other devices. - Avoid electrical appliances that are in poor condition or are not properly grounded. - Microwave ovens are safe to be near or to operate.  Additional information for defibrillator patients should your device go off: - If your device goes off ONCE and you feel fine afterward, notify the device clinic nurses. - If your device goes off ONCE and you do not feel well afterward, call 911. - If your  device goes off TWICE, call 911. - If your device goes off THREE times in one day, call 911.  DO NOT DRIVE YOURSELF OR A FAMILY MEMBER WITH A DEFIBRILLATOR TO THE HOSPITAL--CALL 911.

## 2014-11-23 ENCOUNTER — Encounter (HOSPITAL_COMMUNITY): Payer: Self-pay | Admitting: Internal Medicine

## 2014-11-25 ENCOUNTER — Encounter: Payer: Self-pay | Admitting: Family Medicine

## 2014-11-25 ENCOUNTER — Ambulatory Visit (INDEPENDENT_AMBULATORY_CARE_PROVIDER_SITE_OTHER): Payer: PPO | Admitting: Family Medicine

## 2014-11-25 VITALS — BP 112/76 | HR 84 | Temp 97.9°F | Ht 70.0 in | Wt 195.0 lb

## 2014-11-25 DIAGNOSIS — Z95810 Presence of automatic (implantable) cardiac defibrillator: Secondary | ICD-10-CM

## 2014-11-25 DIAGNOSIS — I429 Cardiomyopathy, unspecified: Secondary | ICD-10-CM

## 2014-11-25 DIAGNOSIS — I5022 Chronic systolic (congestive) heart failure: Secondary | ICD-10-CM | POA: Diagnosis not present

## 2014-11-25 DIAGNOSIS — I10 Essential (primary) hypertension: Secondary | ICD-10-CM | POA: Diagnosis not present

## 2014-11-25 DIAGNOSIS — I482 Chronic atrial fibrillation, unspecified: Secondary | ICD-10-CM

## 2014-11-25 NOTE — Progress Notes (Signed)
Pre visit review using our clinic review tool, if applicable. No additional management support is needed unless otherwise documented below in the visit note. 

## 2014-11-25 NOTE — Progress Notes (Signed)
   Subjective:    Patient ID: Andrew Vance, male    DOB: [birth year], 79 y.o.   MRN: [id number]  HPI Here to follow up a hospital stay from 11-18-14 to 11-19-14 for placement of an ICD. This was placed due to his hx of chronic atrial fibrillation and systolic CHF. He has done quite well and he feels fine. He is back to work full time.    Review of Systems  Constitutional: Negative.   Respiratory: Negative.   Cardiovascular: Negative.   Neurological: Negative.        Objective:   Physical Exam  Constitutional: He is oriented to person, place, and time. He appears well-developed and well-nourished.  Neck: No thyromegaly present.  Cardiovascular: Normal rate, normal heart sounds and intact distal pulses.   Irregular rhythm   Pulmonary/Chest: Effort normal and breath sounds normal.  Musculoskeletal: He exhibits no edema.  Lymphadenopathy:    He has no cervical adenopathy.  Neurological: He is alert and oriented to person, place, and time.          Assessment & Plan:  He is doing well both in general and from a cardiac standpoint. He will follow up with Dr. Lovena Le next Monday.

## 2014-11-29 ENCOUNTER — Encounter: Payer: Self-pay | Admitting: Internal Medicine

## 2014-11-29 ENCOUNTER — Ambulatory Visit (INDEPENDENT_AMBULATORY_CARE_PROVIDER_SITE_OTHER): Payer: PPO | Admitting: *Deleted

## 2014-11-29 DIAGNOSIS — I482 Chronic atrial fibrillation, unspecified: Secondary | ICD-10-CM

## 2014-11-29 DIAGNOSIS — I5022 Chronic systolic (congestive) heart failure: Secondary | ICD-10-CM

## 2014-11-29 DIAGNOSIS — I429 Cardiomyopathy, unspecified: Secondary | ICD-10-CM | POA: Diagnosis not present

## 2014-11-29 DIAGNOSIS — Z95810 Presence of automatic (implantable) cardiac defibrillator: Secondary | ICD-10-CM | POA: Diagnosis not present

## 2014-11-29 LAB — CUP PACEART INCLINIC DEVICE CHECK
Battery Remaining Longevity: 90 mo
Brady Statistic RA Percent Paced: 0 %
Brady Statistic RV Percent Paced: 30 %
HighPow Impedance: 56.25 Ohm
Lead Channel Impedance Value: 737.5 Ohm
Lead Channel Pacing Threshold Amplitude: 0.75 V
Lead Channel Pacing Threshold Pulse Width: 0.5 ms
Lead Channel Pacing Threshold Pulse Width: 0.5 ms
Lead Channel Pacing Threshold Pulse Width: 0.5 ms
Lead Channel Sensing Intrinsic Amplitude: 6.1 mV
Lead Channel Setting Pacing Amplitude: 3.5 V
Lead Channel Setting Pacing Pulse Width: 0.5 ms
MDC IDC MSMT LEADCHNL LV PACING THRESHOLD AMPLITUDE: 1 V
MDC IDC MSMT LEADCHNL LV PACING THRESHOLD AMPLITUDE: 1 V
MDC IDC MSMT LEADCHNL RA IMPEDANCE VALUE: 375 Ohm
MDC IDC MSMT LEADCHNL RA SENSING INTR AMPL: 0.7 mV
MDC IDC MSMT LEADCHNL RV IMPEDANCE VALUE: 537.5 Ohm
MDC IDC MSMT LEADCHNL RV PACING THRESHOLD AMPLITUDE: 0.75 V
MDC IDC MSMT LEADCHNL RV PACING THRESHOLD PULSEWIDTH: 0.5 ms
MDC IDC SESS DTM: 20160801110536
MDC IDC SET LEADCHNL LV PACING AMPLITUDE: 2.5 V
MDC IDC SET LEADCHNL RV PACING PULSEWIDTH: 0.5 ms
MDC IDC SET LEADCHNL RV SENSING SENSITIVITY: 0.5 mV
MDC IDC SET ZONE DETECTION INTERVAL: 350 ms
Pulse Gen Serial Number: 7289863
Zone Setting Detection Interval: 300 ms

## 2014-11-29 NOTE — Progress Notes (Signed)
CRT-D upgrade Wound check appointment. Steri-strips removed. Wound without redness or edema. Incision edges approximated---mild lateral 1 cm unapproximated section, no redness or swelling, pt asymptomatic. Normal device function. Thresholds, sensing, and impedances consistent with implant measurements. Device programmed at 3.5V for extra safety margin until 3 month visit. Histogram distribution appropriate for patient and level of activity. >99% AF + coumadin. No ventricular arrhythmias noted. Patient educated about wound care, arm mobility, lifting restrictions, shock plan. ROV w/ GT 02/28/15.

## 2014-11-30 ENCOUNTER — Ambulatory Visit: Payer: PPO | Admitting: Family Medicine

## 2014-12-07 ENCOUNTER — Ambulatory Visit (INDEPENDENT_AMBULATORY_CARE_PROVIDER_SITE_OTHER): Payer: PPO

## 2014-12-07 DIAGNOSIS — I482 Chronic atrial fibrillation, unspecified: Secondary | ICD-10-CM

## 2014-12-07 DIAGNOSIS — I4891 Unspecified atrial fibrillation: Secondary | ICD-10-CM | POA: Diagnosis not present

## 2014-12-07 DIAGNOSIS — Z5181 Encounter for therapeutic drug level monitoring: Secondary | ICD-10-CM | POA: Diagnosis not present

## 2014-12-07 LAB — POCT INR: INR: 2.3

## 2014-12-31 ENCOUNTER — Other Ambulatory Visit: Payer: Self-pay | Admitting: Family Medicine

## 2015-01-18 ENCOUNTER — Ambulatory Visit (INDEPENDENT_AMBULATORY_CARE_PROVIDER_SITE_OTHER): Payer: PPO | Admitting: *Deleted

## 2015-01-18 DIAGNOSIS — Z5181 Encounter for therapeutic drug level monitoring: Secondary | ICD-10-CM | POA: Diagnosis not present

## 2015-01-18 DIAGNOSIS — I482 Chronic atrial fibrillation, unspecified: Secondary | ICD-10-CM

## 2015-01-18 DIAGNOSIS — I4891 Unspecified atrial fibrillation: Secondary | ICD-10-CM | POA: Diagnosis not present

## 2015-01-18 LAB — POCT INR: INR: 2.6

## 2015-01-21 ENCOUNTER — Ambulatory Visit (INDEPENDENT_AMBULATORY_CARE_PROVIDER_SITE_OTHER): Payer: PPO | Admitting: Family Medicine

## 2015-01-21 DIAGNOSIS — Z23 Encounter for immunization: Secondary | ICD-10-CM | POA: Diagnosis not present

## 2015-02-28 ENCOUNTER — Encounter: Payer: Self-pay | Admitting: Internal Medicine

## 2015-02-28 ENCOUNTER — Ambulatory Visit (INDEPENDENT_AMBULATORY_CARE_PROVIDER_SITE_OTHER): Payer: PPO | Admitting: Internal Medicine

## 2015-02-28 ENCOUNTER — Ambulatory Visit (INDEPENDENT_AMBULATORY_CARE_PROVIDER_SITE_OTHER): Payer: PPO | Admitting: *Deleted

## 2015-02-28 VITALS — BP 112/70 | HR 86 | Ht 70.0 in | Wt 195.0 lb

## 2015-02-28 DIAGNOSIS — I4891 Unspecified atrial fibrillation: Secondary | ICD-10-CM

## 2015-02-28 DIAGNOSIS — I5022 Chronic systolic (congestive) heart failure: Secondary | ICD-10-CM | POA: Diagnosis not present

## 2015-02-28 DIAGNOSIS — Z95810 Presence of automatic (implantable) cardiac defibrillator: Secondary | ICD-10-CM

## 2015-02-28 DIAGNOSIS — I429 Cardiomyopathy, unspecified: Secondary | ICD-10-CM | POA: Diagnosis not present

## 2015-02-28 DIAGNOSIS — Z5181 Encounter for therapeutic drug level monitoring: Secondary | ICD-10-CM

## 2015-02-28 DIAGNOSIS — I482 Chronic atrial fibrillation, unspecified: Secondary | ICD-10-CM

## 2015-02-28 DIAGNOSIS — I10 Essential (primary) hypertension: Secondary | ICD-10-CM | POA: Diagnosis not present

## 2015-02-28 LAB — CUP PACEART INCLINIC DEVICE CHECK
Battery Remaining Longevity: 93.6
Brady Statistic RA Percent Paced: 0 %
Brady Statistic RV Percent Paced: 36 %
Date Time Interrogation Session: 20161031171025
HIGH POWER IMPEDANCE MEASURED VALUE: 63 Ohm
Implantable Lead Implant Date: 20100521
Implantable Lead Implant Date: 20160721
Implantable Lead Location: 753858
Implantable Lead Location: 753860
Lead Channel Impedance Value: 375 Ohm
Lead Channel Impedance Value: 537.5 Ohm
Lead Channel Impedance Value: 712.5 Ohm
Lead Channel Pacing Threshold Amplitude: 1.5 V
Lead Channel Pacing Threshold Amplitude: 1.5 V
Lead Channel Pacing Threshold Pulse Width: 0.5 ms
Lead Channel Pacing Threshold Pulse Width: 0.5 ms
Lead Channel Pacing Threshold Pulse Width: 0.5 ms
Lead Channel Sensing Intrinsic Amplitude: 5.5 mV
Lead Channel Setting Pacing Amplitude: 2.5 V
Lead Channel Setting Sensing Sensitivity: 0.5 mV
MDC IDC LEAD IMPLANT DT: 20160721
MDC IDC LEAD LOCATION: 753859
MDC IDC LEAD MODEL: 7122
MDC IDC MSMT LEADCHNL LV PACING THRESHOLD PULSEWIDTH: 0.5 ms
MDC IDC MSMT LEADCHNL RA SENSING INTR AMPL: 1.9 mV
MDC IDC MSMT LEADCHNL RV PACING THRESHOLD AMPLITUDE: 1 V
MDC IDC MSMT LEADCHNL RV PACING THRESHOLD AMPLITUDE: 1 V
MDC IDC SET LEADCHNL LV PACING PULSEWIDTH: 0.5 ms
MDC IDC SET LEADCHNL RV PACING AMPLITUDE: 2.5 V
MDC IDC SET LEADCHNL RV PACING PULSEWIDTH: 0.5 ms
Pulse Gen Serial Number: 7289863

## 2015-02-28 LAB — POCT INR: INR: 2.8

## 2015-02-28 NOTE — Assessment & Plan Note (Signed)
His blood pressure is well controlled. He will continue his current meds. 

## 2015-02-28 NOTE — Patient Instructions (Addendum)
Medication Instructions:  Your physician recommends that you continue on your current medications as directed. Please refer to the Current Medication list given to you today.   Labwork: None ordered   Testing/Procedures: None ordered   Follow-Up: Your physician wants you to follow-up in: 9 months with Dr Knox Saliva will receive a reminder letter in the mail two months in advance. If you don't receive a letter, please call our office to schedule the follow-up appointment.  Remote monitoring is used to monitor your  ICD from home. This monitoring reduces the number of office visits required to check your device to one time per year. It allows [place] to keep an eye on the functioning of your device to ensure it is working properly. You are scheduled for a device check from home on 05/30/15. You may send your transmission at any time that day. If you have a wireless device, the transmission will be sent automatically. After your physician reviews your transmission, you will receive a postcard with your next transmission date.     Any Other Special Instructions Will Be Listed Below (If Applicable).     If you need a refill on your cardiac medications before your next appointment, please call your pharmacy.

## 2015-02-28 NOTE — Assessment & Plan Note (Signed)
His St. Jude ICD is working normally. His LV threshold is up a bit.

## 2015-02-28 NOTE — Progress Notes (Signed)
HPI [name] returns today for followup. He is a pleasant 79 yo man with a h/o atrial fib, symptomatic bradycardia and LBBB. He is s/p biV PM and only paces 20% of the time. He has class 2 CHF symptoms. His EF was 15-20% initially then 25-30% by repeat echo. He has been on a beta blocker and ACE inhibitor and with initiation of lasix his dyspnea has resolved. He denies chest pain. He denies peripheral edema. Allergies  Allergen Reactions  . Zithromax [Azithromycin] Other (See Comments)    Affects Coumadin levels      Current Outpatient Prescriptions  Medication Sig Dispense Refill  . amoxicillin (AMOXIL) 500 MG tablet Take 2,000 mg by mouth as directed. Take before dental work    . calcipotriene (DOVONOX) 0.005 % cream Apply 1 application topically 2 (two) times daily as needed. rash    . carvedilol (COREG) 12.5 MG tablet Take 0.5 tablets (6.25 mg total) by mouth 2 (two) times daily with a meal. 90 tablet 3  . digoxin (LANOXIN) 0.125 MG tablet Take 1 tablet (0.125 mg total) by mouth daily. 30 tablet 5  . furosemide (LASIX) 40 MG tablet TAKE 1 TABLET (40 MG TOTAL) BY MOUTH DAILY. 30 tablet 3  . hydrocortisone cream 1 % Apply 1 application topically daily as needed for itching.     . latanoprost (XALATAN) 0.005 % ophthalmic solution Place 1 drop into both eyes at bedtime.    Marland Kitchen lisinopril (PRINIVIL,ZESTRIL) 10 MG tablet TAKE 1 TABLET (10 MG TOTAL) BY MOUTH DAILY. 90 tablet 3  . Lutein 20 MG CAPS Take 20 mg by mouth at bedtime.     . naproxen sodium (ANAPROX) 220 MG tablet Take 220 mg by mouth daily as needed (for pain).    . rosuvastatin (CRESTOR) 10 MG tablet Take 10 mg by mouth daily.    Marland Kitchen spironolactone (ALDACTONE) 25 MG tablet TAKE 1 TABLET (25 MG TOTAL) BY MOUTH DAILY. 90 tablet 1  . warfarin (COUMADIN) 5 MG tablet TAKE AS DIRECTED BY COUMADIN CLINIC (Patient taking differently: Take 5mg  by mouth daily except 7.5mg  by mouth on friday and monday.) 130 tablet 1   No current  facility-administered medications for this visit.     Past Medical History  Diagnosis Date  . Psoriatic arthritis ([place])   . GERD (gastroesophageal reflux disease)   . Hypertension   . Atrial fibrillation [facility])     sees Dr. Virl Axe  . Tachycardia-bradycardia syndrome ([place])   . Hyperlipidemia   . H/O hiatal hernia   . GI bleed     hx  . AICD (automatic cardioverter/defibrillator) present   . Complication of anesthesia 1976    BP elevated with spinal;  "I was still in the service when that happened"  . Bronchial pneumonia X 3 "when I was a kid"  . Arthritis     "all over"  . Anemia, iron deficiency     hx    ROS:   All systems reviewed and negative except as noted in the HPI.   Past Surgical History  Procedure Laterality Date  . Total hip arthroplasty Bilateral 1998 `2001    bilateral, sees Dr. Para March   . Appendectomy    . Hand surgery Left 1978    lft hand, fusions to DIPs and PIPs of fingers 1,2,4,and 5  . Colonoscopy  05-27-10    per Dr, Ardis Hughs, extensive diverticulosis, repeat in 5 yrs   . Esophagogastroduodenoscopy  05-27-10  per Dr. Ardis Hughs, normal   . Cardiac catheterization  09/10/08  . Pilonidal cyst excision  1961  . Insert / replace / remove pacemaker  2010  . Total hip revision  02/25/2012    Procedure: TOTAL HIP REVISION;  Surgeon: Kerin Salen, MD;  Location: [facility];  Service: Orthopedics;  Laterality: Left;  . Joint replacement    . Cardiac defibrillator placement  11/18/2014  . Ep implantable device N/A 11/18/2014    Procedure: BiV ICD Upgrade;  Surgeon: Evans Lance, MD;  Location: [facility];  Service: Cardiovascular;  Laterality: N/A;     Family History  Problem Relation Age of Onset  . Hypertension Other   . Heart disease Other      Social History   Social History  . Marital Status: Married    Spouse Name: N/A  . Number of Children: N/A  . Years of Education: N/A   Occupational History  . Not on file.   Social History  Main Topics  . Smoking status: Former Smoker -- 1.00 packs/day for 30 years    Types: Cigarettes, Pipe    Quit date: 02/17/1982  . Smokeless tobacco: Former Systems developer    Quit date: 05/01/1983  . Alcohol Use: 1.8 oz/week    1 Standard drinks or equivalent, 2 Shots of liquor per week  . Drug Use: No  . Sexual Activity: Yes   Other Topics Concern  . Not on file   Social History Narrative     BP 112/70 mmHg  Pulse 86  Ht 5\' 10"  (1.778 m)  Wt 195 lb (88.451 kg)  BMI 27.98 kg/m2  Physical Exam:  Well appearing 79 yo man, NAD HEENT: Unremarkable Neck:  7 cm JVD, no thyromegally Back:  No CVA tenderness Lungs:  Clear with basilar rales HEART:  Regular rate rhythm, no murmurs, no rubs, no clicks Abd:  soft, positive bowel sounds, no organomegally, no rebound, no guarding Ext:  2 plus pulses, no edema, no cyanosis, no clubbing Skin:  No rashes no nodules Neuro:  CN II through XII intact, motor grossly intact   DEVICE  Normal device function.  See PaceArt for details.   Assess/Plan:

## 2015-02-28 NOTE — Assessment & Plan Note (Signed)
His symptoms are class 2A. He will continue his current meds. I cautioned the patient to maintain a low sodium diet.

## 2015-02-28 NOTE — Assessment & Plan Note (Signed)
His ventricular rate is well controlled but faster than his pacing rate. He will continue his current meds. He will continue his blood thinner.

## 2015-03-14 ENCOUNTER — Encounter: Payer: Self-pay | Admitting: Family Medicine

## 2015-03-14 ENCOUNTER — Ambulatory Visit (INDEPENDENT_AMBULATORY_CARE_PROVIDER_SITE_OTHER): Payer: PPO | Admitting: Family Medicine

## 2015-03-14 VITALS — BP 103/62 | HR 76 | Temp 98.1°F | Ht 70.0 in | Wt 194.0 lb

## 2015-03-14 DIAGNOSIS — J209 Acute bronchitis, unspecified: Secondary | ICD-10-CM | POA: Diagnosis not present

## 2015-03-14 MED ORDER — AMOXICILLIN-POT CLAVULANATE 875-125 MG PO TABS: 1.0000 | ORAL_TABLET | Freq: Two times a day (BID) | ORAL | Status: DC

## 2015-03-14 NOTE — Progress Notes (Signed)
   Subjective:    Patient ID: [name], male    DOB: 04/06/1936, 79 y.o.   MRN: [id number]  HPI Here for 3 days of fever to 100.1 degrees, PND, chest congestion and coughing up yellow sputum.    Review of Systems  Constitutional: Positive for fever.  HENT: Positive for congestion and postnasal drip. Negative for ear pain.   Eyes: Negative.   Respiratory: Positive for cough and chest tightness.        Objective:   Physical Exam  Constitutional: He appears well-developed and well-nourished.  HENT:  Right Ear: External ear normal.  Left Ear: External ear normal.  Nose: Nose normal.  Mouth/Throat: Oropharynx is clear and moist.  Eyes: Conjunctivae are normal.  Cardiovascular: Normal rate, regular rhythm, normal heart sounds and intact distal pulses.   Pulmonary/Chest: Effort normal. No respiratory distress. He has no wheezes. He has no rales.  Scattered rhonchi  Lymphadenopathy:    He has no cervical adenopathy.          Assessment & Plan:  Bronchitis, treat with Augmentin and Mucinex

## 2015-03-14 NOTE — Progress Notes (Signed)
Pre visit review using our clinic review tool, if applicable. No additional management support is needed unless otherwise documented below in the visit note. 

## 2015-04-01 ENCOUNTER — Encounter: Payer: Self-pay | Admitting: Family Medicine

## 2015-04-01 ENCOUNTER — Ambulatory Visit (INDEPENDENT_AMBULATORY_CARE_PROVIDER_SITE_OTHER): Payer: PPO | Admitting: Family Medicine

## 2015-04-01 VITALS — BP 100/62 | HR 80 | Temp 97.9°F | Ht 70.0 in | Wt 188.0 lb

## 2015-04-01 DIAGNOSIS — R197 Diarrhea, unspecified: Secondary | ICD-10-CM | POA: Diagnosis not present

## 2015-04-01 NOTE — Progress Notes (Signed)
   Subjective:    Patient ID: Andrew Vance, male    DOB: [birth year], 79 y.o.   MRN: [id number]  HPI Here for 6 days of diarrhea that has actually improved today. Of note he was seen here on 03-14-15 for a bronchitis and was treated with Augmentin. The bronchitis has resolved, but 6 days ago he developed some mild cramps and light diarrhea. No fever or nausea. He is drinking plenty of water. He tried Kaopectate with no response. His appetite is good. So far today he has had no cramps and no diarrhea at all.    Review of Systems  Constitutional: Negative.   Respiratory: Negative.   Cardiovascular: Negative.   Gastrointestinal: Positive for diarrhea. Negative for nausea, vomiting, abdominal pain, constipation, blood in stool, abdominal distention and anal bleeding.  Genitourinary: Negative.        Objective:   Physical Exam  Constitutional: He appears well-developed and well-nourished.  Cardiovascular: Normal rate, regular rhythm, normal heart sounds and intact distal pulses.   Pulmonary/Chest: Effort normal and breath sounds normal.  Abdominal: Soft. Bowel sounds are normal. He exhibits no distension and no mass. There is no tenderness. There is no rebound and no guarding.          Assessment & Plan:  This seems to be functional diarrhea as a result of antibiotic toxicity. A C diff infection is possible but less likely. He seems to be much better today, so we will observe. He will drink fluids and recheck prn

## 2015-04-01 NOTE — Progress Notes (Signed)
Pre visit review using our clinic review tool, if applicable. No additional management support is needed unless otherwise documented below in the visit note. 

## 2015-04-05 ENCOUNTER — Other Ambulatory Visit: Payer: Self-pay | Admitting: Internal Medicine

## 2015-04-12 ENCOUNTER — Encounter: Payer: Self-pay | Admitting: Family Medicine

## 2015-04-12 ENCOUNTER — Ambulatory Visit (INDEPENDENT_AMBULATORY_CARE_PROVIDER_SITE_OTHER): Payer: PPO | Admitting: Family Medicine

## 2015-04-12 VITALS — BP 97/73 | HR 90 | Temp 98.6°F

## 2015-04-12 DIAGNOSIS — M1990 Unspecified osteoarthritis, unspecified site: Secondary | ICD-10-CM | POA: Diagnosis not present

## 2015-04-12 MED ORDER — TRAMADOL HCL 50 MG PO TABS: 100.0000 mg | ORAL_TABLET | Freq: Four times a day (QID) | ORAL | Status: DC | PRN

## 2015-04-12 MED ORDER — PREDNISONE 10 MG PO TABS
ORAL_TABLET | ORAL | Status: DC
Start: 2015-04-12 — End: 2015-09-20

## 2015-04-12 MED ORDER — METHYLPREDNISOLONE ACETATE 80 MG/ML IJ SUSP
160.0000 mg | Freq: Once | INTRAMUSCULAR | Status: AC
  Administered 2015-04-12: 160 mg via INTRAMUSCULAR

## 2015-04-12 NOTE — Progress Notes (Signed)
Pre visit review using our clinic review tool, if applicable. No additional management support is needed unless otherwise documented below in the visit note. Pt unable to weigh 

## 2015-04-12 NOTE — Addendum Note (Signed)
Addended by: Aggie Hacker A on: 04/12/2015 04:51 PM   Modules accepted: Orders

## 2015-04-12 NOTE — Progress Notes (Signed)
   Subjective:    Patient ID: Andrew Vance, male    DOB: [birth year], 79 y.o.   MRN: [id number]  HPI Here with wife and daughter for diffuse swelling and pain in many of his joints, worse in the hands, wrists, and knees. He has known psoriatic arthritis, and there was a question of possible gout many years ago but this was never established. These pains started almost over night last week just as he was recovering from a bout of diarrhea. He has tried Aleve with no benefit.   Review of Systems  Constitutional: Negative.   Respiratory: Negative.   Cardiovascular: Negative.   Gastrointestinal: Negative.   Musculoskeletal: Positive for joint swelling, arthralgias and gait problem.       Objective:   Physical Exam  Constitutional: He is oriented to person, place, and time.  In pain, in a wheelchair   Cardiovascular: Normal rate, regular rhythm, normal heart sounds and intact distal pulses.   Pulmonary/Chest: Effort normal and breath sounds normal.  Musculoskeletal:  His hands, wrists, and knees are all swollen, warm, and exquisitely tender. No erythema is visible  Neurological: He is alert and oriented to person, place, and time.          Assessment & Plan:  Acute onset of a polyarthritis, likely gout. givn a shot of steroids to be followed by a prednisone taper. Add tramadol for pain. Get labs today including a uric acid level.

## 2015-04-13 LAB — RHEUMATOID FACTOR: RHEUMATOID FACTOR: 14 [IU]/mL (ref <=14)

## 2015-04-13 LAB — CBC WITH DIFFERENTIAL/PLATELET
BASOS PCT: 0.4 % (ref 0.0–3.0)
Basophils Absolute: 0 10*3/uL (ref 0.0–0.1)
EOS ABS: 0.1 10*3/uL (ref 0.0–0.7)
EOS PCT: 0.7 % (ref 0.0–5.0)
HEMATOCRIT: 35.2 % — AB (ref 39.0–52.0)
HEMOGLOBIN: 11.4 g/dL — AB (ref 13.0–17.0)
Lymphs Abs: 0.7 10*3/uL (ref 0.7–4.0)
MCHC: 32.5 g/dL (ref 30.0–36.0)
MCV: 95.8 fl (ref 78.0–100.0)
MONOS PCT: 8.6 % (ref 3.0–12.0)
Monocytes Absolute: 0.8 10*3/uL (ref 0.1–1.0)
Neutro Abs: 8.1 10*3/uL — ABNORMAL HIGH (ref 1.4–7.7)
Neutrophils Relative %: 82.7 % — ABNORMAL HIGH (ref 43.0–77.0)
Platelets: 285 10*3/uL (ref 150.0–400.0)
RBC: 3.68 Mil/uL — ABNORMAL LOW (ref 4.22–5.81)
RDW: 13.7 % (ref 11.5–15.5)
WBC: 9.8 10*3/uL (ref 4.0–10.5)

## 2015-04-13 LAB — SEDIMENTATION RATE: Sed Rate: 103 mm/hr — ABNORMAL HIGH (ref 0–22)

## 2015-04-13 LAB — URIC ACID: URIC ACID, SERUM: 8.1 mg/dL — AB (ref 4.0–7.8)

## 2015-04-13 LAB — C-REACTIVE PROTEIN: CRP: 13.7 mg/dL (ref 0.5–20.0)

## 2015-04-17 ENCOUNTER — Encounter: Payer: Self-pay | Admitting: Family Medicine

## 2015-04-18 ENCOUNTER — Ambulatory Visit (INDEPENDENT_AMBULATORY_CARE_PROVIDER_SITE_OTHER): Payer: PPO | Admitting: *Deleted

## 2015-04-18 DIAGNOSIS — I482 Chronic atrial fibrillation, unspecified: Secondary | ICD-10-CM

## 2015-04-18 DIAGNOSIS — I4891 Unspecified atrial fibrillation: Secondary | ICD-10-CM | POA: Diagnosis not present

## 2015-04-18 DIAGNOSIS — Z5181 Encounter for therapeutic drug level monitoring: Secondary | ICD-10-CM

## 2015-04-18 LAB — POCT INR: INR: 4.3

## 2015-04-27 ENCOUNTER — Ambulatory Visit (INDEPENDENT_AMBULATORY_CARE_PROVIDER_SITE_OTHER): Payer: PPO | Admitting: Pharmacist

## 2015-04-27 DIAGNOSIS — I4891 Unspecified atrial fibrillation: Secondary | ICD-10-CM | POA: Diagnosis not present

## 2015-04-27 DIAGNOSIS — I482 Chronic atrial fibrillation, unspecified: Secondary | ICD-10-CM

## 2015-04-27 DIAGNOSIS — Z5181 Encounter for therapeutic drug level monitoring: Secondary | ICD-10-CM

## 2015-04-27 LAB — POCT INR: INR: 2.6

## 2015-04-29 ENCOUNTER — Ambulatory Visit (INDEPENDENT_AMBULATORY_CARE_PROVIDER_SITE_OTHER): Payer: PPO | Admitting: Family Medicine

## 2015-04-29 ENCOUNTER — Encounter: Payer: Self-pay | Admitting: Family Medicine

## 2015-04-29 VITALS — BP 103/72 | HR 78 | Temp 97.8°F

## 2015-04-29 DIAGNOSIS — L4050 Arthropathic psoriasis, unspecified: Secondary | ICD-10-CM

## 2015-04-29 DIAGNOSIS — M109 Gout, unspecified: Secondary | ICD-10-CM

## 2015-04-29 MED ORDER — HYDROCODONE-ACETAMINOPHEN 5-325 MG PO TABS: 1.0000 | ORAL_TABLET | Freq: Four times a day (QID) | ORAL | Status: DC | PRN

## 2015-04-29 MED ORDER — ALLOPURINOL 300 MG PO TABS: 300.0000 mg | ORAL_TABLET | Freq: Every day | ORAL | Status: DC

## 2015-04-29 NOTE — Progress Notes (Signed)
   Subjective:    Patient ID: Andrew Vance, male    DOB: 07/15/1935, 79 y.o.   MRN: [id number]  HPI Here to follow up on severe joint pains. He was seen here a few weeks ago and was given a steroid shot that was followed by a prednisone taper. He immediately felt better the day after the shot and he felt good for the first week. Then as the taper ended the pains returned. Today he has the severe pains again, and they do not respond to Tramadol at all. His labs revealed an elevated uric acid and a very elevated ESR. His RF was negative. He has a hx of psoriatic arthritis that bothered him 20 years ago.    Review of Systems  Constitutional: Negative.   Respiratory: Negative.   Cardiovascular: Negative.   Musculoskeletal: Positive for arthralgias and gait problem.       Objective:   Physical Exam  Constitutional: He appears well-developed and well-nourished.  Using a walker, in pain   Musculoskeletal:  Multiple swollen tender joints on the hands           Assessment & Plan:  I suspect he is having a flare of the psoriatic arthritis, given the high ESR and his rapid response to the steroids. I will refer him to Rheumatology for further evaluation. Start on Allopurinol for the gout. He will try Norco for pain control.

## 2015-04-29 NOTE — Progress Notes (Signed)
Pre visit review using our clinic review tool, if applicable. No additional management support is needed unless otherwise documented below in the visit note. Pt unable to weigh 

## 2015-05-01 ENCOUNTER — Other Ambulatory Visit: Payer: Self-pay | Admitting: Family Medicine

## 2015-05-09 DIAGNOSIS — Z79899 Other long term (current) drug therapy: Secondary | ICD-10-CM | POA: Diagnosis not present

## 2015-05-09 DIAGNOSIS — M25432 Effusion, left wrist: Secondary | ICD-10-CM | POA: Diagnosis not present

## 2015-05-09 DIAGNOSIS — L4050 Arthropathic psoriasis, unspecified: Secondary | ICD-10-CM | POA: Diagnosis not present

## 2015-05-09 DIAGNOSIS — M25461 Effusion, right knee: Secondary | ICD-10-CM | POA: Diagnosis not present

## 2015-05-09 DIAGNOSIS — M79641 Pain in right hand: Secondary | ICD-10-CM | POA: Diagnosis not present

## 2015-05-09 DIAGNOSIS — M25462 Effusion, left knee: Secondary | ICD-10-CM | POA: Diagnosis not present

## 2015-05-09 DIAGNOSIS — M79671 Pain in right foot: Secondary | ICD-10-CM | POA: Diagnosis not present

## 2015-05-09 DIAGNOSIS — M79672 Pain in left foot: Secondary | ICD-10-CM | POA: Diagnosis not present

## 2015-05-09 DIAGNOSIS — M79642 Pain in left hand: Secondary | ICD-10-CM | POA: Diagnosis not present

## 2015-05-10 ENCOUNTER — Other Ambulatory Visit: Payer: Self-pay | Admitting: Cardiology

## 2015-05-10 NOTE — Telephone Encounter (Signed)
Rx request sent to pharmacy.  

## 2015-05-25 ENCOUNTER — Ambulatory Visit (INDEPENDENT_AMBULATORY_CARE_PROVIDER_SITE_OTHER): Payer: PPO | Admitting: *Deleted

## 2015-05-25 DIAGNOSIS — Z5181 Encounter for therapeutic drug level monitoring: Secondary | ICD-10-CM

## 2015-05-25 DIAGNOSIS — I4891 Unspecified atrial fibrillation: Secondary | ICD-10-CM

## 2015-05-25 DIAGNOSIS — I482 Chronic atrial fibrillation, unspecified: Secondary | ICD-10-CM

## 2015-05-25 LAB — POCT INR: INR: 4.1

## 2015-05-30 ENCOUNTER — Telehealth: Payer: Self-pay | Admitting: Cardiology

## 2015-05-30 ENCOUNTER — Ambulatory Visit (INDEPENDENT_AMBULATORY_CARE_PROVIDER_SITE_OTHER): Payer: PPO | Admitting: *Deleted

## 2015-05-30 DIAGNOSIS — I5022 Chronic systolic (congestive) heart failure: Secondary | ICD-10-CM | POA: Diagnosis not present

## 2015-05-30 DIAGNOSIS — I429 Cardiomyopathy, unspecified: Secondary | ICD-10-CM

## 2015-05-30 NOTE — Telephone Encounter (Signed)
LMOVM reminding pt to send remote transmission.   

## 2015-05-31 NOTE — Progress Notes (Signed)
Remote ICD transmission.   

## 2015-06-07 DIAGNOSIS — Z79899 Other long term (current) drug therapy: Secondary | ICD-10-CM | POA: Diagnosis not present

## 2015-06-07 DIAGNOSIS — M109 Gout, unspecified: Secondary | ICD-10-CM | POA: Diagnosis not present

## 2015-06-07 DIAGNOSIS — L4050 Arthropathic psoriasis, unspecified: Secondary | ICD-10-CM | POA: Diagnosis not present

## 2015-06-07 DIAGNOSIS — M25432 Effusion, left wrist: Secondary | ICD-10-CM | POA: Diagnosis not present

## 2015-06-07 DIAGNOSIS — M25462 Effusion, left knee: Secondary | ICD-10-CM | POA: Diagnosis not present

## 2015-06-08 ENCOUNTER — Ambulatory Visit (INDEPENDENT_AMBULATORY_CARE_PROVIDER_SITE_OTHER): Payer: PPO | Admitting: *Deleted

## 2015-06-08 DIAGNOSIS — Z5181 Encounter for therapeutic drug level monitoring: Secondary | ICD-10-CM

## 2015-06-08 DIAGNOSIS — I4891 Unspecified atrial fibrillation: Secondary | ICD-10-CM | POA: Diagnosis not present

## 2015-06-08 DIAGNOSIS — I482 Chronic atrial fibrillation, unspecified: Secondary | ICD-10-CM

## 2015-06-08 LAB — POCT INR: INR: 2.7

## 2015-06-13 DIAGNOSIS — M9905 Segmental and somatic dysfunction of pelvic region: Secondary | ICD-10-CM | POA: Diagnosis not present

## 2015-06-13 DIAGNOSIS — H353132 Nonexudative age-related macular degeneration, bilateral, intermediate dry stage: Secondary | ICD-10-CM | POA: Diagnosis not present

## 2015-06-13 DIAGNOSIS — M5136 Other intervertebral disc degeneration, lumbar region: Secondary | ICD-10-CM | POA: Diagnosis not present

## 2015-06-13 DIAGNOSIS — H2513 Age-related nuclear cataract, bilateral: Secondary | ICD-10-CM | POA: Diagnosis not present

## 2015-06-13 DIAGNOSIS — M9903 Segmental and somatic dysfunction of lumbar region: Secondary | ICD-10-CM | POA: Diagnosis not present

## 2015-06-13 DIAGNOSIS — M9904 Segmental and somatic dysfunction of sacral region: Secondary | ICD-10-CM | POA: Diagnosis not present

## 2015-06-13 DIAGNOSIS — H401132 Primary open-angle glaucoma, bilateral, moderate stage: Secondary | ICD-10-CM | POA: Diagnosis not present

## 2015-06-15 DIAGNOSIS — M5136 Other intervertebral disc degeneration, lumbar region: Secondary | ICD-10-CM | POA: Diagnosis not present

## 2015-06-15 DIAGNOSIS — M9905 Segmental and somatic dysfunction of pelvic region: Secondary | ICD-10-CM | POA: Diagnosis not present

## 2015-06-15 DIAGNOSIS — M9904 Segmental and somatic dysfunction of sacral region: Secondary | ICD-10-CM | POA: Diagnosis not present

## 2015-06-15 DIAGNOSIS — M9903 Segmental and somatic dysfunction of lumbar region: Secondary | ICD-10-CM | POA: Diagnosis not present

## 2015-06-18 LAB — CUP PACEART REMOTE DEVICE CHECK
Battery Remaining Percentage: 92 %
HIGH POWER IMPEDANCE MEASURED VALUE: 71 Ohm
HIGH POWER IMPEDANCE MEASURED VALUE: 71 Ohm
Implantable Lead Implant Date: 20160721
Implantable Lead Location: 753858
Lead Channel Impedance Value: 460 Ohm
Lead Channel Impedance Value: 640 Ohm
Lead Channel Impedance Value: 700 Ohm
Lead Channel Pacing Threshold Pulse Width: 0.5 ms
Lead Channel Sensing Intrinsic Amplitude: 1.9 mV
Lead Channel Sensing Intrinsic Amplitude: 7.6 mV
Lead Channel Setting Pacing Amplitude: 2.5 V
Lead Channel Setting Pacing Amplitude: 2.5 V
Lead Channel Setting Pacing Pulse Width: 0.5 ms
Lead Channel Setting Pacing Pulse Width: 0.5 ms
MDC IDC LEAD IMPLANT DT: 20100521
MDC IDC LEAD IMPLANT DT: 20160721
MDC IDC LEAD LOCATION: 753859
MDC IDC LEAD LOCATION: 753860
MDC IDC LEAD MODEL: 7122
MDC IDC MSMT BATTERY REMAINING LONGEVITY: 88 mo
MDC IDC MSMT BATTERY VOLTAGE: 3.17 V
MDC IDC MSMT LEADCHNL LV PACING THRESHOLD AMPLITUDE: 1.5 V
MDC IDC MSMT LEADCHNL LV PACING THRESHOLD PULSEWIDTH: 0.5 ms
MDC IDC MSMT LEADCHNL RV PACING THRESHOLD AMPLITUDE: 1 V
MDC IDC PG SERIAL: 7289863
MDC IDC SESS DTM: 20170130211045
MDC IDC SET LEADCHNL RV SENSING SENSITIVITY: 0.5 mV

## 2015-06-20 ENCOUNTER — Ambulatory Visit (INDEPENDENT_AMBULATORY_CARE_PROVIDER_SITE_OTHER): Payer: PPO | Admitting: Family Medicine

## 2015-06-20 ENCOUNTER — Encounter: Payer: Self-pay | Admitting: Family Medicine

## 2015-06-20 VITALS — BP 97/61 | HR 94 | Temp 98.3°F | Ht 70.0 in | Wt 184.0 lb

## 2015-06-20 DIAGNOSIS — I10 Essential (primary) hypertension: Secondary | ICD-10-CM

## 2015-06-20 DIAGNOSIS — I5022 Chronic systolic (congestive) heart failure: Secondary | ICD-10-CM

## 2015-06-20 DIAGNOSIS — J069 Acute upper respiratory infection, unspecified: Secondary | ICD-10-CM | POA: Diagnosis not present

## 2015-06-20 DIAGNOSIS — I482 Chronic atrial fibrillation, unspecified: Secondary | ICD-10-CM

## 2015-06-20 MED ORDER — HYDROCODONE-HOMATROPINE 5-1.5 MG/5ML PO SYRP: 5.0000 mL | ORAL_SOLUTION | ORAL | Status: DC | PRN

## 2015-06-20 MED ORDER — CARVEDILOL 3.125 MG PO TABS: 3.1250 mg | ORAL_TABLET | Freq: Two times a day (BID) | ORAL | Status: DC

## 2015-06-20 NOTE — Progress Notes (Signed)
   Subjective:    Patient ID: Andrew Vance, male    DOB: 03-27-1936, 80 y.o.   MRN: [id number]  HPI Here for several issues. First he has been coughing for the past week. He does not feel sick as such, but he has had some PND and a non-productive cough. No fever. No SOB. Also he is concerned about low BP readings. He had been taking Carvedilol 12.5 mg bid and Lisinopril 10 mg daily per Dr. Lovena Le to control HTN and for rate control, since he has had atrial fibrillation. This was successful but he started feeling a bit weak and lightheaded about a month ago. He found his BP to be low at home in the range of 123XX123 and 90 systolic. No chest pain or SOB. Per Dr. Tanna Furry advice he began to take 1/2 tab of the Carvedilol (effectively 6.25 mg) bid for a few weeks, but his pressures still ran low. Now for the past 2 weeks he has stopped taking the Carvedilol completely and is taking only the Linisopril. His BP has been 79-114 over 49-76 during this time, and his pulse rate as been in the 80s or 90s.    Review of Systems  Constitutional: Negative.   HENT: Positive for congestion and postnasal drip. Negative for sinus pressure and sore throat.   Eyes: Negative.   Respiratory: Positive for cough. Negative for choking, chest tightness, shortness of breath, wheezing and stridor.   Cardiovascular: Negative for chest pain, palpitations and leg swelling.  Neurological: Positive for light-headedness. Negative for dizziness, tremors, seizures, syncope, facial asymmetry, speech difficulty, weakness, numbness and headaches.       Objective:   Physical Exam  Constitutional: He is oriented to person, place, and time. He appears well-developed. No distress.  HENT:  Right Ear: External ear normal.  Left Ear: External ear normal.  Nose: Nose normal.  Mouth/Throat: Oropharynx is clear and moist.  Eyes: Conjunctivae are normal.  Neck: No thyromegaly present.  Cardiovascular: Normal rate, normal heart sounds and  intact distal pulses.   No murmur heard. Occasional ectopy   Pulmonary/Chest: Effort normal and breath sounds normal. No respiratory distress. He has no wheezes. He has no rales.  Musculoskeletal: He exhibits no edema.  Lymphadenopathy:    He has no cervical adenopathy.  Neurological: He is alert and oriented to person, place, and time.          Assessment & Plan:  He has a viral URI and it should resolve in the next few days. Drink fluids, take Mucinex. Given Hydromet for cough. His BP has been consistently too low so we will get back on Carvedilol but reduce the dose to 3.125 mg bid. Stop the Lisinopril completely. He will follow his BP and heart rates closely for the next 3-4 weeks and then report back to [place].

## 2015-06-20 NOTE — Progress Notes (Signed)
Pre visit review using our clinic review tool, if applicable. No additional management support is needed unless otherwise documented below in the visit note. 

## 2015-06-24 ENCOUNTER — Encounter: Payer: Self-pay | Admitting: Cardiology

## 2015-06-27 ENCOUNTER — Encounter: Payer: Self-pay | Admitting: Gastroenterology

## 2015-06-29 ENCOUNTER — Ambulatory Visit (INDEPENDENT_AMBULATORY_CARE_PROVIDER_SITE_OTHER): Payer: PPO | Admitting: *Deleted

## 2015-06-29 DIAGNOSIS — Z5181 Encounter for therapeutic drug level monitoring: Secondary | ICD-10-CM | POA: Diagnosis not present

## 2015-06-29 DIAGNOSIS — I482 Chronic atrial fibrillation, unspecified: Secondary | ICD-10-CM

## 2015-06-29 DIAGNOSIS — I4891 Unspecified atrial fibrillation: Secondary | ICD-10-CM

## 2015-06-29 LAB — POCT INR: INR: 2

## 2015-07-06 ENCOUNTER — Other Ambulatory Visit: Payer: Self-pay | Admitting: Internal Medicine

## 2015-07-06 DIAGNOSIS — Z79899 Other long term (current) drug therapy: Secondary | ICD-10-CM | POA: Diagnosis not present

## 2015-07-06 DIAGNOSIS — L4050 Arthropathic psoriasis, unspecified: Secondary | ICD-10-CM | POA: Diagnosis not present

## 2015-07-06 DIAGNOSIS — M25462 Effusion, left knee: Secondary | ICD-10-CM | POA: Diagnosis not present

## 2015-07-13 ENCOUNTER — Encounter: Payer: Self-pay | Admitting: Family Medicine

## 2015-07-13 DIAGNOSIS — M5136 Other intervertebral disc degeneration, lumbar region: Secondary | ICD-10-CM | POA: Diagnosis not present

## 2015-07-13 DIAGNOSIS — M9903 Segmental and somatic dysfunction of lumbar region: Secondary | ICD-10-CM | POA: Diagnosis not present

## 2015-07-13 DIAGNOSIS — M9905 Segmental and somatic dysfunction of pelvic region: Secondary | ICD-10-CM | POA: Diagnosis not present

## 2015-07-13 DIAGNOSIS — M9904 Segmental and somatic dysfunction of sacral region: Secondary | ICD-10-CM | POA: Diagnosis not present

## 2015-07-19 DIAGNOSIS — Z85828 Personal history of other malignant neoplasm of skin: Secondary | ICD-10-CM | POA: Diagnosis not present

## 2015-07-19 DIAGNOSIS — D485 Neoplasm of uncertain behavior of skin: Secondary | ICD-10-CM | POA: Diagnosis not present

## 2015-07-19 DIAGNOSIS — L821 Other seborrheic keratosis: Secondary | ICD-10-CM | POA: Diagnosis not present

## 2015-07-19 DIAGNOSIS — D692 Other nonthrombocytopenic purpura: Secondary | ICD-10-CM | POA: Diagnosis not present

## 2015-07-19 DIAGNOSIS — D2339 Other benign neoplasm of skin of other parts of face: Secondary | ICD-10-CM | POA: Diagnosis not present

## 2015-07-19 DIAGNOSIS — L988 Other specified disorders of the skin and subcutaneous tissue: Secondary | ICD-10-CM | POA: Diagnosis not present

## 2015-07-19 DIAGNOSIS — L812 Freckles: Secondary | ICD-10-CM | POA: Diagnosis not present

## 2015-07-19 DIAGNOSIS — D1801 Hemangioma of skin and subcutaneous tissue: Secondary | ICD-10-CM | POA: Diagnosis not present

## 2015-07-19 DIAGNOSIS — L738 Other specified follicular disorders: Secondary | ICD-10-CM | POA: Diagnosis not present

## 2015-07-19 DIAGNOSIS — L570 Actinic keratosis: Secondary | ICD-10-CM | POA: Diagnosis not present

## 2015-07-24 ENCOUNTER — Other Ambulatory Visit: Payer: Self-pay | Admitting: Internal Medicine

## 2015-07-27 ENCOUNTER — Ambulatory Visit (INDEPENDENT_AMBULATORY_CARE_PROVIDER_SITE_OTHER): Payer: PPO | Admitting: *Deleted

## 2015-07-27 DIAGNOSIS — I482 Chronic atrial fibrillation, unspecified: Secondary | ICD-10-CM

## 2015-07-27 DIAGNOSIS — I4891 Unspecified atrial fibrillation: Secondary | ICD-10-CM

## 2015-07-27 DIAGNOSIS — Z5181 Encounter for therapeutic drug level monitoring: Secondary | ICD-10-CM | POA: Diagnosis not present

## 2015-07-27 LAB — POCT INR: INR: 2.8

## 2015-08-10 DIAGNOSIS — M9905 Segmental and somatic dysfunction of pelvic region: Secondary | ICD-10-CM | POA: Diagnosis not present

## 2015-08-10 DIAGNOSIS — M5136 Other intervertebral disc degeneration, lumbar region: Secondary | ICD-10-CM | POA: Diagnosis not present

## 2015-08-10 DIAGNOSIS — M9903 Segmental and somatic dysfunction of lumbar region: Secondary | ICD-10-CM | POA: Diagnosis not present

## 2015-08-10 DIAGNOSIS — M9904 Segmental and somatic dysfunction of sacral region: Secondary | ICD-10-CM | POA: Diagnosis not present

## 2015-08-16 DIAGNOSIS — Z79899 Other long term (current) drug therapy: Secondary | ICD-10-CM | POA: Diagnosis not present

## 2015-08-16 DIAGNOSIS — M25561 Pain in right knee: Secondary | ICD-10-CM | POA: Diagnosis not present

## 2015-08-16 DIAGNOSIS — M25474 Effusion, right foot: Secondary | ICD-10-CM | POA: Diagnosis not present

## 2015-08-16 DIAGNOSIS — M25562 Pain in left knee: Secondary | ICD-10-CM | POA: Diagnosis not present

## 2015-08-16 DIAGNOSIS — M25475 Effusion, left foot: Secondary | ICD-10-CM | POA: Diagnosis not present

## 2015-08-16 DIAGNOSIS — L4050 Arthropathic psoriasis, unspecified: Secondary | ICD-10-CM | POA: Diagnosis not present

## 2015-08-21 ENCOUNTER — Other Ambulatory Visit: Payer: Self-pay | Admitting: Family Medicine

## 2015-08-23 ENCOUNTER — Encounter: Payer: Self-pay | Admitting: Family Medicine

## 2015-08-23 ENCOUNTER — Ambulatory Visit (INDEPENDENT_AMBULATORY_CARE_PROVIDER_SITE_OTHER): Payer: PPO | Admitting: Family Medicine

## 2015-08-23 VITALS — BP 111/72 | HR 71 | Temp 98.2°F | Ht 70.0 in | Wt 186.0 lb

## 2015-08-23 DIAGNOSIS — Z0000 Encounter for general adult medical examination without abnormal findings: Secondary | ICD-10-CM

## 2015-08-23 DIAGNOSIS — M109 Gout, unspecified: Secondary | ICD-10-CM | POA: Diagnosis not present

## 2015-08-23 LAB — LIPID PANEL
CHOL/HDL RATIO: 3
Cholesterol: 142 mg/dL (ref 0–200)
HDL: 54.8 mg/dL (ref >39.00)
LDL CALC: 50 mg/dL (ref 0–99)
NONHDL: 87.47
Triglycerides: 186 mg/dL — ABNORMAL HIGH (ref 0.0–149.0)
VLDL: 37.2 mg/dL (ref 0.0–40.0)

## 2015-08-23 LAB — POC URINALSYSI DIPSTICK (AUTOMATED)
BILIRUBIN UA: NEGATIVE
GLUCOSE UA: NEGATIVE
Ketones, UA: NEGATIVE
Leukocytes, UA: NEGATIVE
NITRITE UA: NEGATIVE
Protein, UA: NEGATIVE
Spec Grav, UA: 1.01
Urobilinogen, UA: 0.2
pH, UA: 6

## 2015-08-23 LAB — CBC WITH DIFFERENTIAL/PLATELET
BASOS PCT: 0.4 % (ref 0.0–3.0)
Basophils Absolute: 0.1 10*3/uL (ref 0.0–0.1)
EOS PCT: 1.2 % (ref 0.0–5.0)
Eosinophils Absolute: 0.2 10*3/uL (ref 0.0–0.7)
HEMATOCRIT: 49 % (ref 39.0–52.0)
HEMOGLOBIN: 16 g/dL (ref 13.0–17.0)
LYMPHS PCT: 14.7 % (ref 12.0–46.0)
Lymphs Abs: 2.1 10*3/uL (ref 0.7–4.0)
MCHC: 32.6 g/dL (ref 30.0–36.0)
MCV: 96.6 fl (ref 78.0–100.0)
MONOS PCT: 9.5 % (ref 3.0–12.0)
Monocytes Absolute: 1.3 10*3/uL — ABNORMAL HIGH (ref 0.1–1.0)
NEUTROS ABS: 10.4 10*3/uL — AB (ref 1.4–7.7)
Neutrophils Relative %: 74.2 % (ref 43.0–77.0)
Platelets: 186 10*3/uL (ref 150.0–400.0)
RBC: 5.08 Mil/uL (ref 4.22–5.81)
RDW: 18.1 % — AB (ref 11.5–15.5)
WBC: 14.1 10*3/uL — AB (ref 4.0–10.5)

## 2015-08-23 LAB — BASIC METABOLIC PANEL
BUN: 22 mg/dL (ref 6–23)
CALCIUM: 9.9 mg/dL (ref 8.4–10.5)
CHLORIDE: 102 meq/L (ref 96–112)
CO2: 28 mEq/L (ref 19–32)
CREATININE: 1.17 mg/dL (ref 0.40–1.50)
GFR: 64.16 mL/min (ref >60.00)
Glucose, Bld: 83 mg/dL (ref 70–99)
Potassium: 4 mEq/L (ref 3.5–5.1)
Sodium: 139 mEq/L (ref 135–145)

## 2015-08-23 LAB — URIC ACID: Uric Acid, Serum: 5.4 mg/dL (ref 4.0–7.8)

## 2015-08-23 LAB — TSH: TSH: 2.79 u[IU]/mL (ref 0.35–4.50)

## 2015-08-23 LAB — HEPATIC FUNCTION PANEL
ALT: 19 U/L (ref 0–53)
AST: 21 U/L (ref 0–37)
Albumin: 4.4 g/dL (ref 3.5–5.2)
Alkaline Phosphatase: 62 U/L (ref 39–117)
BILIRUBIN TOTAL: 0.9 mg/dL (ref 0.2–1.2)
Bilirubin, Direct: 0.2 mg/dL (ref 0.0–0.3)
Total Protein: 7 g/dL (ref 6.0–8.3)

## 2015-08-23 LAB — PSA: PSA: 2.61 ng/mL (ref 0.10–4.00)

## 2015-08-23 NOTE — Progress Notes (Signed)
Pre visit review using our clinic review tool, if applicable. No additional management support is needed unless otherwise documented below in the visit note. 

## 2015-08-23 NOTE — Progress Notes (Signed)
   Subjective:    Patient ID: Andrew Vance, male    DOB: 1935/05/23, 80 y.o.   MRN: [id number]  HPI 80 yr old male for a well exam. He feels great now except for his joint pains. We have been adjusting his BP meds, and his current regimen seems to be perfect for him. His BP at home is running in the 99991111 range systolic with pulses in the 70-80 range.    Review of Systems  Constitutional: Negative.   HENT: Negative.   Eyes: Negative.   Respiratory: Negative.   Cardiovascular: Negative.   Gastrointestinal: Negative.   Genitourinary: Negative.   Musculoskeletal: Positive for back pain, joint swelling, arthralgias and gait problem. Negative for myalgias, neck pain and neck stiffness.  Skin: Negative.   Neurological: Negative.   Psychiatric/Behavioral: Negative.        Objective:   Physical Exam  Constitutional: He is oriented to person, place, and time. He appears well-developed and well-nourished. No distress.  HENT:  Head: Normocephalic and atraumatic.  Right Ear: External ear normal.  Left Ear: External ear normal.  Nose: Nose normal.  Mouth/Throat: Oropharynx is clear and moist. No oropharyngeal exudate.  Eyes: Conjunctivae and EOM are normal. Pupils are equal, round, and reactive to light. Right eye exhibits no discharge. Left eye exhibits no discharge. No scleral icterus.  Neck: Neck supple. No JVD present. No tracheal deviation present. No thyromegaly present.  Cardiovascular: Normal rate, regular rhythm, normal heart sounds and intact distal pulses.  Exam reveals no gallop and no friction rub.   No murmur heard. Pulmonary/Chest: Effort normal and breath sounds normal. No respiratory distress. He has no wheezes. He has no rales. He exhibits no tenderness.  Abdominal: Soft. Bowel sounds are normal. He exhibits no distension and no mass. There is no tenderness. There is no rebound and no guarding.  Genitourinary: Rectum normal, prostate normal and penis normal. Guaiac  negative stool. No penile tenderness.  Musculoskeletal: Normal range of motion. He exhibits no edema or tenderness.  Lymphadenopathy:    He has no cervical adenopathy.  Neurological: He is alert and oriented to person, place, and time. He has normal reflexes. No cranial nerve deficit. He exhibits normal muscle tone. Coordination normal.  Skin: Skin is warm and dry. No rash noted. He is not diaphoretic. No erythema. No pallor.  Psychiatric: He has a normal mood and affect. His behavior is normal. Judgment and thought content normal.          Assessment & Plan:  Well exam. We discussed diet and exercise. Get fasting labs.  Laurey Morale, MD

## 2015-08-24 ENCOUNTER — Ambulatory Visit (INDEPENDENT_AMBULATORY_CARE_PROVIDER_SITE_OTHER): Payer: PPO | Admitting: Surgery

## 2015-08-24 DIAGNOSIS — I4891 Unspecified atrial fibrillation: Secondary | ICD-10-CM | POA: Diagnosis not present

## 2015-08-24 DIAGNOSIS — I482 Chronic atrial fibrillation, unspecified: Secondary | ICD-10-CM

## 2015-08-24 DIAGNOSIS — Z5181 Encounter for therapeutic drug level monitoring: Secondary | ICD-10-CM | POA: Diagnosis not present

## 2015-08-24 LAB — POCT INR: INR: 4.9

## 2015-08-29 ENCOUNTER — Ambulatory Visit (INDEPENDENT_AMBULATORY_CARE_PROVIDER_SITE_OTHER): Payer: PPO | Admitting: *Deleted

## 2015-08-29 ENCOUNTER — Telehealth: Payer: Self-pay | Admitting: Cardiology

## 2015-08-29 DIAGNOSIS — I5022 Chronic systolic (congestive) heart failure: Secondary | ICD-10-CM

## 2015-08-29 DIAGNOSIS — Z95810 Presence of automatic (implantable) cardiac defibrillator: Secondary | ICD-10-CM

## 2015-08-29 DIAGNOSIS — I429 Cardiomyopathy, unspecified: Secondary | ICD-10-CM

## 2015-08-29 LAB — CUP PACEART REMOTE DEVICE CHECK
Date Time Interrogation Session: 20170501210213
HighPow Impedance: 73 Ohm
HighPow Impedance: 73 Ohm
Implantable Lead Implant Date: 20160721
Implantable Lead Implant Date: 20160721
Implantable Lead Location: 753858
Implantable Lead Location: 753859
Implantable Lead Model: 7122
Lead Channel Pacing Threshold Amplitude: 1 V
Lead Channel Pacing Threshold Amplitude: 1.5 V
Lead Channel Pacing Threshold Pulse Width: 0.5 ms
Lead Channel Sensing Intrinsic Amplitude: 1.9 mV
Lead Channel Setting Pacing Amplitude: 2.5 V
Lead Channel Setting Pacing Pulse Width: 0.5 ms
Lead Channel Setting Pacing Pulse Width: 0.5 ms
Lead Channel Setting Sensing Sensitivity: 0.5 mV
MDC IDC LEAD IMPLANT DT: 20100521
MDC IDC LEAD LOCATION: 753860
MDC IDC MSMT BATTERY REMAINING LONGEVITY: 85 mo
MDC IDC MSMT BATTERY REMAINING PERCENTAGE: 89 %
MDC IDC MSMT BATTERY VOLTAGE: 3.11 V
MDC IDC MSMT LEADCHNL LV IMPEDANCE VALUE: 730 Ohm
MDC IDC MSMT LEADCHNL RA IMPEDANCE VALUE: 430 Ohm
MDC IDC MSMT LEADCHNL RV IMPEDANCE VALUE: 600 Ohm
MDC IDC MSMT LEADCHNL RV PACING THRESHOLD PULSEWIDTH: 0.5 ms
MDC IDC MSMT LEADCHNL RV SENSING INTR AMPL: 9.2 mV
MDC IDC SET LEADCHNL LV PACING AMPLITUDE: 2.5 V
Pulse Gen Serial Number: 7289863

## 2015-08-29 NOTE — Telephone Encounter (Signed)
Confirmed remote transmission w/ pt wife.   

## 2015-08-30 NOTE — Progress Notes (Signed)
Remote ICD transmission.   

## 2015-08-31 ENCOUNTER — Ambulatory Visit (INDEPENDENT_AMBULATORY_CARE_PROVIDER_SITE_OTHER): Payer: PPO | Admitting: *Deleted

## 2015-08-31 DIAGNOSIS — Z5181 Encounter for therapeutic drug level monitoring: Secondary | ICD-10-CM | POA: Diagnosis not present

## 2015-08-31 DIAGNOSIS — I4891 Unspecified atrial fibrillation: Secondary | ICD-10-CM | POA: Diagnosis not present

## 2015-08-31 DIAGNOSIS — I482 Chronic atrial fibrillation, unspecified: Secondary | ICD-10-CM

## 2015-08-31 LAB — POCT INR: INR: 2.2

## 2015-09-06 DIAGNOSIS — H2513 Age-related nuclear cataract, bilateral: Secondary | ICD-10-CM | POA: Diagnosis not present

## 2015-09-06 DIAGNOSIS — M109 Gout, unspecified: Secondary | ICD-10-CM | POA: Diagnosis not present

## 2015-09-06 DIAGNOSIS — L4050 Arthropathic psoriasis, unspecified: Secondary | ICD-10-CM | POA: Diagnosis not present

## 2015-09-06 DIAGNOSIS — H401132 Primary open-angle glaucoma, bilateral, moderate stage: Secondary | ICD-10-CM | POA: Diagnosis not present

## 2015-09-06 DIAGNOSIS — H353121 Nonexudative age-related macular degeneration, left eye, early dry stage: Secondary | ICD-10-CM | POA: Diagnosis not present

## 2015-09-07 ENCOUNTER — Ambulatory Visit (INDEPENDENT_AMBULATORY_CARE_PROVIDER_SITE_OTHER): Payer: PPO | Admitting: *Deleted

## 2015-09-07 DIAGNOSIS — Z5181 Encounter for therapeutic drug level monitoring: Secondary | ICD-10-CM | POA: Diagnosis not present

## 2015-09-07 DIAGNOSIS — I4891 Unspecified atrial fibrillation: Secondary | ICD-10-CM

## 2015-09-07 DIAGNOSIS — I482 Chronic atrial fibrillation, unspecified: Secondary | ICD-10-CM

## 2015-09-07 LAB — POCT INR: INR: 3.6

## 2015-09-16 ENCOUNTER — Ambulatory Visit (INDEPENDENT_AMBULATORY_CARE_PROVIDER_SITE_OTHER): Payer: PPO | Admitting: *Deleted

## 2015-09-16 DIAGNOSIS — I482 Chronic atrial fibrillation, unspecified: Secondary | ICD-10-CM

## 2015-09-16 DIAGNOSIS — I4891 Unspecified atrial fibrillation: Secondary | ICD-10-CM | POA: Diagnosis not present

## 2015-09-16 DIAGNOSIS — Z5181 Encounter for therapeutic drug level monitoring: Secondary | ICD-10-CM

## 2015-09-16 LAB — POCT INR: INR: 2.7

## 2015-09-20 ENCOUNTER — Telehealth: Payer: Self-pay | Admitting: Family Medicine

## 2015-09-20 MED ORDER — PREDNISONE 10 MG PO TABS: 10.0000 mg | ORAL_TABLET | Freq: Every day | ORAL | Status: DC

## 2015-09-20 NOTE — Telephone Encounter (Signed)
Pt would like a call to see if Dr. Sarajane Jews will give him a refill on his Prednisone that the referring Dr. gave to him but is out of town.

## 2015-09-20 NOTE — Telephone Encounter (Signed)
Was this Prednisone that he takes every day? What is the dose?

## 2015-09-20 NOTE — Telephone Encounter (Signed)
Call in Prednisone 10 mg daily, #90 with 3 rf 

## 2015-09-20 NOTE — Telephone Encounter (Signed)
I spoke with pt and he takes 10 mg every day.

## 2015-09-20 NOTE — Telephone Encounter (Signed)
I sent script e-scribe and spoke with pt. 

## 2015-09-20 NOTE — Telephone Encounter (Signed)
Per Dr. Sarajane Jews change to a 90 day supply only.

## 2015-09-30 ENCOUNTER — Ambulatory Visit (INDEPENDENT_AMBULATORY_CARE_PROVIDER_SITE_OTHER): Payer: PPO | Admitting: *Deleted

## 2015-09-30 DIAGNOSIS — Z5181 Encounter for therapeutic drug level monitoring: Secondary | ICD-10-CM

## 2015-09-30 DIAGNOSIS — I482 Chronic atrial fibrillation, unspecified: Secondary | ICD-10-CM

## 2015-09-30 DIAGNOSIS — I4891 Unspecified atrial fibrillation: Secondary | ICD-10-CM | POA: Diagnosis not present

## 2015-09-30 LAB — POCT INR: INR: 3.2

## 2015-10-07 ENCOUNTER — Encounter: Payer: Self-pay | Admitting: Cardiology

## 2015-10-14 ENCOUNTER — Ambulatory Visit (INDEPENDENT_AMBULATORY_CARE_PROVIDER_SITE_OTHER): Payer: PPO | Admitting: *Deleted

## 2015-10-14 DIAGNOSIS — Z5181 Encounter for therapeutic drug level monitoring: Secondary | ICD-10-CM | POA: Diagnosis not present

## 2015-10-14 DIAGNOSIS — I4891 Unspecified atrial fibrillation: Secondary | ICD-10-CM

## 2015-10-14 DIAGNOSIS — I482 Chronic atrial fibrillation, unspecified: Secondary | ICD-10-CM

## 2015-10-14 LAB — POCT INR: INR: 2.6

## 2015-10-17 DIAGNOSIS — M79671 Pain in right foot: Secondary | ICD-10-CM | POA: Diagnosis not present

## 2015-10-17 DIAGNOSIS — L4050 Arthropathic psoriasis, unspecified: Secondary | ICD-10-CM | POA: Diagnosis not present

## 2015-10-17 DIAGNOSIS — M109 Gout, unspecified: Secondary | ICD-10-CM | POA: Diagnosis not present

## 2015-10-17 DIAGNOSIS — M79672 Pain in left foot: Secondary | ICD-10-CM | POA: Diagnosis not present

## 2015-10-17 DIAGNOSIS — Z79899 Other long term (current) drug therapy: Secondary | ICD-10-CM | POA: Diagnosis not present

## 2015-11-04 ENCOUNTER — Ambulatory Visit (INDEPENDENT_AMBULATORY_CARE_PROVIDER_SITE_OTHER): Payer: PPO | Admitting: *Deleted

## 2015-11-04 DIAGNOSIS — Z5181 Encounter for therapeutic drug level monitoring: Secondary | ICD-10-CM | POA: Diagnosis not present

## 2015-11-04 DIAGNOSIS — I482 Chronic atrial fibrillation, unspecified: Secondary | ICD-10-CM

## 2015-11-04 DIAGNOSIS — I4891 Unspecified atrial fibrillation: Secondary | ICD-10-CM | POA: Diagnosis not present

## 2015-11-04 LAB — POCT INR: INR: 2.1

## 2015-11-08 DIAGNOSIS — L4050 Arthropathic psoriasis, unspecified: Secondary | ICD-10-CM | POA: Diagnosis not present

## 2015-11-08 DIAGNOSIS — Z79899 Other long term (current) drug therapy: Secondary | ICD-10-CM | POA: Diagnosis not present

## 2015-11-11 ENCOUNTER — Other Ambulatory Visit: Payer: Self-pay | Admitting: Internal Medicine

## 2015-11-25 ENCOUNTER — Ambulatory Visit (INDEPENDENT_AMBULATORY_CARE_PROVIDER_SITE_OTHER): Payer: PPO | Admitting: *Deleted

## 2015-11-25 ENCOUNTER — Ambulatory Visit (INDEPENDENT_AMBULATORY_CARE_PROVIDER_SITE_OTHER): Payer: PPO | Admitting: Internal Medicine

## 2015-11-25 ENCOUNTER — Encounter: Payer: Self-pay | Admitting: Internal Medicine

## 2015-11-25 VITALS — BP 118/60 | HR 75 | Ht 70.0 in | Wt 191.1 lb

## 2015-11-25 DIAGNOSIS — I4891 Unspecified atrial fibrillation: Secondary | ICD-10-CM

## 2015-11-25 DIAGNOSIS — Z950 Presence of cardiac pacemaker: Secondary | ICD-10-CM | POA: Diagnosis not present

## 2015-11-25 DIAGNOSIS — I482 Chronic atrial fibrillation, unspecified: Secondary | ICD-10-CM

## 2015-11-25 DIAGNOSIS — Z5181 Encounter for therapeutic drug level monitoring: Secondary | ICD-10-CM | POA: Diagnosis not present

## 2015-11-25 DIAGNOSIS — I495 Sick sinus syndrome: Secondary | ICD-10-CM

## 2015-11-25 DIAGNOSIS — I429 Cardiomyopathy, unspecified: Secondary | ICD-10-CM

## 2015-11-25 DIAGNOSIS — I5022 Chronic systolic (congestive) heart failure: Secondary | ICD-10-CM | POA: Diagnosis not present

## 2015-11-25 LAB — POCT INR: INR: 2.7

## 2015-11-25 NOTE — Progress Notes (Signed)
HPI [name] returns today for followup. He is a pleasant 80 yo man with a h/o atrial fib, symptomatic bradycardia and LBBB. He is s/p biV ICD but only paces 30% of the time. He has class 2 CHF symptoms. His EF was 15-20% initially then 25-30% by repeat echo. He has been on a beta blocker and ACE inhibitor and with initiation of lasix his dyspnea has resolved. He denies chest pain. He denies peripheral edema. Allergies  Allergen Reactions  . Zithromax [Azithromycin] Other (See Comments)    Affects Coumadin levels      Current Outpatient Prescriptions  Medication Sig Dispense Refill  . allopurinol (ZYLOPRIM) 300 MG tablet Take 1 tablet (300 mg total) by mouth daily. 90 tablet 3  . amoxicillin (AMOXIL) 500 MG tablet Take 2,000 mg by mouth as directed. Reported on 08/23/2015    . calcipotriene (DOVONOX) 0.005 % cream Apply 1 application topically 2 (two) times daily as needed. rash    . carvedilol (COREG) 3.125 MG tablet Take 1 tablet (3.125 mg total) by mouth 2 (two) times daily with a meal. 60 tablet 5  . digoxin (LANOXIN) 0.125 MG tablet TAKE 1 TABLET (0.125 MG TOTAL) BY MOUTH DAILY. 30 tablet 6  . dorzolamide-timolol (COSOPT) 22.3-6.8 MG/ML ophthalmic solution Place 1 drop into both eyes 2 (two) times daily.    . folic acid (FOLVITE) 1 MG tablet Take 1 mg by mouth daily.    . furosemide (LASIX) 40 MG tablet TAKE 1 TABLET (40 MG TOTAL) BY MOUTH DAILY. 30 tablet 3  . hydrocortisone cream 1 % Apply 1 application topically daily as needed for itching. Reported on 06/20/2015    . latanoprost (XALATAN) 0.005 % ophthalmic solution Place 1 drop into both eyes at bedtime.    . Lutein 20 MG CAPS Take 20 mg by mouth at bedtime.     . methotrexate (RHEUMATREX) 2.5 MG tablet Take 4 tablets by mouth only on Friday and Saturday mornings    . predniSONE (DELTASONE) 10 MG tablet Take 5 mg by mouth daily with breakfast.    . rosuvastatin (CRESTOR) 10 MG tablet Take 10 mg by mouth daily.    Marland Kitchen  spironolactone (ALDACTONE) 25 MG tablet TAKE 1 TABLET (25 MG TOTAL) BY MOUTH DAILY. 90 tablet 2  . warfarin (COUMADIN) 5 MG tablet TAKE AS DIRECTED BY COUMADIN CLINIC 100 tablet 1   No current facility-administered medications for this visit.      Past Medical History:  Diagnosis Date  . AICD (automatic cardioverter/defibrillator) present   . Anemia, iron deficiency    hx  . Arthritis    "all over"  . Atrial fibrillation [facility])    sees Dr. Virl Vance  . Bronchial pneumonia X 3 "when I was a kid"  . Complication of anesthesia 1976   BP elevated with spinal;  "I was still in the service when that happened"  . GERD (gastroesophageal reflux disease)   . GI bleed    hx  . H/O hiatal hernia   . Hyperlipidemia   . Hypertension   . Psoriatic arthritis ([facility])   . Tachycardia-bradycardia syndrome (Andrew Vance)     ROS:   All systems reviewed and negative except as noted in the HPI.   Past Surgical History:  Procedure Laterality Date  . APPENDECTOMY    . CARDIAC CATHETERIZATION  09/10/08  . CARDIAC DEFIBRILLATOR PLACEMENT  11/18/2014  . COLONOSCOPY  05-27-10   per Dr, Andrew Vance, extensive diverticulosis, repeat in 10  yrs   . EP IMPLANTABLE DEVICE N/A 11/18/2014   Procedure: BiV ICD Upgrade;  Surgeon: Andrew Lance, MD;  Location: [facility];  Service: Cardiovascular;  Laterality: N/A;  . ESOPHAGOGASTRODUODENOSCOPY  05-27-10   per Dr. Ardis Vance, normal   . HAND SURGERY Left 1978   lft hand, fusions to DIPs and PIPs of fingers 1,2,4,and 5  . INSERT / REPLACE / REMOVE PACEMAKER  2010  . JOINT REPLACEMENT    . PILONIDAL CYST EXCISION  1961  . TOTAL HIP ARTHROPLASTY Bilateral 1998 `2001   bilateral, sees Dr. Para Vance   . TOTAL HIP REVISION  02/25/2012   Procedure: TOTAL HIP REVISION;  Surgeon: Andrew Salen, MD;  Location: [facility];  Service: Orthopedics;  Laterality: Left;     Family History  Problem Relation Age of Onset  . Hypertension Other   . Heart disease Other      Social  History   Social History  . Marital status: Married    Spouse name: N/A  . Number of children: N/A  . Years of education: N/A   Occupational History  . Not on file.   Social History Main Topics  . Smoking status: Former Smoker    Packs/day: 1.00    Years: 30.00    Types: Cigarettes, Pipe    Quit date: 02/17/1982  . Smokeless tobacco: Former Systems developer    Quit date: 05/01/1983  . Alcohol use 1.8 oz/week    1 Standard drinks or equivalent, 2 Shots of liquor per week  . Drug use: No  . Sexual activity: Yes   Other Topics Concern  . Not on file   Social History Narrative  . No narrative on file     BP 118/60   Pulse 75   Ht 5\' 10"  (1.778 m)   Wt 191 lb 1.9 oz (86.7 kg)   BMI 27.42 kg/m   Physical Exam:  Well appearing 80 yo man, NAD HEENT: Unremarkable Neck:  7 cm JVD, no thyromegally Back:  No CVA tenderness Lungs:  Clear with basilar rales HEART:  Regular rate rhythm, no murmurs, no rubs, no clicks Abd:  soft, positive bowel sounds, no organomegally, no rebound, no guarding Ext:  2 plus pulses, no edema, no cyanosis, no clubbing Skin:  No rashes no nodules Neuro:  CN II through XII intact, motor grossly intact   DEVICE  Normal device function.  See PaceArt for details.   Assess/Plan: 1. Chronic systolic heart failure - his symptoms remain class 2. He is still working! He will continue his current meds and I have encouraged him to maintain a low sodium diet. 2. Atrial fib - his ventricular rate is a bit better but still not pacing as much as we would like. I considered AV node ablation but clinically, his situation would not support this. 3. BiV ICD - his device is working normally. Will recheck in several months.  Andrew Vance.D.

## 2015-11-25 NOTE — Patient Instructions (Signed)
Medication Instructions:  Your physician recommends that you continue on your current medications as directed. Please refer to the Current Medication list given to you today.   Labwork: None ordered   Testing/Procedures: None ordered   Follow-Up: Your physician wants you to follow-up in: 12 months with Dr Knox Saliva will receive a reminder letter in the mail two months in advance. If you don't receive a letter, please call our office to schedule the follow-up appointment.  Remote monitoring is used to monitor your ICD from home. This monitoring reduces the number of office visits required to check your device to one time per year. It allows [place] to keep an eye on the functioning of your device to ensure it is working properly. You are scheduled for a device check from home on 02/24/16. You may send your transmission at any time that day. If you have a wireless device, the transmission will be sent automatically. After your physician reviews your transmission, you will receive a postcard with your next transmission date.     Any Other Special Instructions Will Be Listed Below (If Applicable).     If you need a refill on your cardiac medications before your next appointment, please call your pharmacy.

## 2015-11-28 ENCOUNTER — Other Ambulatory Visit: Payer: Self-pay | Admitting: Internal Medicine

## 2015-11-28 DIAGNOSIS — M109 Gout, unspecified: Secondary | ICD-10-CM | POA: Diagnosis not present

## 2015-11-28 DIAGNOSIS — M25462 Effusion, left knee: Secondary | ICD-10-CM | POA: Diagnosis not present

## 2015-11-28 DIAGNOSIS — Z79899 Other long term (current) drug therapy: Secondary | ICD-10-CM | POA: Diagnosis not present

## 2015-11-28 DIAGNOSIS — L4050 Arthropathic psoriasis, unspecified: Secondary | ICD-10-CM | POA: Diagnosis not present

## 2015-11-28 LAB — CUP PACEART INCLINIC DEVICE CHECK
Battery Remaining Longevity: 85.2
Brady Statistic RA Percent Paced: 0 %
Brady Statistic RV Percent Paced: 37 %
HighPow Impedance: 65.25 Ohm
Implantable Lead Implant Date: 20160721
Implantable Lead Location: 753858
Lead Channel Impedance Value: 575 Ohm
Lead Channel Impedance Value: 700 Ohm
Lead Channel Pacing Threshold Amplitude: 1.25 V
Lead Channel Pacing Threshold Amplitude: 1.5 V
Lead Channel Pacing Threshold Pulse Width: 0.5 ms
Lead Channel Pacing Threshold Pulse Width: 0.5 ms
Lead Channel Setting Pacing Amplitude: 2.5 V
Lead Channel Setting Pacing Amplitude: 2.5 V
Lead Channel Setting Pacing Pulse Width: 0.5 ms
Lead Channel Setting Sensing Sensitivity: 0.5 mV
MDC IDC LEAD IMPLANT DT: 20100521
MDC IDC LEAD IMPLANT DT: 20160721
MDC IDC LEAD LOCATION: 753859
MDC IDC LEAD LOCATION: 753860
MDC IDC LEAD MODEL: 7122
MDC IDC MSMT LEADCHNL LV PACING THRESHOLD AMPLITUDE: 1.5 V
MDC IDC MSMT LEADCHNL LV PACING THRESHOLD PULSEWIDTH: 0.5 ms
MDC IDC MSMT LEADCHNL LV PACING THRESHOLD PULSEWIDTH: 0.5 ms
MDC IDC MSMT LEADCHNL RA IMPEDANCE VALUE: 425 Ohm
MDC IDC MSMT LEADCHNL RA SENSING INTR AMPL: 0.8 mV
MDC IDC MSMT LEADCHNL RV PACING THRESHOLD AMPLITUDE: 1.25 V
MDC IDC MSMT LEADCHNL RV SENSING INTR AMPL: 6.9 mV
MDC IDC PG SERIAL: 7289863
MDC IDC SESS DTM: 20170728115017
MDC IDC SET LEADCHNL RV PACING PULSEWIDTH: 0.5 ms

## 2015-11-29 ENCOUNTER — Encounter: Payer: Self-pay | Admitting: Internal Medicine

## 2015-12-05 DIAGNOSIS — M9903 Segmental and somatic dysfunction of lumbar region: Secondary | ICD-10-CM | POA: Diagnosis not present

## 2015-12-05 DIAGNOSIS — M5136 Other intervertebral disc degeneration, lumbar region: Secondary | ICD-10-CM | POA: Diagnosis not present

## 2015-12-05 DIAGNOSIS — M9904 Segmental and somatic dysfunction of sacral region: Secondary | ICD-10-CM | POA: Diagnosis not present

## 2015-12-05 DIAGNOSIS — M9901 Segmental and somatic dysfunction of cervical region: Secondary | ICD-10-CM | POA: Diagnosis not present

## 2015-12-10 ENCOUNTER — Other Ambulatory Visit: Payer: Self-pay | Admitting: Family Medicine

## 2015-12-12 ENCOUNTER — Telehealth: Payer: Self-pay | Admitting: Internal Medicine

## 2015-12-12 NOTE — Telephone Encounter (Signed)
Spoke with pt's wife.  Pt is having a single extraction.  She states he just always holds his Coumadin for dental work.  I explained to her that for a simple extraction, we would recommend him continuing his Coumadin.  If the dentist feels as if it will be a complicated extraction and wants to hold his Coumadin then will need to clear using protocol.  Wife will inform patient and he will call if he has any questions.

## 2015-12-12 NOTE — Telephone Encounter (Signed)
New message      Pt want to ask a questions about will he need to move his 12-23-15 pt/inr appt out since he is having a tooth extracted on the 24th?  Please call

## 2015-12-13 DIAGNOSIS — H2513 Age-related nuclear cataract, bilateral: Secondary | ICD-10-CM | POA: Diagnosis not present

## 2015-12-13 DIAGNOSIS — H401132 Primary open-angle glaucoma, bilateral, moderate stage: Secondary | ICD-10-CM | POA: Diagnosis not present

## 2015-12-13 DIAGNOSIS — H353131 Nonexudative age-related macular degeneration, bilateral, early dry stage: Secondary | ICD-10-CM | POA: Diagnosis not present

## 2015-12-13 NOTE — Telephone Encounter (Signed)
LMOM for pt that he needs to hold coumadin for extraction call us back. Normally do not hold for simple extraction.

## 2015-12-17 ENCOUNTER — Other Ambulatory Visit: Payer: Self-pay | Admitting: Family Medicine

## 2015-12-23 ENCOUNTER — Ambulatory Visit (INDEPENDENT_AMBULATORY_CARE_PROVIDER_SITE_OTHER): Payer: PPO | Admitting: Pharmacist

## 2015-12-23 DIAGNOSIS — M5136 Other intervertebral disc degeneration, lumbar region: Secondary | ICD-10-CM | POA: Diagnosis not present

## 2015-12-23 DIAGNOSIS — Z5181 Encounter for therapeutic drug level monitoring: Secondary | ICD-10-CM | POA: Diagnosis not present

## 2015-12-23 DIAGNOSIS — I4891 Unspecified atrial fibrillation: Secondary | ICD-10-CM

## 2015-12-23 DIAGNOSIS — M9903 Segmental and somatic dysfunction of lumbar region: Secondary | ICD-10-CM | POA: Diagnosis not present

## 2015-12-23 DIAGNOSIS — M9904 Segmental and somatic dysfunction of sacral region: Secondary | ICD-10-CM | POA: Diagnosis not present

## 2015-12-23 DIAGNOSIS — M9901 Segmental and somatic dysfunction of cervical region: Secondary | ICD-10-CM | POA: Diagnosis not present

## 2015-12-23 LAB — POCT INR: INR: 1.5

## 2015-12-25 ENCOUNTER — Other Ambulatory Visit: Payer: Self-pay | Admitting: Internal Medicine

## 2015-12-26 NOTE — Telephone Encounter (Signed)
Yes  Send to PCP

## 2015-12-26 NOTE — Telephone Encounter (Signed)
Should this be forwarded to patients pcp as he manages patients lipids? Please advise. Thanks, MI

## 2015-12-28 ENCOUNTER — Encounter: Payer: Self-pay | Admitting: Family Medicine

## 2015-12-28 ENCOUNTER — Encounter: Payer: Self-pay | Admitting: Internal Medicine

## 2015-12-29 NOTE — Telephone Encounter (Signed)
Yes this was prescribed as a preventative measure since he can ill afford to have any blockages in his coronary arteries along with his atrial fibrillation, etc. I think he should stay on this

## 2015-12-30 MED ORDER — ROSUVASTATIN CALCIUM 10 MG PO TABS: 10.0000 mg | ORAL_TABLET | Freq: Every day | ORAL | 0 refills | Status: DC

## 2016-01-03 DIAGNOSIS — M5031 Other cervical disc degeneration,  high cervical region: Secondary | ICD-10-CM | POA: Diagnosis not present

## 2016-01-03 DIAGNOSIS — M9901 Segmental and somatic dysfunction of cervical region: Secondary | ICD-10-CM | POA: Diagnosis not present

## 2016-01-06 ENCOUNTER — Ambulatory Visit (INDEPENDENT_AMBULATORY_CARE_PROVIDER_SITE_OTHER): Payer: PPO

## 2016-01-06 DIAGNOSIS — Z23 Encounter for immunization: Secondary | ICD-10-CM | POA: Diagnosis not present

## 2016-01-13 ENCOUNTER — Ambulatory Visit (INDEPENDENT_AMBULATORY_CARE_PROVIDER_SITE_OTHER): Payer: PPO | Admitting: *Deleted

## 2016-01-13 DIAGNOSIS — I4891 Unspecified atrial fibrillation: Secondary | ICD-10-CM

## 2016-01-13 DIAGNOSIS — Z5181 Encounter for therapeutic drug level monitoring: Secondary | ICD-10-CM

## 2016-01-13 LAB — POCT INR: INR: 2.2

## 2016-01-20 DIAGNOSIS — Z79899 Other long term (current) drug therapy: Secondary | ICD-10-CM | POA: Diagnosis not present

## 2016-01-20 DIAGNOSIS — M109 Gout, unspecified: Secondary | ICD-10-CM | POA: Diagnosis not present

## 2016-01-20 DIAGNOSIS — L4050 Arthropathic psoriasis, unspecified: Secondary | ICD-10-CM | POA: Diagnosis not present

## 2016-01-26 DIAGNOSIS — M25462 Effusion, left knee: Secondary | ICD-10-CM | POA: Diagnosis not present

## 2016-01-26 DIAGNOSIS — Z79899 Other long term (current) drug therapy: Secondary | ICD-10-CM | POA: Diagnosis not present

## 2016-01-26 DIAGNOSIS — R799 Abnormal finding of blood chemistry, unspecified: Secondary | ICD-10-CM | POA: Diagnosis not present

## 2016-01-26 DIAGNOSIS — L4050 Arthropathic psoriasis, unspecified: Secondary | ICD-10-CM | POA: Diagnosis not present

## 2016-01-27 ENCOUNTER — Other Ambulatory Visit: Payer: Self-pay | Admitting: *Deleted

## 2016-01-27 MED ORDER — DIGOXIN 125 MCG PO TABS: ORAL_TABLET | ORAL | 2 refills | Status: DC

## 2016-01-31 DIAGNOSIS — M9901 Segmental and somatic dysfunction of cervical region: Secondary | ICD-10-CM | POA: Diagnosis not present

## 2016-01-31 DIAGNOSIS — M5031 Other cervical disc degeneration,  high cervical region: Secondary | ICD-10-CM | POA: Diagnosis not present

## 2016-02-10 ENCOUNTER — Ambulatory Visit (INDEPENDENT_AMBULATORY_CARE_PROVIDER_SITE_OTHER): Payer: PPO | Admitting: *Deleted

## 2016-02-10 DIAGNOSIS — Z5181 Encounter for therapeutic drug level monitoring: Secondary | ICD-10-CM | POA: Diagnosis not present

## 2016-02-10 DIAGNOSIS — I4891 Unspecified atrial fibrillation: Secondary | ICD-10-CM

## 2016-02-10 LAB — POCT INR: INR: 1.8

## 2016-02-24 ENCOUNTER — Ambulatory Visit (INDEPENDENT_AMBULATORY_CARE_PROVIDER_SITE_OTHER): Payer: PPO | Admitting: *Deleted

## 2016-02-24 DIAGNOSIS — I495 Sick sinus syndrome: Secondary | ICD-10-CM | POA: Diagnosis not present

## 2016-02-27 NOTE — Progress Notes (Signed)
Remote pacemaker transmission.   

## 2016-02-28 DIAGNOSIS — M9903 Segmental and somatic dysfunction of lumbar region: Secondary | ICD-10-CM | POA: Diagnosis not present

## 2016-02-28 DIAGNOSIS — M5136 Other intervertebral disc degeneration, lumbar region: Secondary | ICD-10-CM | POA: Diagnosis not present

## 2016-02-28 DIAGNOSIS — M9904 Segmental and somatic dysfunction of sacral region: Secondary | ICD-10-CM | POA: Diagnosis not present

## 2016-02-28 DIAGNOSIS — M9905 Segmental and somatic dysfunction of pelvic region: Secondary | ICD-10-CM | POA: Diagnosis not present

## 2016-02-29 ENCOUNTER — Encounter: Payer: Self-pay | Admitting: Cardiology

## 2016-03-02 ENCOUNTER — Ambulatory Visit (INDEPENDENT_AMBULATORY_CARE_PROVIDER_SITE_OTHER): Payer: PPO | Admitting: *Deleted

## 2016-03-02 DIAGNOSIS — I4891 Unspecified atrial fibrillation: Secondary | ICD-10-CM

## 2016-03-02 DIAGNOSIS — Z5181 Encounter for therapeutic drug level monitoring: Secondary | ICD-10-CM

## 2016-03-02 LAB — POCT INR: INR: 2.2

## 2016-03-06 DIAGNOSIS — M9903 Segmental and somatic dysfunction of lumbar region: Secondary | ICD-10-CM | POA: Diagnosis not present

## 2016-03-06 DIAGNOSIS — M9904 Segmental and somatic dysfunction of sacral region: Secondary | ICD-10-CM | POA: Diagnosis not present

## 2016-03-06 DIAGNOSIS — M9905 Segmental and somatic dysfunction of pelvic region: Secondary | ICD-10-CM | POA: Diagnosis not present

## 2016-03-06 DIAGNOSIS — M5136 Other intervertebral disc degeneration, lumbar region: Secondary | ICD-10-CM | POA: Diagnosis not present

## 2016-03-13 DIAGNOSIS — H3530 Unspecified macular degeneration: Secondary | ICD-10-CM | POA: Diagnosis not present

## 2016-03-13 DIAGNOSIS — H401132 Primary open-angle glaucoma, bilateral, moderate stage: Secondary | ICD-10-CM | POA: Diagnosis not present

## 2016-03-13 DIAGNOSIS — H2513 Age-related nuclear cataract, bilateral: Secondary | ICD-10-CM | POA: Diagnosis not present

## 2016-03-22 LAB — CUP PACEART REMOTE DEVICE CHECK
Battery Remaining Longevity: 79 mo
Battery Remaining Percentage: 84 %
Battery Voltage: 3.01 V
HIGH POWER IMPEDANCE MEASURED VALUE: 59 Ohm
HIGH POWER IMPEDANCE MEASURED VALUE: 59 Ohm
Implantable Lead Implant Date: 20160721
Implantable Lead Location: 753858
Lead Channel Impedance Value: 600 Ohm
Lead Channel Sensing Intrinsic Amplitude: 0.8 mV
Lead Channel Sensing Intrinsic Amplitude: 6.7 mV
Lead Channel Setting Pacing Amplitude: 2.5 V
Lead Channel Setting Pacing Amplitude: 2.5 V
Lead Channel Setting Pacing Pulse Width: 0.5 ms
MDC IDC LEAD IMPLANT DT: 20100521
MDC IDC LEAD IMPLANT DT: 20160721
MDC IDC LEAD LOCATION: 753859
MDC IDC LEAD LOCATION: 753860
MDC IDC LEAD MODEL: 7122
MDC IDC MSMT LEADCHNL LV IMPEDANCE VALUE: 680 Ohm
MDC IDC MSMT LEADCHNL LV PACING THRESHOLD AMPLITUDE: 1.5 V
MDC IDC MSMT LEADCHNL LV PACING THRESHOLD PULSEWIDTH: 0.5 ms
MDC IDC MSMT LEADCHNL RA IMPEDANCE VALUE: 360 Ohm
MDC IDC MSMT LEADCHNL RV PACING THRESHOLD AMPLITUDE: 1.25 V
MDC IDC MSMT LEADCHNL RV PACING THRESHOLD PULSEWIDTH: 0.5 ms
MDC IDC PG IMPLANT DT: 20160721
MDC IDC PG SERIAL: 7289863
MDC IDC SESS DTM: 20171027202520
MDC IDC SET LEADCHNL RV PACING PULSEWIDTH: 0.5 ms
MDC IDC SET LEADCHNL RV SENSING SENSITIVITY: 0.5 mV

## 2016-03-27 ENCOUNTER — Other Ambulatory Visit: Payer: Self-pay | Admitting: Family Medicine

## 2016-03-28 DIAGNOSIS — M9903 Segmental and somatic dysfunction of lumbar region: Secondary | ICD-10-CM | POA: Diagnosis not present

## 2016-03-28 DIAGNOSIS — M9904 Segmental and somatic dysfunction of sacral region: Secondary | ICD-10-CM | POA: Diagnosis not present

## 2016-03-28 DIAGNOSIS — M9905 Segmental and somatic dysfunction of pelvic region: Secondary | ICD-10-CM | POA: Diagnosis not present

## 2016-03-28 DIAGNOSIS — M5136 Other intervertebral disc degeneration, lumbar region: Secondary | ICD-10-CM | POA: Diagnosis not present

## 2016-03-30 ENCOUNTER — Ambulatory Visit (INDEPENDENT_AMBULATORY_CARE_PROVIDER_SITE_OTHER): Payer: PPO | Admitting: *Deleted

## 2016-03-30 DIAGNOSIS — I4891 Unspecified atrial fibrillation: Secondary | ICD-10-CM

## 2016-03-30 DIAGNOSIS — Z5181 Encounter for therapeutic drug level monitoring: Secondary | ICD-10-CM | POA: Diagnosis not present

## 2016-03-30 LAB — POCT INR: INR: 2.3

## 2016-04-12 ENCOUNTER — Other Ambulatory Visit: Payer: Self-pay | Admitting: Internal Medicine

## 2016-04-25 DIAGNOSIS — M5136 Other intervertebral disc degeneration, lumbar region: Secondary | ICD-10-CM | POA: Diagnosis not present

## 2016-04-25 DIAGNOSIS — M9903 Segmental and somatic dysfunction of lumbar region: Secondary | ICD-10-CM | POA: Diagnosis not present

## 2016-04-25 DIAGNOSIS — M9905 Segmental and somatic dysfunction of pelvic region: Secondary | ICD-10-CM | POA: Diagnosis not present

## 2016-04-25 DIAGNOSIS — M9904 Segmental and somatic dysfunction of sacral region: Secondary | ICD-10-CM | POA: Diagnosis not present

## 2016-04-26 DIAGNOSIS — R799 Abnormal finding of blood chemistry, unspecified: Secondary | ICD-10-CM | POA: Diagnosis not present

## 2016-04-26 DIAGNOSIS — L4050 Arthropathic psoriasis, unspecified: Secondary | ICD-10-CM | POA: Diagnosis not present

## 2016-04-26 DIAGNOSIS — Z79899 Other long term (current) drug therapy: Secondary | ICD-10-CM | POA: Diagnosis not present

## 2016-05-11 ENCOUNTER — Ambulatory Visit (INDEPENDENT_AMBULATORY_CARE_PROVIDER_SITE_OTHER): Payer: PPO

## 2016-05-11 DIAGNOSIS — Z5181 Encounter for therapeutic drug level monitoring: Secondary | ICD-10-CM

## 2016-05-11 DIAGNOSIS — I4891 Unspecified atrial fibrillation: Secondary | ICD-10-CM | POA: Diagnosis not present

## 2016-05-11 LAB — POCT INR: INR: 2

## 2016-05-25 ENCOUNTER — Other Ambulatory Visit: Payer: Self-pay | Admitting: Family Medicine

## 2016-05-28 ENCOUNTER — Telehealth: Payer: Self-pay | Admitting: Cardiology

## 2016-05-28 ENCOUNTER — Ambulatory Visit: Payer: PPO | Admitting: *Deleted

## 2016-05-28 DIAGNOSIS — M79641 Pain in right hand: Secondary | ICD-10-CM | POA: Diagnosis not present

## 2016-05-28 DIAGNOSIS — L4050 Arthropathic psoriasis, unspecified: Secondary | ICD-10-CM | POA: Diagnosis not present

## 2016-05-28 DIAGNOSIS — M79642 Pain in left hand: Secondary | ICD-10-CM | POA: Diagnosis not present

## 2016-05-28 DIAGNOSIS — Z79899 Other long term (current) drug therapy: Secondary | ICD-10-CM | POA: Diagnosis not present

## 2016-05-28 DIAGNOSIS — I495 Sick sinus syndrome: Secondary | ICD-10-CM | POA: Diagnosis not present

## 2016-05-28 NOTE — Telephone Encounter (Signed)
LMOVM reminding pt to send remote transmission.   

## 2016-05-29 ENCOUNTER — Encounter: Payer: Self-pay | Admitting: Cardiology

## 2016-05-29 NOTE — Progress Notes (Signed)
Remote ICD transmission.   

## 2016-06-04 ENCOUNTER — Other Ambulatory Visit: Payer: Self-pay | Admitting: Internal Medicine

## 2016-06-13 DIAGNOSIS — H2513 Age-related nuclear cataract, bilateral: Secondary | ICD-10-CM | POA: Diagnosis not present

## 2016-06-13 DIAGNOSIS — H3530 Unspecified macular degeneration: Secondary | ICD-10-CM | POA: Diagnosis not present

## 2016-06-13 DIAGNOSIS — H401132 Primary open-angle glaucoma, bilateral, moderate stage: Secondary | ICD-10-CM | POA: Diagnosis not present

## 2016-06-22 ENCOUNTER — Ambulatory Visit (INDEPENDENT_AMBULATORY_CARE_PROVIDER_SITE_OTHER): Payer: PPO | Admitting: *Deleted

## 2016-06-22 DIAGNOSIS — I4891 Unspecified atrial fibrillation: Secondary | ICD-10-CM

## 2016-06-22 DIAGNOSIS — Z5181 Encounter for therapeutic drug level monitoring: Secondary | ICD-10-CM | POA: Diagnosis not present

## 2016-06-22 LAB — POCT INR: INR: 2.1

## 2016-07-11 DIAGNOSIS — H3530 Unspecified macular degeneration: Secondary | ICD-10-CM | POA: Diagnosis not present

## 2016-07-11 DIAGNOSIS — H401132 Primary open-angle glaucoma, bilateral, moderate stage: Secondary | ICD-10-CM | POA: Diagnosis not present

## 2016-07-11 DIAGNOSIS — H2513 Age-related nuclear cataract, bilateral: Secondary | ICD-10-CM | POA: Diagnosis not present

## 2016-07-24 ENCOUNTER — Encounter: Payer: Self-pay | Admitting: Family Medicine

## 2016-07-24 DIAGNOSIS — D692 Other nonthrombocytopenic purpura: Secondary | ICD-10-CM | POA: Diagnosis not present

## 2016-07-24 DIAGNOSIS — Z85828 Personal history of other malignant neoplasm of skin: Secondary | ICD-10-CM | POA: Diagnosis not present

## 2016-07-24 DIAGNOSIS — D1801 Hemangioma of skin and subcutaneous tissue: Secondary | ICD-10-CM | POA: Diagnosis not present

## 2016-07-24 DIAGNOSIS — L570 Actinic keratosis: Secondary | ICD-10-CM | POA: Diagnosis not present

## 2016-07-24 DIAGNOSIS — L821 Other seborrheic keratosis: Secondary | ICD-10-CM | POA: Diagnosis not present

## 2016-07-30 DIAGNOSIS — M5031 Other cervical disc degeneration,  high cervical region: Secondary | ICD-10-CM | POA: Diagnosis not present

## 2016-07-30 DIAGNOSIS — M5136 Other intervertebral disc degeneration, lumbar region: Secondary | ICD-10-CM | POA: Diagnosis not present

## 2016-07-30 DIAGNOSIS — M9903 Segmental and somatic dysfunction of lumbar region: Secondary | ICD-10-CM | POA: Diagnosis not present

## 2016-07-30 DIAGNOSIS — M9901 Segmental and somatic dysfunction of cervical region: Secondary | ICD-10-CM | POA: Diagnosis not present

## 2016-08-03 ENCOUNTER — Ambulatory Visit (INDEPENDENT_AMBULATORY_CARE_PROVIDER_SITE_OTHER): Payer: PPO | Admitting: *Deleted

## 2016-08-03 DIAGNOSIS — Z5181 Encounter for therapeutic drug level monitoring: Secondary | ICD-10-CM

## 2016-08-03 DIAGNOSIS — I4891 Unspecified atrial fibrillation: Secondary | ICD-10-CM | POA: Diagnosis not present

## 2016-08-03 LAB — POCT INR: INR: 1.8

## 2016-08-16 ENCOUNTER — Ambulatory Visit (INDEPENDENT_AMBULATORY_CARE_PROVIDER_SITE_OTHER): Payer: PPO | Admitting: *Deleted

## 2016-08-16 DIAGNOSIS — Z5181 Encounter for therapeutic drug level monitoring: Secondary | ICD-10-CM

## 2016-08-16 LAB — POCT INR: INR: 3.1

## 2016-08-23 ENCOUNTER — Ambulatory Visit (INDEPENDENT_AMBULATORY_CARE_PROVIDER_SITE_OTHER): Payer: PPO | Admitting: Family Medicine

## 2016-08-23 ENCOUNTER — Encounter: Payer: Self-pay | Admitting: Family Medicine

## 2016-08-23 VITALS — BP 111/81 | HR 71 | Temp 98.2°F | Ht 70.0 in | Wt 173.0 lb

## 2016-08-23 DIAGNOSIS — Z0000 Encounter for general adult medical examination without abnormal findings: Secondary | ICD-10-CM | POA: Diagnosis not present

## 2016-08-23 DIAGNOSIS — R972 Elevated prostate specific antigen [PSA]: Secondary | ICD-10-CM | POA: Diagnosis not present

## 2016-08-23 DIAGNOSIS — Z23 Encounter for immunization: Secondary | ICD-10-CM

## 2016-08-23 DIAGNOSIS — I10 Essential (primary) hypertension: Secondary | ICD-10-CM | POA: Diagnosis not present

## 2016-08-23 LAB — LIPID PANEL
CHOL/HDL RATIO: 3
Cholesterol: 109 mg/dL (ref 0–200)
HDL: 31.8 mg/dL — ABNORMAL LOW (ref >39.00)
LDL Cholesterol: 52 mg/dL (ref 0–99)
NONHDL: 77.34
TRIGLYCERIDES: 129 mg/dL (ref 0.0–149.0)
VLDL: 25.8 mg/dL (ref 0.0–40.0)

## 2016-08-23 LAB — POC URINALSYSI DIPSTICK (AUTOMATED)
BILIRUBIN UA: NEGATIVE
Blood, UA: NEGATIVE
GLUCOSE UA: NEGATIVE
Ketones, UA: NEGATIVE
LEUKOCYTES UA: NEGATIVE
NITRITE UA: NEGATIVE
Protein, UA: NEGATIVE
Spec Grav, UA: >=1.03 — AB (ref 1.010–1.025)
UROBILINOGEN UA: 0.2 U/dL
pH, UA: 6 (ref 5.0–8.0)

## 2016-08-23 LAB — BASIC METABOLIC PANEL
BUN: 19 mg/dL (ref 6–23)
CHLORIDE: 100 meq/L (ref 96–112)
CO2: 29 mEq/L (ref 19–32)
Calcium: 9.8 mg/dL (ref 8.4–10.5)
Creatinine, Ser: 1.18 mg/dL (ref 0.40–1.50)
GFR: 63.36 mL/min (ref >60.00)
Glucose, Bld: 96 mg/dL (ref 70–99)
POTASSIUM: 5 meq/L (ref 3.5–5.1)
SODIUM: 137 meq/L (ref 135–145)

## 2016-08-23 LAB — PSA: PSA: 9.35 ng/mL — AB (ref 0.10–4.00)

## 2016-08-23 LAB — HEPATIC FUNCTION PANEL
ALK PHOS: 69 U/L (ref 39–117)
ALT: 15 U/L (ref 0–53)
AST: 27 U/L (ref 0–37)
Albumin: 4.3 g/dL (ref 3.5–5.2)
BILIRUBIN DIRECT: 0.2 mg/dL (ref 0.0–0.3)
BILIRUBIN TOTAL: 1.1 mg/dL (ref 0.2–1.2)
Total Protein: 7.2 g/dL (ref 6.0–8.3)

## 2016-08-23 LAB — CBC WITH DIFFERENTIAL/PLATELET
BASOS ABS: 0 10*3/uL (ref 0.0–0.1)
BASOS PCT: 0.6 % (ref 0.0–3.0)
EOS ABS: 0.2 10*3/uL (ref 0.0–0.7)
Eosinophils Relative: 2 % (ref 0.0–5.0)
HEMATOCRIT: 46.3 % (ref 39.0–52.0)
HEMOGLOBIN: 15.4 g/dL (ref 13.0–17.0)
LYMPHS PCT: 24.6 % (ref 12.0–46.0)
Lymphs Abs: 2.1 10*3/uL (ref 0.7–4.0)
MCHC: 33.2 g/dL (ref 30.0–36.0)
MCV: 100.4 fl — AB (ref 78.0–100.0)
MONOS PCT: 12.2 % — AB (ref 3.0–12.0)
Monocytes Absolute: 1 10*3/uL (ref 0.1–1.0)
NEUTROS ABS: 5.1 10*3/uL (ref 1.4–7.7)
Neutrophils Relative %: 60.6 % (ref 43.0–77.0)
PLATELETS: 160 10*3/uL (ref 150.0–400.0)
RBC: 4.61 Mil/uL (ref 4.22–5.81)
RDW: 14.4 % (ref 11.5–15.5)
WBC: 8.5 10*3/uL (ref 4.0–10.5)

## 2016-08-23 LAB — TSH: TSH: 1.66 u[IU]/mL (ref 0.35–4.50)

## 2016-08-23 MED ORDER — ROSUVASTATIN CALCIUM 10 MG PO TABS: 10.0000 mg | ORAL_TABLET | Freq: Every day | ORAL | 3 refills | Status: DC

## 2016-08-23 NOTE — Progress Notes (Signed)
Pre visit review using our clinic review tool, if applicable. No additional management support is needed unless otherwise documented below in the visit note. 

## 2016-08-23 NOTE — Progress Notes (Signed)
   Subjective:    Patient ID: Andrew Vance, male    DOB: 1936-03-04, 81 y.o.   MRN: [id number]  HPI 81 yr old male for a well exam. He is doing well in general and he stays active. His BP has been stable at home. He does complain of some mild swelling and tenderness in the right breast over the past month. No nipple changes. He had a similar episode in the left breast a few years ago which was not explained and he had a negative workup including a mammogram. He does not take OTC supplements. He has been seeing Dr, Charlestine Night for psoriatic arthritis for years, but Dr. Charlestine Night will be retiring soon. Andrew Vance has decided to stop taking Methotrexate and he will let us know if the arthritis flares up.   Review of Systems  Constitutional: Negative.   HENT: Negative.   Eyes: Negative.   Respiratory: Negative.   Cardiovascular: Negative.   Gastrointestinal: Negative.   Genitourinary: Negative.   Musculoskeletal: Positive for arthralgias. Negative for back pain, gait problem, joint swelling, myalgias, neck pain and neck stiffness.  Skin: Negative.   Neurological: Negative.   Psychiatric/Behavioral: Negative.        Objective:   Physical Exam  Constitutional: He is oriented to person, place, and time. He appears well-developed and well-nourished. No distress.  HENT:  Head: Normocephalic and atraumatic.  Right Ear: External ear normal.  Left Ear: External ear normal.  Nose: Nose normal.  Mouth/Throat: Oropharynx is clear and moist. No oropharyngeal exudate.  Eyes: Conjunctivae and EOM are normal. Pupils are equal, round, and reactive to light. Right eye exhibits no discharge. Left eye exhibits no discharge. No scleral icterus.  Neck: Neck supple. No JVD present. No tracheal deviation present. No thyromegaly present.  Cardiovascular: Normal rate, regular rhythm, normal heart sounds and intact distal pulses.  Exam reveals no gallop and no friction rub.   No murmur heard. Pulmonary/Chest: Effort  normal and breath sounds normal. No respiratory distress. He has no wheezes. He has no rales. He exhibits no tenderness.  Abdominal: Soft. Bowel sounds are normal. He exhibits no distension and no mass. There is no tenderness. There is no rebound and no guarding.  Genitourinary: Rectum normal, prostate normal and penis normal. Rectal exam shows guaiac negative stool. No penile tenderness.  Musculoskeletal: Normal range of motion. He exhibits no edema or tenderness.  Lymphadenopathy:    He has no cervical adenopathy.  Neurological: He is alert and oriented to person, place, and time. He has normal reflexes. No cranial nerve deficit. He exhibits normal muscle tone. Coordination normal.  Skin: Skin is warm and dry. No rash noted. He is not diaphoretic. No erythema. No pallor.  Psychiatric: He has a normal mood and affect. His behavior is normal. Judgment and thought content normal.          Assessment & Plan:  Well exam. Get fasting labs. I agree with staying off Methotrexate and seeing how he does. I think his gynecomastia is a side effect of the Spironolactone and I think he should stop this. Before he does this however we will arrange for him to see Dr. Lovena Vance to discuss possible alternative medications.  Alysia Penna, MD

## 2016-08-23 NOTE — Patient Instructions (Signed)
WE NOW OFFER    [facility] FAST TRACK!!!  SAME DAY Appointments for ACUTE CARE  Such as: Sprains, Injuries, cuts, abrasions, rashes, muscle pain, joint pain, back pain Colds, flu, sore throats, headache, allergies, cough, fever  Ear pain, sinus and eye infections Abdominal pain, nausea, vomiting, diarrhea, upset stomach Animal/insect bites  3 Easy Ways to Schedule: Walk-In Scheduling Call in scheduling Mychart Sign-up: [email or web address]         

## 2016-08-24 NOTE — Addendum Note (Signed)
Addended by: Alysia Penna A on: 08/24/2016 01:00 PM   Modules accepted: Orders

## 2016-08-27 ENCOUNTER — Telehealth: Payer: Self-pay | Admitting: Cardiology

## 2016-08-27 ENCOUNTER — Ambulatory Visit (INDEPENDENT_AMBULATORY_CARE_PROVIDER_SITE_OTHER): Payer: PPO | Admitting: *Deleted

## 2016-08-27 DIAGNOSIS — I495 Sick sinus syndrome: Secondary | ICD-10-CM

## 2016-08-27 DIAGNOSIS — M5031 Other cervical disc degeneration,  high cervical region: Secondary | ICD-10-CM | POA: Diagnosis not present

## 2016-08-27 DIAGNOSIS — M9903 Segmental and somatic dysfunction of lumbar region: Secondary | ICD-10-CM | POA: Diagnosis not present

## 2016-08-27 DIAGNOSIS — M9901 Segmental and somatic dysfunction of cervical region: Secondary | ICD-10-CM | POA: Diagnosis not present

## 2016-08-27 DIAGNOSIS — I429 Cardiomyopathy, unspecified: Secondary | ICD-10-CM

## 2016-08-27 DIAGNOSIS — M5136 Other intervertebral disc degeneration, lumbar region: Secondary | ICD-10-CM | POA: Diagnosis not present

## 2016-08-27 NOTE — Telephone Encounter (Signed)
Confirmed remote transmission w/ pt wife.   

## 2016-08-28 ENCOUNTER — Encounter: Payer: Self-pay | Admitting: Family Medicine

## 2016-08-28 ENCOUNTER — Encounter: Payer: Self-pay | Admitting: Cardiology

## 2016-08-28 LAB — CUP PACEART REMOTE DEVICE CHECK
Battery Remaining Percentage: 78 %
HighPow Impedance: 64 Ohm
HighPow Impedance: 64 Ohm
Implantable Lead Implant Date: 20100521
Implantable Lead Implant Date: 20160721
Implantable Lead Implant Date: 20160721
Implantable Lead Location: 753858
Implantable Lead Location: 753859
Implantable Lead Model: 7122
Implantable Pulse Generator Implant Date: 20160721
Lead Channel Impedance Value: 590 Ohm
Lead Channel Pacing Threshold Amplitude: 1.5 V
Lead Channel Sensing Intrinsic Amplitude: 0.8 mV
Lead Channel Setting Pacing Amplitude: 2.5 V
Lead Channel Setting Pacing Amplitude: 2.5 V
Lead Channel Setting Pacing Pulse Width: 0.5 ms
Lead Channel Setting Sensing Sensitivity: 0.5 mV
MDC IDC LEAD LOCATION: 753860
MDC IDC MSMT BATTERY REMAINING LONGEVITY: 73 mo
MDC IDC MSMT BATTERY VOLTAGE: 2.99 V
MDC IDC MSMT LEADCHNL LV IMPEDANCE VALUE: 740 Ohm
MDC IDC MSMT LEADCHNL LV PACING THRESHOLD PULSEWIDTH: 0.5 ms
MDC IDC MSMT LEADCHNL RA IMPEDANCE VALUE: 380 Ohm
MDC IDC MSMT LEADCHNL RV PACING THRESHOLD AMPLITUDE: 1.25 V
MDC IDC MSMT LEADCHNL RV PACING THRESHOLD PULSEWIDTH: 0.5 ms
MDC IDC MSMT LEADCHNL RV SENSING INTR AMPL: 10.9 mV
MDC IDC PG SERIAL: 7289863
MDC IDC SESS DTM: 20180430210332
MDC IDC SET LEADCHNL RV PACING PULSEWIDTH: 0.5 ms

## 2016-08-28 NOTE — Progress Notes (Signed)
Remote ICD transmission.   

## 2016-08-31 ENCOUNTER — Ambulatory Visit (INDEPENDENT_AMBULATORY_CARE_PROVIDER_SITE_OTHER): Payer: PPO | Admitting: Pharmacist

## 2016-08-31 DIAGNOSIS — I4891 Unspecified atrial fibrillation: Secondary | ICD-10-CM | POA: Diagnosis not present

## 2016-08-31 DIAGNOSIS — Z5181 Encounter for therapeutic drug level monitoring: Secondary | ICD-10-CM | POA: Diagnosis not present

## 2016-08-31 LAB — POCT INR: INR: 2.1

## 2016-09-03 ENCOUNTER — Ambulatory Visit (INDEPENDENT_AMBULATORY_CARE_PROVIDER_SITE_OTHER): Payer: PPO | Admitting: Physician Assistant

## 2016-09-03 ENCOUNTER — Encounter: Payer: Self-pay | Admitting: Physician Assistant

## 2016-09-03 VITALS — BP 118/74 | HR 83 | Ht 68.0 in | Wt 175.8 lb

## 2016-09-03 DIAGNOSIS — I429 Cardiomyopathy, unspecified: Secondary | ICD-10-CM

## 2016-09-03 DIAGNOSIS — I10 Essential (primary) hypertension: Secondary | ICD-10-CM | POA: Diagnosis not present

## 2016-09-03 DIAGNOSIS — I4891 Unspecified atrial fibrillation: Secondary | ICD-10-CM

## 2016-09-03 DIAGNOSIS — N62 Hypertrophy of breast: Secondary | ICD-10-CM

## 2016-09-03 MED ORDER — EPLERENONE 25 MG PO TABS: 25.0000 mg | ORAL_TABLET | Freq: Every day | ORAL | 3 refills | Status: DC

## 2016-09-03 NOTE — Progress Notes (Signed)
Cardiology Office Note    Date:  09/03/2016   ID:  Andrew Vance, DOB 1935/09/15, MRN [id number]  PCP:  Laurey Morale, MD  Cardiologist: Dr. Lovena Le  Chief Complaint  Patient presents with  . Breast Problem    History of Present Illness:  Andrew Vance is a 81 y.o. male with history of atrial fibrillation, nonischemic cardiomyopathy LVEF 15-20% initially then 25-30% on repeat echo. He had symptomatic bradycardia and left bundle branch block status post biV ICD. Patient last saw Dr. Lovena Le 10/2015 and was doing well. His ventricular rate was still not pacing is much as he'd like and he considered AV nodal ablation but clinically his situation would not support this. Heart failure was compensated today.  Patient is referred to [place] today by Dr. Sarajane Jews because of gynecomastia. Dr. Sarajane Jews felt it was secondary to spironolactone and wanted him seen here before stopping it. Patient says he's had gynecomastia for about a year and seems to be worsening. He had to have a mammogram on his right breast because of it. He also has elevated PSA that he scheduled to see a urologist for. He denies any chest pain, palpitations, dyspnea, dyspnea on exertion, dizziness or presyncope. He's had no trouble with heart failure.    Past Medical History:  Diagnosis Date  . AICD (automatic cardioverter/defibrillator) present   . Anemia, iron deficiency    hx  . Arthritis    "all over"  . Atrial fibrillation [facility])    sees Dr. Virl Axe  . Bronchial pneumonia X 3 "when I was a kid"  . Complication of anesthesia 1976   BP elevated with spinal;  "I was still in the service when that happened"  . GERD (gastroesophageal reflux disease)   . GI bleed    hx  . H/O hiatal hernia   . Hyperlipidemia   . Hypertension   . Psoriatic arthritis [facility])    sees Dr. Hurley Cisco   . Tachycardia-bradycardia syndrome [facility])     Past Surgical History:  Procedure Laterality Date  . APPENDECTOMY    . CARDIAC  CATHETERIZATION  09/10/08  . CARDIAC DEFIBRILLATOR PLACEMENT  11/18/2014  . COLONOSCOPY  05-27-10   per Dr, Ardis Hughs, extensive diverticulosis, repeat in 10 yrs   . EP IMPLANTABLE DEVICE N/A 11/18/2014   Procedure: BiV ICD Upgrade;  Surgeon: Evans Lance, MD;  Location: [place] [facility];  Service: Cardiovascular;  Laterality: N/A;  . ESOPHAGOGASTRODUODENOSCOPY  05-27-10   per Dr. Ardis Hughs, normal   . HAND SURGERY Left 1978   lft hand, fusions to DIPs and PIPs of fingers 1,2,4,and 5  . INSERT / REPLACE / REMOVE PACEMAKER  2010  . JOINT REPLACEMENT    . PILONIDAL CYST EXCISION  1961  . TOTAL HIP ARTHROPLASTY Bilateral 1998 `2001   bilateral, sees Dr. Para March   . TOTAL HIP REVISION  02/25/2012   Procedure: TOTAL HIP REVISION;  Surgeon: Kerin Salen, MD;  Location: [place];  Service: Orthopedics;  Laterality: Left;    Current Medications: Outpatient Medications Prior to Visit  Medication Sig Dispense Refill  . amoxicillin (AMOXIL) 500 MG tablet Take 2,000 mg by mouth as directed. Prior to dentist appointment    . brimonidine (ALPHAGAN) 0.2 % ophthalmic solution Place 1 drop into both eyes 2 (two) times daily.    . carvedilol (COREG) 3.125 MG tablet TAKE 1 TABLET BY MOUTH 2 TIMES DAILY WITH A MEAL 60 tablet 10  . digoxin (LANOXIN) 0.125 MG  tablet TAKE 1 TABLET (0.125 MG TOTAL) BY MOUTH DAILY. 90 tablet 2  . furosemide (LASIX) 40 MG tablet TAKE 1 TABLET (40 MG TOTAL) BY MOUTH DAILY. 30 tablet 6  . hydrocortisone cream 1 % Apply 1 application topically daily as needed for itching. Reported on 06/20/2015    . latanoprost (XALATAN) 0.005 % ophthalmic solution Place 1 drop into both eyes at bedtime.    . Lutein 20 MG CAPS Take 20 mg by mouth at bedtime.     . rosuvastatin (CRESTOR) 10 MG tablet Take 1 tablet (10 mg total) by mouth daily. 90 tablet 3  . timolol (BETIMOL) 0.5 % ophthalmic solution 1 drop 2 (two) times daily. As directed    . warfarin (COUMADIN) 5 MG tablet TAKE AS DIRECTED BY COUMADIN  CLINIC 100 tablet 1  . spironolactone (ALDACTONE) 25 MG tablet TAKE 1 TABLET (25 MG TOTAL) BY MOUTH DAILY. 90 tablet 2   No facility-administered medications prior to visit.      Allergies:   Zithromax [azithromycin]   Social History   Social History  . Marital status: Married    Spouse name: N/A  . Number of children: N/A  . Years of education: N/A   Social History Main Topics  . Smoking status: Former Smoker    Packs/day: 1.00    Years: 30.00    Types: Cigarettes, Pipe    Quit date: 02/17/1982  . Smokeless tobacco: Former Systems developer    Quit date: 05/01/1983  . Alcohol use 1.8 oz/week    1 Standard drinks or equivalent, 2 Shots of liquor per week  . Drug use: No  . Sexual activity: Yes   Other Topics Concern  . None   Social History Narrative  . None     Family History:  The patient's family history includes Heart disease in his other; Hypertension in his other.   ROS:   Please see the history of present illness.    Review of Systems  Constitution: Negative.  HENT: Negative.   Eyes: Positive for visual disturbance.  Cardiovascular: Negative.   Respiratory: Negative.   Endocrine: Negative.   Hematologic/Lymphatic: Bruises/bleeds easily.  Musculoskeletal: Negative.   Gastrointestinal: Negative.   Genitourinary: Negative.   Neurological: Negative.    All other systems reviewed and are negative.   PHYSICAL EXAM:   VS:  BP 118/74   Pulse 83   Ht 5\' 8"  (1.727 m)   Wt 175 lb 12.8 oz (79.7 kg)   SpO2 98%   BMI 26.73 kg/m   Physical Exam  GEN: Well nourished, well developed, in no acute distress  Neck: no JVD, carotid bruits, or masses Cardiac: Breast mildly enlarged, Pacer wire is raised on his chest but has been stable and unchanged, irregular irregular with 1/6 systolic murmur at the left sternal border; Respiratory:  clear to auscultation bilaterally, normal work of breathing GI: soft, nontender, nondistended, + BS Ext: without cyanosis, clubbing, or edema,  Good distal pulses bilaterally Psych: euthymic mood, full affect  Wt Readings from Last 3 Encounters:  09/03/16 175 lb 12.8 oz (79.7 kg)  08/23/16 173 lb (78.5 kg)  11/25/15 191 lb 1.9 oz (86.7 kg)      Studies/Labs Reviewed:   EKG:  EKG is  ordered today.  The ekg ordered today demonstrates Atrial fibrillation  Recent Labs: 08/23/2016: ALT 15; BUN 19; Creatinine, Ser 1.18; Hemoglobin 15.4; Platelets 160.0; Potassium 5.0; Sodium 137; TSH 1.66   Lipid Panel    Component Value Date/Time  CHOL 109 08/23/2016 0951   TRIG 129.0 08/23/2016 0951   HDL 31.80 (L) 08/23/2016 0951   CHOLHDL 3 08/23/2016 0951   VLDL 25.8 08/23/2016 0951   LDLCALC 52 08/23/2016 0951    Additional studies/ records that were reviewed today include:   2-D echo 09/2014 Study Conclusions   - Left ventricle: The cavity size was normal. Wall thickness was   normal. Systolic function was severely reduced. The estimated   ejection fraction was in the range of 25% to 30%. Diffuse   hypokinesis. The study is not technically sufficient to allow   evaluation of LV diastolic function. - Mitral valve: Mildly thickened leaflets . There was moderate   regurgitation. - Left atrium: Massively dilated at 62 ml/m2. - Right ventricle: The cavity size was normal. Wall thickness was   normal. Pacer wire or catheter noted in right ventricle. Systolic   function was normal. - Right atrium: The atrium was normal in size. Pacer wire or   catheter noted in right atrium. - Tricuspid valve: There was mild regurgitation. - Pulmonary arteries: PA peak pressure: 25 mm Hg (S). - Inferior vena cava: The vessel was dilated. The respirophasic   diameter changes were blunted (< 50%), consistent with elevated   central venous pressure.   Impressions:   - Compared to the prior study in 02/2014, there are no significant   changes.      ASSESSMENT:    1. Atrial fibrillation, unspecified type (Andrew Vance)   2. Gynecomastia, male   3.  Cardiomyopathy, unspecified type (Andrew Vance)   4. HYPERTENSION, BENIGN      PLAN:  In order of problems listed above:  Chronic, asked to most likely secondary to spironolactone. Discussed with pharmacist who recommends changing to Eplerenone 25 mg daily. Patient is agreeable and will start today.  Atrial fibrillation on Coreg, Lanoxin and Coumadin. Follow-up with Dr. Lovena Le in July  Cardiomyopathy LVEF 25-30% well compensated without evidence of heart failure  Hypertension well controlled    Medication Adjustments/Labs and Tests Ordered: Current medicines are reviewed at length with the patient today.  Concerns regarding medicines are outlined above.  Medication changes, Labs and Tests ordered today are listed in the Patient Instructions below. Patient Instructions  Medication Instructions:   START TAKING Eplerenone 25 MG ONCE A DAY    If you need a refill on your cardiac medications before your next appointment, please call your pharmacy.  Labwork:  NONE ORDERED  TODAY    Testing/Procedures: NONE ORDERED  TODAY    Follow-Up: IN July WITH DR Martinique    Any Other Special Instructions Will Be Listed Below (If Applicable).  Sumner Boast, PA-C  09/03/2016 1:06 PM    [facility] [address], [place], [place]  [address] Phone: [phone]; Fax: [phone]

## 2016-09-03 NOTE — Patient Instructions (Addendum)
Medication Instructions:   STOP TAKING SPIRONOLACTONE   START TAKING Eplerenone 25 MG ONCE A DAY    If you need a refill on your cardiac medications before your next appointment, please call your pharmacy.  Labwork:  NONE ORDERED  TODAY    Testing/Procedures: NONE ORDERED  TODAY    Follow-Up: IN July WITH DR  Lovena Le   Any Other Special Instructions Will Be Listed Below (If Applicable).

## 2016-09-05 DIAGNOSIS — R972 Elevated prostate specific antigen [PSA]: Secondary | ICD-10-CM | POA: Diagnosis not present

## 2016-09-05 DIAGNOSIS — N400 Enlarged prostate without lower urinary tract symptoms: Secondary | ICD-10-CM | POA: Diagnosis not present

## 2016-09-09 ENCOUNTER — Other Ambulatory Visit: Payer: Self-pay | Admitting: Family Medicine

## 2016-09-10 ENCOUNTER — Other Ambulatory Visit: Payer: Self-pay | Admitting: *Deleted

## 2016-09-10 NOTE — Telephone Encounter (Signed)
Can we refill this? 

## 2016-09-10 NOTE — Telephone Encounter (Signed)
Pharmacy requests a ninety day supply. 

## 2016-09-11 MED ORDER — WARFARIN SODIUM 5 MG PO TABS: ORAL_TABLET | ORAL | 1 refills | Status: DC

## 2016-09-25 DIAGNOSIS — M5136 Other intervertebral disc degeneration, lumbar region: Secondary | ICD-10-CM | POA: Diagnosis not present

## 2016-09-25 DIAGNOSIS — M9901 Segmental and somatic dysfunction of cervical region: Secondary | ICD-10-CM | POA: Diagnosis not present

## 2016-09-25 DIAGNOSIS — M5031 Other cervical disc degeneration,  high cervical region: Secondary | ICD-10-CM | POA: Diagnosis not present

## 2016-09-25 DIAGNOSIS — M9903 Segmental and somatic dysfunction of lumbar region: Secondary | ICD-10-CM | POA: Diagnosis not present

## 2016-09-26 DIAGNOSIS — M9903 Segmental and somatic dysfunction of lumbar region: Secondary | ICD-10-CM | POA: Diagnosis not present

## 2016-09-26 DIAGNOSIS — M5136 Other intervertebral disc degeneration, lumbar region: Secondary | ICD-10-CM | POA: Diagnosis not present

## 2016-09-26 DIAGNOSIS — M9901 Segmental and somatic dysfunction of cervical region: Secondary | ICD-10-CM | POA: Diagnosis not present

## 2016-09-26 DIAGNOSIS — M5031 Other cervical disc degeneration,  high cervical region: Secondary | ICD-10-CM | POA: Diagnosis not present

## 2016-09-27 DIAGNOSIS — M5136 Other intervertebral disc degeneration, lumbar region: Secondary | ICD-10-CM | POA: Diagnosis not present

## 2016-09-27 DIAGNOSIS — M5031 Other cervical disc degeneration,  high cervical region: Secondary | ICD-10-CM | POA: Diagnosis not present

## 2016-09-27 DIAGNOSIS — M9903 Segmental and somatic dysfunction of lumbar region: Secondary | ICD-10-CM | POA: Diagnosis not present

## 2016-09-27 DIAGNOSIS — M9901 Segmental and somatic dysfunction of cervical region: Secondary | ICD-10-CM | POA: Diagnosis not present

## 2016-09-28 ENCOUNTER — Ambulatory Visit (INDEPENDENT_AMBULATORY_CARE_PROVIDER_SITE_OTHER): Payer: PPO

## 2016-09-28 DIAGNOSIS — Z5181 Encounter for therapeutic drug level monitoring: Secondary | ICD-10-CM | POA: Diagnosis not present

## 2016-09-28 DIAGNOSIS — I4891 Unspecified atrial fibrillation: Secondary | ICD-10-CM | POA: Diagnosis not present

## 2016-09-28 LAB — POCT INR: INR: 3.6

## 2016-10-10 DIAGNOSIS — H2513 Age-related nuclear cataract, bilateral: Secondary | ICD-10-CM | POA: Diagnosis not present

## 2016-10-10 DIAGNOSIS — H3530 Unspecified macular degeneration: Secondary | ICD-10-CM | POA: Diagnosis not present

## 2016-10-10 DIAGNOSIS — H401132 Primary open-angle glaucoma, bilateral, moderate stage: Secondary | ICD-10-CM | POA: Diagnosis not present

## 2016-10-19 ENCOUNTER — Ambulatory Visit (INDEPENDENT_AMBULATORY_CARE_PROVIDER_SITE_OTHER): Payer: PPO

## 2016-10-19 DIAGNOSIS — I4891 Unspecified atrial fibrillation: Secondary | ICD-10-CM | POA: Diagnosis not present

## 2016-10-19 DIAGNOSIS — Z5181 Encounter for therapeutic drug level monitoring: Secondary | ICD-10-CM | POA: Diagnosis not present

## 2016-10-19 LAB — POCT INR: INR: 2.5

## 2016-10-23 ENCOUNTER — Ambulatory Visit (INDEPENDENT_AMBULATORY_CARE_PROVIDER_SITE_OTHER): Payer: PPO

## 2016-10-23 ENCOUNTER — Other Ambulatory Visit: Payer: Self-pay | Admitting: Family Medicine

## 2016-10-23 DIAGNOSIS — Z23 Encounter for immunization: Secondary | ICD-10-CM | POA: Diagnosis not present

## 2016-10-26 ENCOUNTER — Other Ambulatory Visit: Payer: Self-pay | Admitting: Physician Assistant

## 2016-10-26 MED ORDER — EPLERENONE 25 MG PO TABS: 25.0000 mg | ORAL_TABLET | Freq: Every day | ORAL | 3 refills | Status: DC

## 2016-11-06 ENCOUNTER — Other Ambulatory Visit: Payer: Self-pay | Admitting: *Deleted

## 2016-11-06 ENCOUNTER — Encounter: Payer: Self-pay | Admitting: *Deleted

## 2016-11-06 NOTE — Patient Outreach (Signed)
HTA THN Screen. I spoke with patient's wife today who is his HCPOA. She reports [name] is still working. Pt sees primary care MD routinely. Takes medications as ordered for chronic problems: AFIB, CHF, HTN, Glaucoma. No acute health concerns. No care management needs. Introductory letter sent.  Eulah Pont. Myrtie Neither, MSN, [facility] Nurse Practitioner [facility] Care Management [phone]

## 2016-11-16 ENCOUNTER — Ambulatory Visit (INDEPENDENT_AMBULATORY_CARE_PROVIDER_SITE_OTHER): Payer: PPO | Admitting: *Deleted

## 2016-11-16 DIAGNOSIS — Z5181 Encounter for therapeutic drug level monitoring: Secondary | ICD-10-CM | POA: Diagnosis not present

## 2016-11-16 DIAGNOSIS — I4891 Unspecified atrial fibrillation: Secondary | ICD-10-CM | POA: Diagnosis not present

## 2016-11-16 LAB — POCT INR: INR: 2.2

## 2016-11-19 DIAGNOSIS — M9904 Segmental and somatic dysfunction of sacral region: Secondary | ICD-10-CM | POA: Diagnosis not present

## 2016-11-19 DIAGNOSIS — M5136 Other intervertebral disc degeneration, lumbar region: Secondary | ICD-10-CM | POA: Diagnosis not present

## 2016-11-19 DIAGNOSIS — M9905 Segmental and somatic dysfunction of pelvic region: Secondary | ICD-10-CM | POA: Diagnosis not present

## 2016-11-19 DIAGNOSIS — M9903 Segmental and somatic dysfunction of lumbar region: Secondary | ICD-10-CM | POA: Diagnosis not present

## 2016-11-20 ENCOUNTER — Ambulatory Visit (INDEPENDENT_AMBULATORY_CARE_PROVIDER_SITE_OTHER): Payer: PPO | Admitting: Internal Medicine

## 2016-11-20 ENCOUNTER — Encounter: Payer: Self-pay | Admitting: Internal Medicine

## 2016-11-20 VITALS — BP 120/64 | HR 78 | Ht 68.0 in | Wt 165.0 lb

## 2016-11-20 DIAGNOSIS — I4891 Unspecified atrial fibrillation: Secondary | ICD-10-CM | POA: Diagnosis not present

## 2016-11-20 DIAGNOSIS — I495 Sick sinus syndrome: Secondary | ICD-10-CM | POA: Diagnosis not present

## 2016-11-20 NOTE — Progress Notes (Signed)
HPI Andrew Vance returns today for followup. He is a pleasant 81 yo man with a h/o atrial fib, symptomatic bradycardia and LBBB. He is s/p biV ICD and  paces 50% of the time. He has class 2 CHF symptoms. His EF was 15-20% initially then 25-30% by repeat echo. He has been on a beta blocker and ACE inhibitor and with initiation of lasix his dyspnea has resolved and his peripheral edema improved. He denies chest pain.  Allergies  Allergen Reactions  . Zithromax [Azithromycin] Other (See Comments)    Affects Coumadin levels      Current Outpatient Prescriptions  Medication Sig Dispense Refill  . acetaminophen (TYLENOL) 325 MG tablet Take 650 mg by mouth 2 (two) times daily as needed (pain).    Marland Kitchen amoxicillin (AMOXIL) 500 MG tablet Take 2,000 mg by mouth as directed. Prior to dentist appointment    . brimonidine (ALPHAGAN) 0.2 % ophthalmic solution Place 1 drop into both eyes 2 (two) times daily.    . carvedilol (COREG) 3.125 MG tablet TAKE 1 TABLET BY MOUTH 2 TIMES DAILY WITH A MEAL 60 tablet 11  . digoxin (LANOXIN) 0.125 MG tablet TAKE 1 TABLET (0.125 MG TOTAL) BY MOUTH DAILY. 90 tablet 2  . eplerenone (INSPRA) 25 MG tablet Take 1 tablet (25 mg total) by mouth daily. 90 tablet 3  . furosemide (LASIX) 40 MG tablet TAKE 1 TABLET (40 MG TOTAL) BY MOUTH DAILY. 30 tablet 1  . hydrocortisone cream 1 % Apply 1 application topically daily as needed for itching. Reported on 06/20/2015    . latanoprost (XALATAN) 0.005 % ophthalmic solution Place 1 drop into both eyes at bedtime.    . Lutein 20 MG CAPS Take 20 mg by mouth at bedtime.     . rosuvastatin (CRESTOR) 10 MG tablet Take 1 tablet (10 mg total) by mouth daily. 90 tablet 3  . timolol (BETIMOL) 0.5 % ophthalmic solution 1 drop 2 (two) times daily. As directed    . warfarin (COUMADIN) 5 MG tablet Take as directed by coumadin clinic 100 tablet 1   No current facility-administered medications for this visit.      Past Medical History:    Diagnosis Date  . AICD (automatic cardioverter/defibrillator) present   . Anemia, iron deficiency    hx  . Arthritis    "all over"  . Atrial fibrillation [facility])    sees Andrew Vance  . Bronchial pneumonia X 3 "when I was a kid"  . Complication of anesthesia 1976   BP elevated with spinal;  "I was still in the service when that happened"  . GERD (gastroesophageal reflux disease)   . GI bleed    hx  . H/O hiatal hernia   . Hyperlipidemia   . Hypertension   . Psoriatic arthritis [facility])    sees Andrew Vance   . Tachycardia-bradycardia syndrome ([place])     ROS:   All systems reviewed and negative except as noted in the HPI.   Past Surgical History:  Procedure Laterality Date  . APPENDECTOMY    . CARDIAC CATHETERIZATION  09/10/08  . CARDIAC DEFIBRILLATOR PLACEMENT  11/18/2014  . COLONOSCOPY  05-27-10   per Dr, Ardis Vance, extensive diverticulosis, repeat in 10 yrs   . EP IMPLANTABLE DEVICE N/A 11/18/2014   Procedure: BiV ICD Upgrade;  Surgeon: Andrew Lance, MD;  Location: [place] [facility];  Service: Cardiovascular;  Laterality: N/A;  . ESOPHAGOGASTRODUODENOSCOPY  05-27-10   per Dr.  Ardis Vance, normal   . HAND SURGERY Left 1978   lft hand, fusions to DIPs and PIPs of fingers 1,2,4,and 5  . INSERT / REPLACE / REMOVE PACEMAKER  2010  . JOINT REPLACEMENT    . PILONIDAL CYST EXCISION  1961  . TOTAL HIP ARTHROPLASTY Bilateral 1998 `2001   bilateral, sees Andrew Vance   . TOTAL HIP REVISION  02/25/2012   Procedure: TOTAL HIP REVISION;  Surgeon: Kerin Salen, MD;  Location: [place];  Service: Orthopedics;  Laterality: Left;     Family History  Problem Relation Age of Onset  . Hypertension Other   . Heart disease Other      Social History   Social History  . Marital status: Married    Spouse name: N/A  . Number of children: N/A  . Years of education: N/A   Occupational History  . Not on file.   Social History Main Topics  . Smoking status: Former Smoker     Packs/day: 1.00    Years: 30.00    Types: Cigarettes, Pipe    Quit date: 02/17/1982  . Smokeless tobacco: Former Systems developer    Quit date: 05/01/1983  . Alcohol use 1.8 oz/week    1 Standard drinks or equivalent, 2 Shots of liquor per week  . Drug use: No  . Sexual activity: Yes   Other Topics Concern  . Not on file   Social History Narrative  . No narrative on file     BP 120/64   Pulse 78   Ht 5\' 8"  (1.727 m)   Wt 165 lb (74.8 kg)   SpO2 99%   BMI 25.09 kg/m   Physical Exam:  Well appearing 81 yo man, NAD HEENT: Unremarkable Neck:  7 cm JVD, no thyromegally Back:  No CVA tenderness Lungs:  Clear with basilar rales HEART:  Regular rate rhythm, no murmurs, no rubs, no clicks Abd:  soft, positive bowel sounds, no organomegally, no rebound, no guarding Ext:  2 plus pulses, no edema, no cyanosis, no clubbing Skin:  No rashes no nodules Neuro:  CN II through XII intact, motor grossly intact   DEVICE  Normal device function.  See PaceArt for details.   Assess/Plan: 1. Chronic systolic heart failure - his symptoms remain class 2. He will continue his current meds and I have encouraged him to maintain a low sodium diet. 2. Atrial fib - his ventricular rate is a bit better but still not pacing as much as we would like. I considered AV node ablation but clinically, his situation would not support this. 3. BiV ICD - his device is working normally. Will recheck in several months.  Andrew Vance.D.

## 2016-11-20 NOTE — Patient Instructions (Signed)
Medication Instructions:  Your physician recommends that you continue on your current medications as directed. Please refer to the Current Medication list given to you today.   Labwork: None ordered.   Testing/Procedures: None ordered.   Follow-Up: Your physician wants you to follow-up in: one year with Dr. Lovena Le.   You will receive a reminder letter in the mail two months in advance. If you don't receive a letter, please call our office to schedule the follow-up appointment.  Remote monitoring is used to monitor your ICD from home. This monitoring reduces the number of office visits required to check your device to one time per year. It allows [place] to keep an eye on the functioning of your device to ensure it is working properly. You are scheduled for a device check from home on 11/26/2016. You may send your transmission at any time that day. If you have a wireless device, the transmission will be sent automatically. After your physician reviews your transmission, you will receive a postcard with your next transmission date.    Any Other Special Instructions Will Be Listed Below (If Applicable).     If you need a refill on your cardiac medications before your next appointment, please call your pharmacy.

## 2016-11-25 ENCOUNTER — Other Ambulatory Visit: Payer: Self-pay | Admitting: Internal Medicine

## 2016-11-26 ENCOUNTER — Encounter: Payer: PPO | Admitting: *Deleted

## 2016-11-26 ENCOUNTER — Telehealth: Payer: Self-pay | Admitting: Cardiology

## 2016-11-26 LAB — CUP PACEART INCLINIC DEVICE CHECK
Battery Remaining Longevity: 73 mo
Brady Statistic RA Percent Paced: 0 %
Brady Statistic RV Percent Paced: 51 %
Date Time Interrogation Session: 20180724130511
HighPow Impedance: 65.25 Ohm
Implantable Lead Implant Date: 20100521
Implantable Lead Location: 753858
Implantable Lead Location: 753859
Implantable Pulse Generator Implant Date: 20160721
Lead Channel Impedance Value: 412.5 Ohm
Lead Channel Impedance Value: 700 Ohm
Lead Channel Pacing Threshold Pulse Width: 0.5 ms
Lead Channel Sensing Intrinsic Amplitude: 0.8 mV
MDC IDC LEAD IMPLANT DT: 20160721
MDC IDC LEAD IMPLANT DT: 20160721
MDC IDC LEAD LOCATION: 753860
MDC IDC MSMT LEADCHNL LV IMPEDANCE VALUE: 775 Ohm
MDC IDC MSMT LEADCHNL LV PACING THRESHOLD AMPLITUDE: 1.75 V
MDC IDC MSMT LEADCHNL RV PACING THRESHOLD AMPLITUDE: 1.5 V
MDC IDC MSMT LEADCHNL RV PACING THRESHOLD PULSEWIDTH: 0.5 ms
MDC IDC MSMT LEADCHNL RV SENSING INTR AMPL: 8.1 mV
MDC IDC SET LEADCHNL LV PACING AMPLITUDE: 2.5 V
MDC IDC SET LEADCHNL LV PACING PULSEWIDTH: 0.5 ms
MDC IDC SET LEADCHNL RV PACING AMPLITUDE: 2.5 V
MDC IDC SET LEADCHNL RV PACING PULSEWIDTH: 0.5 ms
MDC IDC SET LEADCHNL RV SENSING SENSITIVITY: 0.5 mV
Pulse Gen Serial Number: 7289863

## 2016-11-26 NOTE — Telephone Encounter (Signed)
LMOVM reminding pt to send remote transmission.   

## 2016-11-30 ENCOUNTER — Telehealth: Payer: Self-pay | Admitting: Internal Medicine

## 2016-11-30 ENCOUNTER — Encounter: Payer: Self-pay | Admitting: Cardiology

## 2016-11-30 ENCOUNTER — Ambulatory Visit (INDEPENDENT_AMBULATORY_CARE_PROVIDER_SITE_OTHER): Payer: PPO | Admitting: *Deleted

## 2016-11-30 DIAGNOSIS — I429 Cardiomyopathy, unspecified: Secondary | ICD-10-CM

## 2016-11-30 NOTE — Telephone Encounter (Signed)
New message    Pt is calling about his transmission. Please call.

## 2016-11-30 NOTE — Telephone Encounter (Signed)
Pt stated that he got a letter than his transmission was not received. Pt stated that he pressed the button on his home monitor once and it went through, pt stated that he doesn't leave the monitor plugged in all the time. Informed pt that he should leave the monitor plugged in and in order to send a manual transmission, he would need to press the button twice.  Pt stated that he would do this.

## 2016-11-30 NOTE — Telephone Encounter (Signed)
Transmission received.

## 2016-12-04 ENCOUNTER — Encounter: Payer: Self-pay | Admitting: Cardiology

## 2016-12-04 NOTE — Progress Notes (Signed)
Remote ICD transmission.   

## 2016-12-14 ENCOUNTER — Ambulatory Visit (INDEPENDENT_AMBULATORY_CARE_PROVIDER_SITE_OTHER): Payer: PPO

## 2016-12-14 DIAGNOSIS — I4891 Unspecified atrial fibrillation: Secondary | ICD-10-CM | POA: Diagnosis not present

## 2016-12-14 DIAGNOSIS — Z5181 Encounter for therapeutic drug level monitoring: Secondary | ICD-10-CM

## 2016-12-14 LAB — POCT INR: INR: 2.9

## 2016-12-20 ENCOUNTER — Other Ambulatory Visit: Payer: Self-pay | Admitting: Family Medicine

## 2016-12-21 NOTE — Telephone Encounter (Signed)
Can we refill this? 

## 2017-01-01 LAB — CUP PACEART REMOTE DEVICE CHECK
Battery Remaining Longevity: 68 mo
Battery Voltage: 2.98 V
HIGH POWER IMPEDANCE MEASURED VALUE: 64 Ohm
HIGH POWER IMPEDANCE MEASURED VALUE: 65 Ohm
Implantable Lead Implant Date: 20100521
Implantable Lead Implant Date: 20160721
Implantable Lead Location: 753858
Implantable Lead Location: 753860
Implantable Lead Model: 7122
Lead Channel Impedance Value: 780 Ohm
Lead Channel Pacing Threshold Amplitude: 1.5 V
Lead Channel Pacing Threshold Pulse Width: 0.5 ms
Lead Channel Sensing Intrinsic Amplitude: 9.8 mV
Lead Channel Setting Pacing Amplitude: 2.5 V
Lead Channel Setting Pacing Pulse Width: 0.5 ms
Lead Channel Setting Pacing Pulse Width: 0.5 ms
Lead Channel Setting Sensing Sensitivity: 0.5 mV
MDC IDC LEAD IMPLANT DT: 20160721
MDC IDC LEAD LOCATION: 753859
MDC IDC MSMT BATTERY REMAINING PERCENTAGE: 74 %
MDC IDC MSMT LEADCHNL LV PACING THRESHOLD AMPLITUDE: 1.75 V
MDC IDC MSMT LEADCHNL RA IMPEDANCE VALUE: 400 Ohm
MDC IDC MSMT LEADCHNL RA SENSING INTR AMPL: 0.8 mV
MDC IDC MSMT LEADCHNL RV IMPEDANCE VALUE: 660 Ohm
MDC IDC MSMT LEADCHNL RV PACING THRESHOLD PULSEWIDTH: 0.5 ms
MDC IDC PG IMPLANT DT: 20160721
MDC IDC SESS DTM: 20180803211101
MDC IDC SET LEADCHNL LV PACING AMPLITUDE: 2.5 V
Pulse Gen Serial Number: 7289863

## 2017-01-05 ENCOUNTER — Encounter: Payer: Self-pay | Admitting: Family Medicine

## 2017-01-09 ENCOUNTER — Telehealth: Payer: Self-pay | Admitting: Family Medicine

## 2017-01-09 DIAGNOSIS — H2513 Age-related nuclear cataract, bilateral: Secondary | ICD-10-CM | POA: Diagnosis not present

## 2017-01-09 DIAGNOSIS — H35353 Cystoid macular degeneration, bilateral: Secondary | ICD-10-CM | POA: Diagnosis not present

## 2017-01-09 DIAGNOSIS — H401132 Primary open-angle glaucoma, bilateral, moderate stage: Secondary | ICD-10-CM | POA: Diagnosis not present

## 2017-01-09 NOTE — Telephone Encounter (Signed)
Updated flu vaccine.

## 2017-01-17 ENCOUNTER — Encounter: Payer: Self-pay | Admitting: Family Medicine

## 2017-01-25 ENCOUNTER — Ambulatory Visit (INDEPENDENT_AMBULATORY_CARE_PROVIDER_SITE_OTHER): Payer: PPO

## 2017-01-25 DIAGNOSIS — I4891 Unspecified atrial fibrillation: Secondary | ICD-10-CM

## 2017-01-25 DIAGNOSIS — Z5181 Encounter for therapeutic drug level monitoring: Secondary | ICD-10-CM | POA: Diagnosis not present

## 2017-01-25 LAB — POCT INR: INR: 2

## 2017-03-04 ENCOUNTER — Ambulatory Visit (INDEPENDENT_AMBULATORY_CARE_PROVIDER_SITE_OTHER): Payer: PPO | Admitting: *Deleted

## 2017-03-04 DIAGNOSIS — I429 Cardiomyopathy, unspecified: Secondary | ICD-10-CM

## 2017-03-05 DIAGNOSIS — R972 Elevated prostate specific antigen [PSA]: Secondary | ICD-10-CM | POA: Diagnosis not present

## 2017-03-05 NOTE — Progress Notes (Signed)
Remote ICD transmission.   

## 2017-03-06 ENCOUNTER — Encounter: Payer: Self-pay | Admitting: Cardiology

## 2017-03-08 ENCOUNTER — Ambulatory Visit (INDEPENDENT_AMBULATORY_CARE_PROVIDER_SITE_OTHER): Payer: PPO | Admitting: *Deleted

## 2017-03-08 DIAGNOSIS — I4891 Unspecified atrial fibrillation: Secondary | ICD-10-CM

## 2017-03-08 DIAGNOSIS — Z5181 Encounter for therapeutic drug level monitoring: Secondary | ICD-10-CM | POA: Diagnosis not present

## 2017-03-08 LAB — POCT INR: INR: 2.4

## 2017-03-12 DIAGNOSIS — R972 Elevated prostate specific antigen [PSA]: Secondary | ICD-10-CM | POA: Diagnosis not present

## 2017-03-19 LAB — CUP PACEART REMOTE DEVICE CHECK
Battery Voltage: 2.98 V
Date Time Interrogation Session: 20181105213217
HIGH POWER IMPEDANCE MEASURED VALUE: 66 Ohm
HIGH POWER IMPEDANCE MEASURED VALUE: 66 Ohm
Implantable Lead Implant Date: 20100521
Implantable Lead Implant Date: 20160721
Implantable Lead Implant Date: 20160721
Implantable Lead Location: 753858
Lead Channel Impedance Value: 440 Ohm
Lead Channel Impedance Value: 540 Ohm
Lead Channel Impedance Value: 740 Ohm
Lead Channel Sensing Intrinsic Amplitude: 0.8 mV
Lead Channel Setting Pacing Amplitude: 2.5 V
Lead Channel Setting Pacing Amplitude: 2.5 V
Lead Channel Setting Pacing Pulse Width: 0.5 ms
MDC IDC LEAD LOCATION: 753859
MDC IDC LEAD LOCATION: 753860
MDC IDC MSMT BATTERY REMAINING LONGEVITY: 65 mo
MDC IDC MSMT BATTERY REMAINING PERCENTAGE: 72 %
MDC IDC MSMT LEADCHNL LV PACING THRESHOLD AMPLITUDE: 1.75 V
MDC IDC MSMT LEADCHNL LV PACING THRESHOLD PULSEWIDTH: 0.5 ms
MDC IDC MSMT LEADCHNL RV PACING THRESHOLD AMPLITUDE: 1.5 V
MDC IDC MSMT LEADCHNL RV PACING THRESHOLD PULSEWIDTH: 0.5 ms
MDC IDC MSMT LEADCHNL RV SENSING INTR AMPL: 12 mV
MDC IDC PG IMPLANT DT: 20160721
MDC IDC SET LEADCHNL LV PACING PULSEWIDTH: 0.5 ms
MDC IDC SET LEADCHNL RV SENSING SENSITIVITY: 0.5 mV
Pulse Gen Serial Number: 7289863

## 2017-04-10 ENCOUNTER — Other Ambulatory Visit: Payer: Self-pay | Admitting: Internal Medicine

## 2017-04-10 DIAGNOSIS — H3530 Unspecified macular degeneration: Secondary | ICD-10-CM | POA: Diagnosis not present

## 2017-04-10 DIAGNOSIS — H401132 Primary open-angle glaucoma, bilateral, moderate stage: Secondary | ICD-10-CM | POA: Diagnosis not present

## 2017-04-10 DIAGNOSIS — H2513 Age-related nuclear cataract, bilateral: Secondary | ICD-10-CM | POA: Diagnosis not present

## 2017-04-12 DIAGNOSIS — M71571 Other bursitis, not elsewhere classified, right ankle and foot: Secondary | ICD-10-CM | POA: Diagnosis not present

## 2017-04-12 DIAGNOSIS — M7661 Achilles tendinitis, right leg: Secondary | ICD-10-CM | POA: Diagnosis not present

## 2017-04-12 DIAGNOSIS — M7731 Calcaneal spur, right foot: Secondary | ICD-10-CM | POA: Diagnosis not present

## 2017-04-19 ENCOUNTER — Ambulatory Visit (INDEPENDENT_AMBULATORY_CARE_PROVIDER_SITE_OTHER): Payer: PPO | Admitting: *Deleted

## 2017-04-19 DIAGNOSIS — Z5181 Encounter for therapeutic drug level monitoring: Secondary | ICD-10-CM

## 2017-04-19 DIAGNOSIS — I4891 Unspecified atrial fibrillation: Secondary | ICD-10-CM | POA: Diagnosis not present

## 2017-04-19 DIAGNOSIS — M7661 Achilles tendinitis, right leg: Secondary | ICD-10-CM | POA: Diagnosis not present

## 2017-04-19 DIAGNOSIS — M71571 Other bursitis, not elsewhere classified, right ankle and foot: Secondary | ICD-10-CM | POA: Diagnosis not present

## 2017-04-19 LAB — POCT INR: INR: 2.3

## 2017-04-19 NOTE — Patient Instructions (Signed)
Description   Continue on same dosage 1 tablet everyday. Recheck INR in 6 weeks. Call us with medication updates or new/changes. [phone]     

## 2017-05-31 ENCOUNTER — Ambulatory Visit (INDEPENDENT_AMBULATORY_CARE_PROVIDER_SITE_OTHER): Payer: PPO | Admitting: *Deleted

## 2017-05-31 DIAGNOSIS — Z5181 Encounter for therapeutic drug level monitoring: Secondary | ICD-10-CM

## 2017-05-31 LAB — POCT INR: INR: 3

## 2017-05-31 NOTE — Patient Instructions (Signed)
Description   Continue on same dosage 1 tablet everyday. Recheck INR in 6 weeks. Call us with medication updates or new/changes. [phone]     

## 2017-06-03 ENCOUNTER — Ambulatory Visit (INDEPENDENT_AMBULATORY_CARE_PROVIDER_SITE_OTHER): Payer: PPO | Admitting: *Deleted

## 2017-06-03 ENCOUNTER — Telehealth: Payer: Self-pay | Admitting: Cardiology

## 2017-06-03 DIAGNOSIS — I495 Sick sinus syndrome: Secondary | ICD-10-CM | POA: Diagnosis not present

## 2017-06-03 NOTE — Telephone Encounter (Signed)
Confirmed remote transmission w/ pt wife.   

## 2017-06-04 ENCOUNTER — Encounter: Payer: Self-pay | Admitting: Cardiology

## 2017-06-04 NOTE — Progress Notes (Signed)
Letter  

## 2017-06-04 NOTE — Progress Notes (Signed)
Remote ICD transmission.   

## 2017-06-29 LAB — CUP PACEART REMOTE DEVICE CHECK
Battery Voltage: 2.96 V
HighPow Impedance: 68 Ohm
HighPow Impedance: 68 Ohm
Implantable Lead Implant Date: 20160721
Implantable Lead Location: 753859
Implantable Lead Location: 753860
Implantable Lead Model: 7122
Implantable Pulse Generator Implant Date: 20160721
Lead Channel Pacing Threshold Amplitude: 1.5 V
Lead Channel Pacing Threshold Amplitude: 1.75 V
Lead Channel Pacing Threshold Pulse Width: 0.5 ms
Lead Channel Pacing Threshold Pulse Width: 0.5 ms
Lead Channel Sensing Intrinsic Amplitude: 0.8 mV
Lead Channel Sensing Intrinsic Amplitude: 6.7 mV
Lead Channel Setting Pacing Pulse Width: 0.5 ms
Lead Channel Setting Sensing Sensitivity: 0.5 mV
MDC IDC LEAD IMPLANT DT: 20100521
MDC IDC LEAD IMPLANT DT: 20160721
MDC IDC LEAD LOCATION: 753858
MDC IDC MSMT BATTERY REMAINING LONGEVITY: 62 mo
MDC IDC MSMT BATTERY REMAINING PERCENTAGE: 69 %
MDC IDC MSMT LEADCHNL LV IMPEDANCE VALUE: 730 Ohm
MDC IDC MSMT LEADCHNL RA IMPEDANCE VALUE: 390 Ohm
MDC IDC MSMT LEADCHNL RV IMPEDANCE VALUE: 610 Ohm
MDC IDC PG SERIAL: 7289863
MDC IDC SESS DTM: 20190204205236
MDC IDC SET LEADCHNL LV PACING AMPLITUDE: 2.5 V
MDC IDC SET LEADCHNL RV PACING AMPLITUDE: 2.5 V
MDC IDC SET LEADCHNL RV PACING PULSEWIDTH: 0.5 ms

## 2017-07-10 DIAGNOSIS — H401132 Primary open-angle glaucoma, bilateral, moderate stage: Secondary | ICD-10-CM | POA: Diagnosis not present

## 2017-07-10 DIAGNOSIS — H2513 Age-related nuclear cataract, bilateral: Secondary | ICD-10-CM | POA: Diagnosis not present

## 2017-07-10 DIAGNOSIS — H3530 Unspecified macular degeneration: Secondary | ICD-10-CM | POA: Diagnosis not present

## 2017-07-11 ENCOUNTER — Ambulatory Visit: Payer: PPO | Admitting: Pharmacist

## 2017-07-11 DIAGNOSIS — Z5181 Encounter for therapeutic drug level monitoring: Secondary | ICD-10-CM

## 2017-07-11 LAB — POCT INR: INR: 1.9

## 2017-07-11 NOTE — Patient Instructions (Signed)
Description   Take 1.5 tablets today, then continue on same dosage 1 tablet everyday. Recheck INR in 5 weeks. Call us with medication updates or new/changes. [phone] [id number]

## 2017-07-22 ENCOUNTER — Encounter: Payer: Self-pay | Admitting: Family Medicine

## 2017-07-26 DIAGNOSIS — Z85828 Personal history of other malignant neoplasm of skin: Secondary | ICD-10-CM | POA: Diagnosis not present

## 2017-07-26 DIAGNOSIS — L400 Psoriasis vulgaris: Secondary | ICD-10-CM | POA: Diagnosis not present

## 2017-07-26 DIAGNOSIS — L570 Actinic keratosis: Secondary | ICD-10-CM | POA: Diagnosis not present

## 2017-07-26 DIAGNOSIS — L821 Other seborrheic keratosis: Secondary | ICD-10-CM | POA: Diagnosis not present

## 2017-08-08 DIAGNOSIS — M9905 Segmental and somatic dysfunction of pelvic region: Secondary | ICD-10-CM | POA: Diagnosis not present

## 2017-08-08 DIAGNOSIS — M5136 Other intervertebral disc degeneration, lumbar region: Secondary | ICD-10-CM | POA: Diagnosis not present

## 2017-08-08 DIAGNOSIS — M9903 Segmental and somatic dysfunction of lumbar region: Secondary | ICD-10-CM | POA: Diagnosis not present

## 2017-08-08 DIAGNOSIS — M9904 Segmental and somatic dysfunction of sacral region: Secondary | ICD-10-CM | POA: Diagnosis not present

## 2017-08-09 DIAGNOSIS — H401112 Primary open-angle glaucoma, right eye, moderate stage: Secondary | ICD-10-CM | POA: Diagnosis not present

## 2017-08-09 DIAGNOSIS — H2513 Age-related nuclear cataract, bilateral: Secondary | ICD-10-CM | POA: Diagnosis not present

## 2017-08-09 DIAGNOSIS — H401123 Primary open-angle glaucoma, left eye, severe stage: Secondary | ICD-10-CM | POA: Diagnosis not present

## 2017-08-13 DIAGNOSIS — M9905 Segmental and somatic dysfunction of pelvic region: Secondary | ICD-10-CM | POA: Diagnosis not present

## 2017-08-13 DIAGNOSIS — M9903 Segmental and somatic dysfunction of lumbar region: Secondary | ICD-10-CM | POA: Diagnosis not present

## 2017-08-13 DIAGNOSIS — M9904 Segmental and somatic dysfunction of sacral region: Secondary | ICD-10-CM | POA: Diagnosis not present

## 2017-08-13 DIAGNOSIS — M5136 Other intervertebral disc degeneration, lumbar region: Secondary | ICD-10-CM | POA: Diagnosis not present

## 2017-08-15 ENCOUNTER — Ambulatory Visit (INDEPENDENT_AMBULATORY_CARE_PROVIDER_SITE_OTHER): Payer: PPO | Admitting: *Deleted

## 2017-08-15 ENCOUNTER — Other Ambulatory Visit: Payer: Self-pay | Admitting: Family Medicine

## 2017-08-15 DIAGNOSIS — Z5181 Encounter for therapeutic drug level monitoring: Secondary | ICD-10-CM | POA: Diagnosis not present

## 2017-08-15 DIAGNOSIS — I4891 Unspecified atrial fibrillation: Secondary | ICD-10-CM | POA: Diagnosis not present

## 2017-08-15 LAB — POCT INR: INR: 4.4

## 2017-08-15 NOTE — Patient Instructions (Signed)
Description   Skip today's dose, tomorrow only take 1/2 tablet, then continue on same dosage 1 tablet everyday. Recheck INR in 2 weeks. Call us with medication updates or new/changes. [phone] [id number]

## 2017-08-26 DIAGNOSIS — I10 Essential (primary) hypertension: Secondary | ICD-10-CM | POA: Diagnosis not present

## 2017-08-26 DIAGNOSIS — Z79899 Other long term (current) drug therapy: Secondary | ICD-10-CM | POA: Diagnosis not present

## 2017-08-26 DIAGNOSIS — Z7901 Long term (current) use of anticoagulants: Secondary | ICD-10-CM | POA: Diagnosis not present

## 2017-08-26 DIAGNOSIS — Z87891 Personal history of nicotine dependence: Secondary | ICD-10-CM | POA: Diagnosis not present

## 2017-08-26 DIAGNOSIS — I428 Other cardiomyopathies: Secondary | ICD-10-CM | POA: Diagnosis not present

## 2017-08-26 DIAGNOSIS — M170 Bilateral primary osteoarthritis of knee: Secondary | ICD-10-CM | POA: Diagnosis not present

## 2017-08-26 DIAGNOSIS — M19012 Primary osteoarthritis, left shoulder: Secondary | ICD-10-CM | POA: Diagnosis not present

## 2017-08-26 DIAGNOSIS — M19011 Primary osteoarthritis, right shoulder: Secondary | ICD-10-CM | POA: Diagnosis not present

## 2017-08-26 DIAGNOSIS — I4891 Unspecified atrial fibrillation: Secondary | ICD-10-CM | POA: Diagnosis not present

## 2017-08-26 DIAGNOSIS — H2512 Age-related nuclear cataract, left eye: Secondary | ICD-10-CM | POA: Diagnosis not present

## 2017-08-26 DIAGNOSIS — H401123 Primary open-angle glaucoma, left eye, severe stage: Secondary | ICD-10-CM | POA: Diagnosis not present

## 2017-08-26 DIAGNOSIS — Z950 Presence of cardiac pacemaker: Secondary | ICD-10-CM | POA: Diagnosis not present

## 2017-08-30 ENCOUNTER — Ambulatory Visit: Payer: PPO | Admitting: *Deleted

## 2017-08-30 DIAGNOSIS — I4891 Unspecified atrial fibrillation: Secondary | ICD-10-CM | POA: Diagnosis not present

## 2017-08-30 DIAGNOSIS — Z5181 Encounter for therapeutic drug level monitoring: Secondary | ICD-10-CM | POA: Diagnosis not present

## 2017-08-30 LAB — POCT INR: INR: 1.9

## 2017-08-30 NOTE — Patient Instructions (Signed)
Description   Today take 1.5 tablets then continue on same dosage 1 tablet everyday. Recheck INR in 2 weeks. Call us with medication updates or new/changes. [phone] [id number]

## 2017-09-02 ENCOUNTER — Ambulatory Visit (INDEPENDENT_AMBULATORY_CARE_PROVIDER_SITE_OTHER): Payer: PPO | Admitting: *Deleted

## 2017-09-02 DIAGNOSIS — I429 Cardiomyopathy, unspecified: Secondary | ICD-10-CM

## 2017-09-03 NOTE — Progress Notes (Signed)
Remote ICD transmission.   

## 2017-09-04 ENCOUNTER — Encounter: Payer: Self-pay | Admitting: Cardiology

## 2017-09-12 ENCOUNTER — Ambulatory Visit: Payer: PPO | Admitting: *Deleted

## 2017-09-12 DIAGNOSIS — I4891 Unspecified atrial fibrillation: Secondary | ICD-10-CM | POA: Diagnosis not present

## 2017-09-12 DIAGNOSIS — Z5181 Encounter for therapeutic drug level monitoring: Secondary | ICD-10-CM

## 2017-09-12 LAB — POCT INR: INR: 2.8

## 2017-09-12 NOTE — Patient Instructions (Signed)
Description   Continue on same dosage 1 tablet everyday. Recheck INR in 3 weeks. Call us with medication updates or new/changes. [phone] [id number]

## 2017-09-16 LAB — CUP PACEART REMOTE DEVICE CHECK
Battery Remaining Longevity: 59 mo
Date Time Interrogation Session: 20190506175810
HighPow Impedance: 68 Ohm
HighPow Impedance: 68 Ohm
Implantable Lead Implant Date: 20160721
Implantable Lead Location: 753859
Implantable Lead Location: 753860
Implantable Lead Model: 7122
Lead Channel Impedance Value: 750 Ohm
Lead Channel Pacing Threshold Amplitude: 1.5 V
Lead Channel Pacing Threshold Amplitude: 1.75 V
Lead Channel Pacing Threshold Pulse Width: 0.5 ms
Lead Channel Sensing Intrinsic Amplitude: 0.8 mV
Lead Channel Setting Pacing Amplitude: 2.5 V
Lead Channel Setting Pacing Pulse Width: 0.5 ms
Lead Channel Setting Sensing Sensitivity: 0.5 mV
MDC IDC LEAD IMPLANT DT: 20100521
MDC IDC LEAD IMPLANT DT: 20160721
MDC IDC LEAD LOCATION: 753858
MDC IDC MSMT BATTERY REMAINING PERCENTAGE: 66 %
MDC IDC MSMT BATTERY VOLTAGE: 2.96 V
MDC IDC MSMT LEADCHNL RA IMPEDANCE VALUE: 410 Ohm
MDC IDC MSMT LEADCHNL RV IMPEDANCE VALUE: 480 Ohm
MDC IDC MSMT LEADCHNL RV PACING THRESHOLD PULSEWIDTH: 0.5 ms
MDC IDC MSMT LEADCHNL RV SENSING INTR AMPL: 6.9 mV
MDC IDC PG IMPLANT DT: 20160721
MDC IDC PG SERIAL: 7289863
MDC IDC SET LEADCHNL LV PACING AMPLITUDE: 2.5 V
MDC IDC SET LEADCHNL RV PACING PULSEWIDTH: 0.5 ms

## 2017-09-24 DIAGNOSIS — H401123 Primary open-angle glaucoma, left eye, severe stage: Secondary | ICD-10-CM | POA: Diagnosis not present

## 2017-09-30 ENCOUNTER — Encounter: Payer: Self-pay | Admitting: Family Medicine

## 2017-10-03 ENCOUNTER — Ambulatory Visit: Payer: PPO | Admitting: *Deleted

## 2017-10-03 DIAGNOSIS — I4891 Unspecified atrial fibrillation: Secondary | ICD-10-CM | POA: Diagnosis not present

## 2017-10-03 DIAGNOSIS — Z5181 Encounter for therapeutic drug level monitoring: Secondary | ICD-10-CM

## 2017-10-03 LAB — POCT INR: INR: 2 (ref 2.0–3.0)

## 2017-10-03 NOTE — Patient Instructions (Signed)
Description   Continue on same dosage 1 tablet everyday. Recheck INR in 4 weeks. Call us with medication updates or new/changes. [phone]     

## 2017-10-16 ENCOUNTER — Other Ambulatory Visit: Payer: Self-pay | Admitting: Family Medicine

## 2017-10-17 NOTE — Telephone Encounter (Signed)
Last OV 08/23/2016   Last refilled  09/10/2016  Disp 60 with 11 refills

## 2017-10-17 NOTE — Telephone Encounter (Signed)
Pt is due for an OV last seen 1 year ago. One refill only.

## 2017-10-25 ENCOUNTER — Other Ambulatory Visit: Payer: Self-pay | Admitting: Internal Medicine

## 2017-10-25 ENCOUNTER — Other Ambulatory Visit: Payer: Self-pay | Admitting: Physician Assistant

## 2017-10-30 ENCOUNTER — Ambulatory Visit: Payer: PPO | Admitting: *Deleted

## 2017-10-30 DIAGNOSIS — I4891 Unspecified atrial fibrillation: Secondary | ICD-10-CM

## 2017-10-30 DIAGNOSIS — Z5181 Encounter for therapeutic drug level monitoring: Secondary | ICD-10-CM | POA: Diagnosis not present

## 2017-10-30 LAB — POCT INR: INR: 1.9 — AB (ref 2.0–3.0)

## 2017-10-30 NOTE — Patient Instructions (Signed)
Description   Today July 3rd take 1 and 1/2 tablets then continue on same dosage 1 tablet everyday. Recheck INR in 2 weeks. Call us with medication updates or new/changes. [phone] [id number]

## 2017-11-04 ENCOUNTER — Encounter: Payer: Self-pay | Admitting: Internal Medicine

## 2017-11-12 ENCOUNTER — Other Ambulatory Visit: Payer: Self-pay | Admitting: Family Medicine

## 2017-11-12 ENCOUNTER — Other Ambulatory Visit: Payer: Self-pay | Admitting: Internal Medicine

## 2017-11-12 NOTE — Telephone Encounter (Signed)
Last refill pt will need an office visit for more refills   

## 2017-11-12 NOTE — Telephone Encounter (Signed)
30 day supply sent with no refills. Pt is due for an office visit for more refills.

## 2017-11-13 ENCOUNTER — Other Ambulatory Visit: Payer: Self-pay | Admitting: Family Medicine

## 2017-11-13 NOTE — Telephone Encounter (Signed)
Last OV 08/23/2016   Both Rx's were last refilled

## 2017-11-14 ENCOUNTER — Telehealth: Payer: Self-pay

## 2017-11-14 DIAGNOSIS — H3530 Unspecified macular degeneration: Secondary | ICD-10-CM | POA: Diagnosis not present

## 2017-11-14 DIAGNOSIS — H401132 Primary open-angle glaucoma, bilateral, moderate stage: Secondary | ICD-10-CM | POA: Diagnosis not present

## 2017-11-14 NOTE — Telephone Encounter (Signed)
Rx was sent in on 11/12/17  Called and spoke with patient, he stated that he requested the refill on 11/12/17 and a prescription denial showed up on MyChart. Patient was informed that [facility] [address] on 11/12/17 for Qty of 60 with 0 refills. Patient stated he would check with his pharmacy nad call back if they did not have it.

## 2017-11-14 NOTE — Telephone Encounter (Signed)
Copied from [place] [phone]. Topic: General - Other >> Nov 14, 2017  1:03 PM Pricilla Handler wrote: Reason for CRM: Patient called upset that his refill request for Carvedilol (COREG) 3.125 MG tablet had been denied by his pharmacy. I advised the patient that he may be due for a visit with Dr. Sarajane Jews since he has not seen him since April 2018. Patient states that he does not have time to wait around for a physical, because he is out of medication and needs it asap. Patient would like for Dr. Barbie Banner assistant to call him today at [phone].      Thank You!!!

## 2017-11-18 ENCOUNTER — Telehealth: Payer: Self-pay | Admitting: *Deleted

## 2017-11-18 ENCOUNTER — Encounter: Payer: Self-pay | Admitting: Internal Medicine

## 2017-11-18 ENCOUNTER — Ambulatory Visit (INDEPENDENT_AMBULATORY_CARE_PROVIDER_SITE_OTHER): Payer: PPO | Admitting: *Deleted

## 2017-11-18 ENCOUNTER — Ambulatory Visit: Payer: PPO | Admitting: Internal Medicine

## 2017-11-18 VITALS — BP 102/60 | HR 79 | Ht 68.0 in | Wt 160.2 lb

## 2017-11-18 DIAGNOSIS — Z95810 Presence of automatic (implantable) cardiac defibrillator: Secondary | ICD-10-CM | POA: Diagnosis not present

## 2017-11-18 DIAGNOSIS — I429 Cardiomyopathy, unspecified: Secondary | ICD-10-CM | POA: Diagnosis not present

## 2017-11-18 DIAGNOSIS — I4891 Unspecified atrial fibrillation: Secondary | ICD-10-CM

## 2017-11-18 DIAGNOSIS — I5022 Chronic systolic (congestive) heart failure: Secondary | ICD-10-CM | POA: Diagnosis not present

## 2017-11-18 DIAGNOSIS — Z5181 Encounter for therapeutic drug level monitoring: Secondary | ICD-10-CM | POA: Diagnosis not present

## 2017-11-18 LAB — CUP PACEART INCLINIC DEVICE CHECK
Battery Remaining Longevity: 57 mo
Date Time Interrogation Session: 20190722133943
HighPow Impedance: 67.5 Ohm
Implantable Lead Implant Date: 20160721
Implantable Lead Implant Date: 20160721
Implantable Lead Location: 753859
Implantable Pulse Generator Implant Date: 20160721
Lead Channel Impedance Value: 425 Ohm
Lead Channel Impedance Value: 775 Ohm
Lead Channel Pacing Threshold Amplitude: 1.75 V
Lead Channel Pacing Threshold Pulse Width: 0.5 ms
Lead Channel Pacing Threshold Pulse Width: 0.5 ms
Lead Channel Setting Pacing Amplitude: 2.5 V
Lead Channel Setting Pacing Amplitude: 2.5 V
Lead Channel Setting Pacing Pulse Width: 0.5 ms
MDC IDC LEAD IMPLANT DT: 20100521
MDC IDC LEAD LOCATION: 753858
MDC IDC LEAD LOCATION: 753860
MDC IDC MSMT LEADCHNL RA SENSING INTR AMPL: 0.6 mV
MDC IDC MSMT LEADCHNL RV IMPEDANCE VALUE: 537.5 Ohm
MDC IDC MSMT LEADCHNL RV PACING THRESHOLD AMPLITUDE: 1 V
MDC IDC MSMT LEADCHNL RV SENSING INTR AMPL: 8.2 mV
MDC IDC SET LEADCHNL RV PACING PULSEWIDTH: 0.5 ms
MDC IDC SET LEADCHNL RV SENSING SENSITIVITY: 0.5 mV
MDC IDC STAT BRADY RA PERCENT PACED: 0 %
Pulse Gen Serial Number: 7289863

## 2017-11-18 LAB — POCT INR: INR: 2.4 (ref 2.0–3.0)

## 2017-11-18 MED ORDER — CARVEDILOL 3.125 MG PO TABS: ORAL_TABLET | ORAL | 3 refills | Status: DC

## 2017-11-18 MED ORDER — DIGOXIN 125 MCG PO TABS: 0.1250 mg | ORAL_TABLET | Freq: Every day | ORAL | 3 refills | Status: DC

## 2017-11-18 NOTE — Patient Instructions (Addendum)
Medication Instructions:  The current medical regimen is effective;  continue present plan and medications.  Follow-Up: Follow up in 1 year with Dr. Lovena Le.  You will receive a letter in the mail 2 months before you are due.  Please call us when you receive this letter to schedule your follow up appointment.  Remote monitoring is used to monitor your Pacemaker of ICD from home. This monitoring reduces the number of office visits required to check your device to one time per year. It allows [place] to keep an eye on the functioning of your device to ensure it is working properly. You are scheduled for a device check from home on 12/02/2017. You may send your transmission at any time that day. If you have a wireless device, the transmission will be sent automatically. After your physician reviews your transmission, you will receive a postcard with your next transmission date.  If you need a refill on your cardiac medications before your next appointment, please call your pharmacy.  Thank you for choosing [facility]!!

## 2017-11-18 NOTE — Patient Instructions (Signed)
Description   Continue on same dosage 1 tablet everyday. Recheck INR in 4 weeks. Call us with medication updates or new/changes. [phone] [id number]

## 2017-11-18 NOTE — Progress Notes (Signed)
HPI Andrew Vance returns today for followup. He is a pleasant 82 yo man with a h/o atrial fib, symptomatic bradycardia and LBBB. He is s/p biV ICD and  paces 50% of the time. He has class 2 CHF symptoms. His EF was 15-20% initially then 25-30% by repeat echo. He has been on a beta blocker and ACE inhibitor and with initiation of lasix his dyspnea has resolved and his peripheral edema improved. He denies chest pain. He is back to work. He does admit to some dietary indiscretion.   Allergies  Allergen Reactions  . Zithromax [Azithromycin] Other (See Comments)    Affects Coumadin levels      Current Outpatient Medications  Medication Sig Dispense Refill  . acetaminophen (TYLENOL) 325 MG tablet Take 650 mg by mouth 2 (two) times daily as needed (pain).    Marland Kitchen amoxicillin (AMOXIL) 500 MG tablet Take 2,000 mg by mouth as directed. Prior to dentist appointment    . brimonidine (ALPHAGAN) 0.2 % ophthalmic solution Place 1 drop into both eyes 2 (two) times daily.    . carvedilol (COREG) 3.125 MG tablet TAKE 1 TABLET BY MOUTH 2 TIMES DAILY WITH A MEAL 180 tablet 3  . digoxin (LANOXIN) 0.125 MG tablet Take 1 tablet (0.125 mg total) by mouth daily. 90 tablet 3  . eplerenone (INSPRA) 25 MG tablet TAKE 1 TABLET BY MOUTH EVERY DAY 90 tablet 0  . furosemide (LASIX) 40 MG tablet TAKE 1 TABLET (40 MG TOTAL) BY MOUTH DAILY. 30 tablet 0  . hydrocortisone cream 1 % Apply 1 application topically daily as needed for itching. Reported on 06/20/2015    . latanoprost (XALATAN) 0.005 % ophthalmic solution Place 1 drop into both eyes at bedtime.    . Lutein 20 MG CAPS Take 20 mg by mouth at bedtime.     . rosuvastatin (CRESTOR) 10 MG tablet TAKE 1 TABLET BY MOUTH EVERY DAY 90 tablet 3  . timolol (BETIMOL) 0.5 % ophthalmic solution 1 drop 2 (two) times daily. As directed    . warfarin (COUMADIN) 5 MG tablet TAKE AS DIRECTED BY COUMADIN CLINIC 100 tablet 1   No current facility-administered medications for this  visit.      Past Medical History:  Diagnosis Date  . AICD (automatic cardioverter/defibrillator) present   . Anemia, iron deficiency    hx  . Arthritis    "all over"  . Atrial fibrillation [facility])    sees Dr. Virl Axe  . Bronchial pneumonia X 3 "when I was a kid"  . Complication of anesthesia 1976   BP elevated with spinal;  "I was still in the service when that happened"  . GERD (gastroesophageal reflux disease)   . GI bleed    hx  . H/O hiatal hernia   . Hyperlipidemia   . Hypertension   . Psoriatic arthritis [facility])    sees Dr. Hurley Cisco   . Tachycardia-bradycardia syndrome ([place])     ROS:   All systems reviewed and negative except as noted in the HPI.   Past Surgical History:  Procedure Laterality Date  . APPENDECTOMY    . CARDIAC CATHETERIZATION  09/10/08  . CARDIAC DEFIBRILLATOR PLACEMENT  11/18/2014  . COLONOSCOPY  05-27-10   per Dr, Ardis Hughs, extensive diverticulosis, repeat in 10 yrs   . EP IMPLANTABLE DEVICE N/A 11/18/2014   Procedure: BiV ICD Upgrade;  Surgeon: Evans Lance, MD;  Location: [place] [facility];  Service: Cardiovascular;  Laterality: N/A;  .  ESOPHAGOGASTRODUODENOSCOPY  05-27-10   per Dr. Ardis Hughs, normal   . HAND SURGERY Left 1978   lft hand, fusions to DIPs and PIPs of fingers 1,2,4,and 5  . INSERT / REPLACE / REMOVE PACEMAKER  2010  . JOINT REPLACEMENT    . PILONIDAL CYST EXCISION  1961  . TOTAL HIP ARTHROPLASTY Bilateral 1998 `2001   bilateral, sees Dr. Para March   . TOTAL HIP REVISION  02/25/2012   Procedure: TOTAL HIP REVISION;  Surgeon: Kerin Salen, MD;  Location: [place];  Service: Orthopedics;  Laterality: Left;     Family History  Problem Relation Age of Onset  . Hypertension Other   . Heart disease Other      Social History   Socioeconomic History  . Marital status: Married    Spouse name: Not on file  . Number of children: Not on file  . Years of education: Not on file  . Highest education level: Not on file    Occupational History  . Not on file  Social Needs  . Financial resource strain: Not on file  . Food insecurity:    Worry: Not on file    Inability: Not on file  . Transportation needs:    Medical: Not on file    Non-medical: Not on file  Tobacco Use  . Smoking status: Former Smoker    Packs/day: 1.00    Years: 30.00    Pack years: 30.00    Types: Cigarettes, Pipe    Last attempt to quit: 02/17/1982    Years since quitting: 35.7  . Smokeless tobacco: Former Systems developer    Quit date: 05/01/1983  Substance and Sexual Activity  . Alcohol use: Yes    Alcohol/week: 1.8 oz    Types: 1 Standard drinks or equivalent, 2 Shots of liquor per week  . Drug use: No  . Sexual activity: Yes  Lifestyle  . Physical activity:    Days per week: Not on file    Minutes per session: Not on file  . Stress: Not on file  Relationships  . Social connections:    Talks on phone: Not on file    Gets together: Not on file    Attends religious service: Not on file    Active member of club or organization: Not on file    Attends meetings of clubs or organizations: Not on file    Relationship status: Not on file  . Intimate partner violence:    Fear of current or ex partner: Not on file    Emotionally abused: Not on file    Physically abused: Not on file    Forced sexual activity: Not on file  Other Topics Concern  . Not on file  Social History Narrative  . Not on file     BP 102/60 (BP Location: Left Arm, Patient Position: Sitting, Cuff Size: Normal)   Pulse 79   Ht 5\' 8"  (1.727 m)   Wt 160 lb 4 oz (72.7 kg)   BMI 24.37 kg/m   Physical Exam:  Well appearing NAD HEENT: Unremarkable Neck:  No JVD, no thyromegally Lymphatics:  No adenopathy Back:  No CVA tenderness Lungs:  Clear with no wheezes HEART:  Regular rate rhythm, no murmurs, no rubs, no clicks Abd:  soft, positive bowel sounds, no organomegally, no rebound, no guarding Ext:  2 plus pulses, no edema, no cyanosis, no clubbing Skin:   No rashes no nodules Neuro:  CN II through XII intact, motor grossly intact  EKG - atrial fib with ventricular pacing  DEVICE  Normal device function.  See PaceArt for details.   Assess/Plan: 1. Chronic systolic heart failure - his symptoms are class 2. He is still working. He will continue his current meds. I asked him to work on reducing his salt intake. 2. ICD - his St. Jude BiV ICD is working normally. Will recheck in several months. 3. Atrial fib - his ventricular rate is well controlled. No change in meds.  [name].D.

## 2017-11-18 NOTE — Telephone Encounter (Signed)
After further review of device check from 7/22 by [facility] rep, Dionne Milo, and Dr. Lovena Le, plan to bring patient back in to turn morphology discriminator off due to baseline LBBB. Plan to reprogram interval stability and onset discriminators on at nominal settings. Patient agreeable to DC appointment on 11/21/17 at 8:30am.

## 2017-11-21 ENCOUNTER — Ambulatory Visit (INDEPENDENT_AMBULATORY_CARE_PROVIDER_SITE_OTHER): Payer: PPO | Admitting: *Deleted

## 2017-11-21 DIAGNOSIS — Z95810 Presence of automatic (implantable) cardiac defibrillator: Secondary | ICD-10-CM

## 2017-11-21 DIAGNOSIS — I429 Cardiomyopathy, unspecified: Secondary | ICD-10-CM

## 2017-11-21 LAB — CUP PACEART INCLINIC DEVICE CHECK
HIGH POWER IMPEDANCE MEASURED VALUE: 71 Ohm
Implantable Lead Implant Date: 20100521
Implantable Lead Implant Date: 20160721
Implantable Lead Location: 753858
Implantable Lead Location: 753859
Implantable Pulse Generator Implant Date: 20160721
Lead Channel Impedance Value: 437.5 Ohm
Lead Channel Impedance Value: 562.5 Ohm
Lead Channel Setting Pacing Amplitude: 2.5 V
Lead Channel Setting Pacing Amplitude: 2.5 V
Lead Channel Setting Pacing Pulse Width: 0.5 ms
MDC IDC LEAD IMPLANT DT: 20160721
MDC IDC LEAD LOCATION: 753860
MDC IDC MSMT BATTERY REMAINING LONGEVITY: 56 mo
MDC IDC MSMT LEADCHNL LV IMPEDANCE VALUE: 750 Ohm
MDC IDC MSMT LEADCHNL RV SENSING INTR AMPL: 12 mV
MDC IDC SESS DTM: 20190725085123
MDC IDC SET LEADCHNL LV PACING PULSEWIDTH: 0.5 ms
MDC IDC SET LEADCHNL RV SENSING SENSITIVITY: 0.5 mV
MDC IDC STAT BRADY RA PERCENT PACED: 0 %
Pulse Gen Serial Number: 7289863

## 2017-11-21 NOTE — Progress Notes (Signed)
CRT-D check in clinic for discriminator reprogramming per GT. No testing performed this session. Morphology turned off, interval stability and sudden onset turned on at nominal settings, tachy diagnosis criteria set to "if all". Merlin on 12/02/17 and ROV with GT in 10/2018.

## 2017-11-25 DIAGNOSIS — Z961 Presence of intraocular lens: Secondary | ICD-10-CM | POA: Diagnosis not present

## 2017-12-02 ENCOUNTER — Ambulatory Visit (INDEPENDENT_AMBULATORY_CARE_PROVIDER_SITE_OTHER): Payer: PPO | Admitting: *Deleted

## 2017-12-02 DIAGNOSIS — I429 Cardiomyopathy, unspecified: Secondary | ICD-10-CM

## 2017-12-02 DIAGNOSIS — I4891 Unspecified atrial fibrillation: Secondary | ICD-10-CM

## 2017-12-03 NOTE — Progress Notes (Signed)
Remote ICD transmission.   

## 2017-12-10 ENCOUNTER — Other Ambulatory Visit: Payer: Self-pay | Admitting: Family Medicine

## 2017-12-16 ENCOUNTER — Ambulatory Visit: Payer: PPO | Admitting: *Deleted

## 2017-12-16 DIAGNOSIS — I4891 Unspecified atrial fibrillation: Secondary | ICD-10-CM

## 2017-12-16 DIAGNOSIS — Z5181 Encounter for therapeutic drug level monitoring: Secondary | ICD-10-CM | POA: Diagnosis not present

## 2017-12-16 LAB — POCT INR: INR: 2.5 (ref 2.0–3.0)

## 2017-12-16 NOTE — Patient Instructions (Signed)
Description   Continue on same dosage 1 tablet everyday. Recheck INR in 5 weeks. Call us with medication updates or new/changes. [phone] [id number]

## 2017-12-31 LAB — CUP PACEART REMOTE DEVICE CHECK
Battery Voltage: 2.96 V
HighPow Impedance: 70 Ohm
HighPow Impedance: 70 Ohm
Implantable Lead Implant Date: 20100521
Implantable Lead Implant Date: 20160721
Implantable Lead Location: 753858
Implantable Lead Location: 753859
Implantable Lead Model: 7122
Implantable Pulse Generator Implant Date: 20160721
Lead Channel Pacing Threshold Amplitude: 1 V
Lead Channel Pacing Threshold Amplitude: 1.75 V
Lead Channel Pacing Threshold Pulse Width: 0.5 ms
Lead Channel Pacing Threshold Pulse Width: 0.5 ms
Lead Channel Sensing Intrinsic Amplitude: 8.5 mV
Lead Channel Setting Pacing Amplitude: 2.5 V
Lead Channel Setting Pacing Amplitude: 2.5 V
Lead Channel Setting Sensing Sensitivity: 0.5 mV
MDC IDC LEAD IMPLANT DT: 20160721
MDC IDC LEAD LOCATION: 753860
MDC IDC MSMT BATTERY REMAINING LONGEVITY: 56 mo
MDC IDC MSMT BATTERY REMAINING PERCENTAGE: 62 %
MDC IDC MSMT LEADCHNL LV IMPEDANCE VALUE: 780 Ohm
MDC IDC MSMT LEADCHNL RV IMPEDANCE VALUE: 530 Ohm
MDC IDC SESS DTM: 20190805211959
MDC IDC SET LEADCHNL LV PACING PULSEWIDTH: 0.5 ms
MDC IDC SET LEADCHNL RV PACING PULSEWIDTH: 0.5 ms
Pulse Gen Serial Number: 7289863

## 2018-01-03 ENCOUNTER — Other Ambulatory Visit: Payer: Self-pay | Admitting: Family Medicine

## 2018-01-14 DIAGNOSIS — H401123 Primary open-angle glaucoma, left eye, severe stage: Secondary | ICD-10-CM | POA: Diagnosis not present

## 2018-01-14 DIAGNOSIS — H401112 Primary open-angle glaucoma, right eye, moderate stage: Secondary | ICD-10-CM | POA: Diagnosis not present

## 2018-01-20 ENCOUNTER — Ambulatory Visit: Payer: PPO

## 2018-01-20 ENCOUNTER — Other Ambulatory Visit: Payer: Self-pay | Admitting: Physician Assistant

## 2018-01-20 DIAGNOSIS — Z5181 Encounter for therapeutic drug level monitoring: Secondary | ICD-10-CM | POA: Diagnosis not present

## 2018-01-20 DIAGNOSIS — I4891 Unspecified atrial fibrillation: Secondary | ICD-10-CM

## 2018-01-20 LAB — POCT INR: INR: 2.5 (ref 2.0–3.0)

## 2018-01-20 NOTE — Patient Instructions (Signed)
Description   Continue on same dosage 1 tablet everyday. Recheck INR in 6 weeks. Call us with medication updates or new/changes. [phone]     

## 2018-01-26 ENCOUNTER — Other Ambulatory Visit: Payer: Self-pay | Admitting: Family Medicine

## 2018-01-27 ENCOUNTER — Encounter: Payer: Self-pay | Admitting: Family Medicine

## 2018-02-13 DIAGNOSIS — H2513 Age-related nuclear cataract, bilateral: Secondary | ICD-10-CM | POA: Diagnosis not present

## 2018-02-13 DIAGNOSIS — H401132 Primary open-angle glaucoma, bilateral, moderate stage: Secondary | ICD-10-CM | POA: Diagnosis not present

## 2018-02-13 DIAGNOSIS — H353131 Nonexudative age-related macular degeneration, bilateral, early dry stage: Secondary | ICD-10-CM | POA: Diagnosis not present

## 2018-03-03 ENCOUNTER — Ambulatory Visit (INDEPENDENT_AMBULATORY_CARE_PROVIDER_SITE_OTHER): Payer: PPO | Admitting: *Deleted

## 2018-03-03 ENCOUNTER — Ambulatory Visit: Payer: PPO | Admitting: *Deleted

## 2018-03-03 ENCOUNTER — Telehealth: Payer: Self-pay

## 2018-03-03 DIAGNOSIS — I429 Cardiomyopathy, unspecified: Secondary | ICD-10-CM

## 2018-03-03 DIAGNOSIS — I4891 Unspecified atrial fibrillation: Secondary | ICD-10-CM

## 2018-03-03 DIAGNOSIS — Z5181 Encounter for therapeutic drug level monitoring: Secondary | ICD-10-CM | POA: Diagnosis not present

## 2018-03-03 LAB — POCT INR: INR: 2.8 (ref 2.0–3.0)

## 2018-03-03 NOTE — Patient Instructions (Signed)
Description   Continue on same dosage 1 tablet everyday. Recheck INR in 6 weeks. Call us with medication updates or new/changes. [phone]     

## 2018-03-03 NOTE — Telephone Encounter (Signed)
Confirmed remote transmission w/ pt daughter.   

## 2018-03-05 NOTE — Progress Notes (Signed)
Remote pacemaker transmission.   

## 2018-03-06 ENCOUNTER — Encounter: Payer: Self-pay | Admitting: Cardiology

## 2018-03-25 DIAGNOSIS — H401112 Primary open-angle glaucoma, right eye, moderate stage: Secondary | ICD-10-CM | POA: Diagnosis not present

## 2018-03-31 ENCOUNTER — Other Ambulatory Visit: Payer: Self-pay | Admitting: Internal Medicine

## 2018-03-31 DIAGNOSIS — Z79899 Other long term (current) drug therapy: Secondary | ICD-10-CM | POA: Diagnosis not present

## 2018-03-31 DIAGNOSIS — Z87891 Personal history of nicotine dependence: Secondary | ICD-10-CM | POA: Diagnosis not present

## 2018-03-31 DIAGNOSIS — I5022 Chronic systolic (congestive) heart failure: Secondary | ICD-10-CM | POA: Diagnosis not present

## 2018-03-31 DIAGNOSIS — I110 Hypertensive heart disease with heart failure: Secondary | ICD-10-CM | POA: Diagnosis not present

## 2018-03-31 DIAGNOSIS — Z95810 Presence of automatic (implantable) cardiac defibrillator: Secondary | ICD-10-CM | POA: Diagnosis not present

## 2018-03-31 DIAGNOSIS — I10 Essential (primary) hypertension: Secondary | ICD-10-CM | POA: Diagnosis not present

## 2018-03-31 DIAGNOSIS — Z7901 Long term (current) use of anticoagulants: Secondary | ICD-10-CM | POA: Diagnosis not present

## 2018-03-31 DIAGNOSIS — I428 Other cardiomyopathies: Secondary | ICD-10-CM | POA: Diagnosis not present

## 2018-03-31 DIAGNOSIS — H401113 Primary open-angle glaucoma, right eye, severe stage: Secondary | ICD-10-CM | POA: Diagnosis not present

## 2018-04-14 ENCOUNTER — Ambulatory Visit: Payer: PPO | Admitting: *Deleted

## 2018-04-14 DIAGNOSIS — I4891 Unspecified atrial fibrillation: Secondary | ICD-10-CM | POA: Diagnosis not present

## 2018-04-14 DIAGNOSIS — Z5181 Encounter for therapeutic drug level monitoring: Secondary | ICD-10-CM | POA: Diagnosis not present

## 2018-04-14 LAB — POCT INR: INR: 2.5 (ref 2.0–3.0)

## 2018-04-14 NOTE — Patient Instructions (Signed)
Description   Continue on same dosage 1 tablet everyday. Recheck INR in 6 weeks. Call us with medication updates or new/changes. [phone]     

## 2018-04-21 ENCOUNTER — Other Ambulatory Visit: Payer: Self-pay | Admitting: Family Medicine

## 2018-04-28 DIAGNOSIS — H2513 Age-related nuclear cataract, bilateral: Secondary | ICD-10-CM | POA: Diagnosis not present

## 2018-04-28 DIAGNOSIS — H3530 Unspecified macular degeneration: Secondary | ICD-10-CM | POA: Diagnosis not present

## 2018-04-28 DIAGNOSIS — H401132 Primary open-angle glaucoma, bilateral, moderate stage: Secondary | ICD-10-CM | POA: Diagnosis not present

## 2018-04-30 HISTORY — PX: GLAUCOMA SURGERY: SHX656

## 2018-04-30 HISTORY — PX: CATARACT EXTRACTION, BILATERAL: SHX1313

## 2018-05-04 LAB — CUP PACEART REMOTE DEVICE CHECK
Battery Remaining Longevity: 54 mo
Battery Remaining Percentage: 60 %
HIGH POWER IMPEDANCE MEASURED VALUE: 61 Ohm
HighPow Impedance: 71 Ohm
Implantable Lead Implant Date: 20160721
Implantable Lead Location: 753860
Implantable Lead Model: 7122
Lead Channel Impedance Value: 450 Ohm
Lead Channel Pacing Threshold Amplitude: 1 V
Lead Channel Pacing Threshold Pulse Width: 0.5 ms
Lead Channel Sensing Intrinsic Amplitude: 7.3 mV
Lead Channel Setting Pacing Amplitude: 2.5 V
Lead Channel Setting Pacing Pulse Width: 0.5 ms
Lead Channel Setting Pacing Pulse Width: 0.5 ms
Lead Channel Setting Sensing Sensitivity: 0.5 mV
MDC IDC LEAD IMPLANT DT: 20100521
MDC IDC LEAD IMPLANT DT: 20160721
MDC IDC LEAD LOCATION: 753858
MDC IDC LEAD LOCATION: 753859
MDC IDC MSMT BATTERY VOLTAGE: 2.95 V
MDC IDC MSMT LEADCHNL LV IMPEDANCE VALUE: 750 Ohm
MDC IDC MSMT LEADCHNL LV PACING THRESHOLD AMPLITUDE: 1.75 V
MDC IDC MSMT LEADCHNL RV PACING THRESHOLD PULSEWIDTH: 0.5 ms
MDC IDC PG IMPLANT DT: 20160721
MDC IDC SESS DTM: 20191104224602
MDC IDC SET LEADCHNL LV PACING AMPLITUDE: 2.5 V
Pulse Gen Serial Number: 7289863

## 2018-05-06 ENCOUNTER — Ambulatory Visit (INDEPENDENT_AMBULATORY_CARE_PROVIDER_SITE_OTHER): Payer: PPO | Admitting: Family Medicine

## 2018-05-06 ENCOUNTER — Encounter: Payer: Self-pay | Admitting: Family Medicine

## 2018-05-06 VITALS — BP 140/82 | HR 95 | Temp 98.6°F | Wt 166.0 lb

## 2018-05-06 DIAGNOSIS — J069 Acute upper respiratory infection, unspecified: Secondary | ICD-10-CM

## 2018-05-06 NOTE — Progress Notes (Signed)
   Subjective:    Patient ID: Andrew Vance, male    DOB: Sep 04, 1935, 83 y.o.   MRN: [id number]  HPI Here for 10 days of PND and a dry cough. He does not feel ill. No fever. He has not taken anything for this.    Review of Systems  Constitutional: Negative.   HENT: Positive for congestion and postnasal drip. Negative for sinus pressure, sinus pain and sore throat.   Eyes: Negative.   Respiratory: Positive for cough.        Objective:   Physical Exam Constitutional:      Appearance: Normal appearance.  HENT:     Right Ear: Tympanic membrane and ear canal normal.     Left Ear: Tympanic membrane and ear canal normal.     Nose: Nose normal.     Mouth/Throat:     Pharynx: Oropharynx is clear.  Eyes:     Conjunctiva/sclera: Conjunctivae normal.  Pulmonary:     Effort: Pulmonary effort is normal. No respiratory distress.     Breath sounds: Normal breath sounds. No stridor. No wheezing, rhonchi or rales.  Lymphadenopathy:     Cervical: No cervical adenopathy.  Neurological:     Mental Status: He is alert.           Assessment & Plan:  Viral URI. Use Delsym, Zyrtec, and Mucinex prn.  Alysia Penna, MD

## 2018-05-26 ENCOUNTER — Ambulatory Visit: Payer: PPO | Admitting: *Deleted

## 2018-05-26 DIAGNOSIS — Z5181 Encounter for therapeutic drug level monitoring: Secondary | ICD-10-CM

## 2018-05-26 DIAGNOSIS — I4891 Unspecified atrial fibrillation: Secondary | ICD-10-CM

## 2018-05-26 LAB — POCT INR: INR: 2.8 (ref 2.0–3.0)

## 2018-05-26 NOTE — Patient Instructions (Signed)
Description   Continue on same dosage 1 tablet everyday. Recheck INR in 6 weeks. Call us with medication updates or new/changes. [phone]     

## 2018-06-02 ENCOUNTER — Ambulatory Visit (INDEPENDENT_AMBULATORY_CARE_PROVIDER_SITE_OTHER): Payer: PPO

## 2018-06-02 DIAGNOSIS — I4891 Unspecified atrial fibrillation: Secondary | ICD-10-CM

## 2018-06-02 DIAGNOSIS — I429 Cardiomyopathy, unspecified: Secondary | ICD-10-CM

## 2018-06-04 ENCOUNTER — Ambulatory Visit (INDEPENDENT_AMBULATORY_CARE_PROVIDER_SITE_OTHER): Payer: PPO | Admitting: Family Medicine

## 2018-06-04 ENCOUNTER — Encounter: Payer: Self-pay | Admitting: Family Medicine

## 2018-06-04 VITALS — BP 110/76 | HR 84 | Temp 98.2°F | Ht 68.0 in | Wt 154.5 lb

## 2018-06-04 DIAGNOSIS — Z0000 Encounter for general adult medical examination without abnormal findings: Secondary | ICD-10-CM

## 2018-06-04 LAB — POC URINALSYSI DIPSTICK (AUTOMATED)
Bilirubin, UA: NEGATIVE
Glucose, UA: NEGATIVE
Ketones, UA: NEGATIVE
NITRITE UA: NEGATIVE
PROTEIN UA: NEGATIVE
Spec Grav, UA: 1.015 (ref 1.010–1.025)
Urobilinogen, UA: 0.2 E.U./dL
pH, UA: 6 (ref 5.0–8.0)

## 2018-06-04 LAB — BASIC METABOLIC PANEL
BUN: 16 mg/dL (ref 6–23)
CALCIUM: 9.4 mg/dL (ref 8.4–10.5)
CO2: 25 mEq/L (ref 19–32)
CREATININE: 0.84 mg/dL (ref 0.40–1.50)
Chloride: 104 mEq/L (ref 96–112)
GFR: 87.85 mL/min (ref >60.00)
GLUCOSE: 84 mg/dL (ref 70–99)
Potassium: 4.6 mEq/L (ref 3.5–5.1)
SODIUM: 141 meq/L (ref 135–145)

## 2018-06-04 LAB — LIPID PANEL
Cholesterol: 115 mg/dL (ref 0–200)
HDL: 52.4 mg/dL (ref >39.00)
LDL CALC: 50 mg/dL (ref 0–99)
NONHDL: 62.3
Total CHOL/HDL Ratio: 2
Triglycerides: 62 mg/dL (ref 0.0–149.0)
VLDL: 12.4 mg/dL (ref 0.0–40.0)

## 2018-06-04 LAB — CBC WITH DIFFERENTIAL/PLATELET
Basophils Absolute: 0 10*3/uL (ref 0.0–0.1)
Basophils Relative: 0.6 % (ref 0.0–3.0)
EOS PCT: 2 % (ref 0.0–5.0)
Eosinophils Absolute: 0.1 10*3/uL (ref 0.0–0.7)
HEMATOCRIT: 44.7 % (ref 39.0–52.0)
Hemoglobin: 15 g/dL (ref 13.0–17.0)
Lymphocytes Relative: 20.5 % (ref 12.0–46.0)
Lymphs Abs: 1.2 10*3/uL (ref 0.7–4.0)
MCHC: 33.5 g/dL (ref 30.0–36.0)
MCV: 93.9 fl (ref 78.0–100.0)
Monocytes Absolute: 0.5 10*3/uL (ref 0.1–1.0)
Monocytes Relative: 9.2 % (ref 3.0–12.0)
Neutro Abs: 4 10*3/uL (ref 1.4–7.7)
Neutrophils Relative %: 67.7 % (ref 43.0–77.0)
Platelets: 151 10*3/uL (ref 150.0–400.0)
RBC: 4.76 Mil/uL (ref 4.22–5.81)
RDW: 14.5 % (ref 11.5–15.5)
WBC: 5.9 10*3/uL (ref 4.0–10.5)

## 2018-06-04 LAB — HEPATIC FUNCTION PANEL
ALT: 13 U/L (ref 0–53)
AST: 18 U/L (ref 0–37)
Albumin: 4.1 g/dL (ref 3.5–5.2)
Alkaline Phosphatase: 74 U/L (ref 39–117)
BILIRUBIN DIRECT: 0.2 mg/dL (ref 0.0–0.3)
BILIRUBIN TOTAL: 0.9 mg/dL (ref 0.2–1.2)
Total Protein: 6.9 g/dL (ref 6.0–8.3)

## 2018-06-04 LAB — TSH: TSH: 1.46 u[IU]/mL (ref 0.35–4.50)

## 2018-06-04 MED ORDER — ROSUVASTATIN CALCIUM 10 MG PO TABS: 10.0000 mg | ORAL_TABLET | Freq: Every day | ORAL | 3 refills | Status: DC

## 2018-06-04 NOTE — Progress Notes (Signed)
   Subjective:    Patient ID: Andrew Vance, male    DOB: 1936/04/19, 83 y.o.   MRN: [id number]  HPI Here for a well exam. He feels good except for a dry lingering cough. We saw him last month for a URI and he feels much better now, but the cough lingers. He sees Cardiology regularly. His coumadin levels have been stable. He is still active at his job.    Review of Systems  Constitutional: Negative.   HENT: Negative.   Eyes: Negative.   Respiratory: Positive for cough.   Cardiovascular: Negative.   Gastrointestinal: Negative.   Genitourinary: Negative.   Musculoskeletal: Positive for arthralgias and back pain.  Skin: Negative.   Neurological: Negative.   Psychiatric/Behavioral: Negative.        Objective:   Physical Exam Constitutional:      General: He is not in acute distress.    Appearance: He is well-developed. He is not diaphoretic.  HENT:     Head: Normocephalic and atraumatic.     Right Ear: External ear normal.     Left Ear: External ear normal.     Nose: Nose normal.     Mouth/Throat:     Pharynx: No oropharyngeal exudate.  Eyes:     General: No scleral icterus.       Right eye: No discharge.        Left eye: No discharge.     Conjunctiva/sclera: Conjunctivae normal.     Pupils: Pupils are equal, round, and reactive to light.  Neck:     Musculoskeletal: Neck supple.     Thyroid: No thyromegaly.     Vascular: No JVD.     Trachea: No tracheal deviation.  Cardiovascular:     Rate and Rhythm: Normal rate and regular rhythm.     Heart sounds: Normal heart sounds. No murmur. No friction rub. No gallop.   Pulmonary:     Effort: Pulmonary effort is normal. No respiratory distress.     Breath sounds: Normal breath sounds. No wheezing or rales.  Chest:     Chest wall: No tenderness.  Abdominal:     General: Bowel sounds are normal. There is no distension.     Palpations: Abdomen is soft. There is no mass.     Tenderness: There is no abdominal tenderness. There  is no guarding or rebound.  Genitourinary:    Penis: No tenderness.   Musculoskeletal: Normal range of motion.        General: No tenderness.  Lymphadenopathy:     Cervical: No cervical adenopathy.  Skin:    General: Skin is warm and dry.     Coloration: Skin is not pale.     Findings: No erythema or rash.  Neurological:     Mental Status: He is alert and oriented to person, place, and time.     Cranial Nerves: No cranial nerve deficit.     Motor: No abnormal muscle tone.     Coordination: Coordination normal.     Deep Tendon Reflexes: Reflexes are normal and symmetric. Reflexes normal.  Psychiatric:        Behavior: Behavior normal.        Thought Content: Thought content normal.        Judgment: Judgment normal.           Assessment & Plan:  Well exam. We discussed diet and exercise. The cough should fade away sometime soon. Get fasting labs today.  Alysia Penna, MD

## 2018-06-05 LAB — CUP PACEART REMOTE DEVICE CHECK
Battery Remaining Longevity: 53 mo
Battery Remaining Percentage: 57 %
Battery Voltage: 2.95 V
Date Time Interrogation Session: 20200203201215
HighPow Impedance: 66 Ohm
HighPow Impedance: 66 Ohm
Implantable Lead Implant Date: 20100521
Implantable Lead Implant Date: 20160721
Implantable Lead Location: 753858
Implantable Lead Location: 753859
Implantable Lead Location: 753860
Implantable Lead Model: 7122
Implantable Pulse Generator Implant Date: 20160721
Lead Channel Impedance Value: 750 Ohm
Lead Channel Impedance Value: 810 Ohm
Lead Channel Pacing Threshold Amplitude: 1 V
Lead Channel Pacing Threshold Amplitude: 1.75 V
Lead Channel Pacing Threshold Pulse Width: 0.5 ms
Lead Channel Pacing Threshold Pulse Width: 0.5 ms
Lead Channel Setting Pacing Amplitude: 2.5 V
Lead Channel Setting Pacing Pulse Width: 0.5 ms
Lead Channel Setting Pacing Pulse Width: 0.5 ms
Lead Channel Setting Sensing Sensitivity: 0.5 mV
MDC IDC LEAD IMPLANT DT: 20160721
MDC IDC MSMT LEADCHNL RV SENSING INTR AMPL: 4.9 mV
MDC IDC SET LEADCHNL RV PACING AMPLITUDE: 2.5 V
Pulse Gen Serial Number: 7289863

## 2018-06-06 ENCOUNTER — Encounter: Payer: Self-pay | Admitting: *Deleted

## 2018-06-11 NOTE — Progress Notes (Signed)
Remote ICD transmission.   

## 2018-06-30 DIAGNOSIS — H401132 Primary open-angle glaucoma, bilateral, moderate stage: Secondary | ICD-10-CM | POA: Diagnosis not present

## 2018-06-30 DIAGNOSIS — H2513 Age-related nuclear cataract, bilateral: Secondary | ICD-10-CM | POA: Diagnosis not present

## 2018-07-07 ENCOUNTER — Ambulatory Visit (INDEPENDENT_AMBULATORY_CARE_PROVIDER_SITE_OTHER): Payer: PPO | Admitting: *Deleted

## 2018-07-07 DIAGNOSIS — Z5181 Encounter for therapeutic drug level monitoring: Secondary | ICD-10-CM | POA: Diagnosis not present

## 2018-07-07 DIAGNOSIS — I4891 Unspecified atrial fibrillation: Secondary | ICD-10-CM | POA: Diagnosis not present

## 2018-07-07 LAB — POCT INR: INR: 1.8 — AB (ref 2.0–3.0)

## 2018-07-07 NOTE — Patient Instructions (Signed)
Description   Today take 1.5 tablets then continue on same dosage 1 tablet everyday. Recheck INR in 5 weeks. Call us with medication updates or new/changes. [phone] [id number]

## 2018-07-31 DIAGNOSIS — M9905 Segmental and somatic dysfunction of pelvic region: Secondary | ICD-10-CM | POA: Diagnosis not present

## 2018-07-31 DIAGNOSIS — L570 Actinic keratosis: Secondary | ICD-10-CM | POA: Diagnosis not present

## 2018-07-31 DIAGNOSIS — Z5181 Encounter for therapeutic drug level monitoring: Secondary | ICD-10-CM | POA: Diagnosis not present

## 2018-07-31 DIAGNOSIS — M9904 Segmental and somatic dysfunction of sacral region: Secondary | ICD-10-CM | POA: Diagnosis not present

## 2018-07-31 DIAGNOSIS — Z9189 Other specified personal risk factors, not elsewhere classified: Secondary | ICD-10-CM | POA: Diagnosis not present

## 2018-07-31 DIAGNOSIS — D1801 Hemangioma of skin and subcutaneous tissue: Secondary | ICD-10-CM | POA: Diagnosis not present

## 2018-07-31 DIAGNOSIS — M62830 Muscle spasm of back: Secondary | ICD-10-CM | POA: Diagnosis not present

## 2018-07-31 DIAGNOSIS — L218 Other seborrheic dermatitis: Secondary | ICD-10-CM | POA: Diagnosis not present

## 2018-07-31 DIAGNOSIS — Z85828 Personal history of other malignant neoplasm of skin: Secondary | ICD-10-CM | POA: Diagnosis not present

## 2018-07-31 DIAGNOSIS — M9903 Segmental and somatic dysfunction of lumbar region: Secondary | ICD-10-CM | POA: Diagnosis not present

## 2018-07-31 DIAGNOSIS — D692 Other nonthrombocytopenic purpura: Secondary | ICD-10-CM | POA: Diagnosis not present

## 2018-07-31 DIAGNOSIS — B181 Chronic viral hepatitis B without delta-agent: Secondary | ICD-10-CM | POA: Diagnosis not present

## 2018-07-31 DIAGNOSIS — L821 Other seborrheic keratosis: Secondary | ICD-10-CM | POA: Diagnosis not present

## 2018-08-01 ENCOUNTER — Other Ambulatory Visit: Payer: Self-pay | Admitting: *Deleted

## 2018-08-02 MED ORDER — WARFARIN SODIUM 5 MG PO TABS: ORAL_TABLET | ORAL | 1 refills | Status: DC

## 2018-08-11 ENCOUNTER — Telehealth: Payer: Self-pay

## 2018-08-11 NOTE — Telephone Encounter (Signed)

## 2018-08-12 ENCOUNTER — Other Ambulatory Visit: Payer: Self-pay

## 2018-08-12 ENCOUNTER — Ambulatory Visit (INDEPENDENT_AMBULATORY_CARE_PROVIDER_SITE_OTHER): Payer: PPO | Admitting: Pharmacist

## 2018-08-12 DIAGNOSIS — Z5181 Encounter for therapeutic drug level monitoring: Secondary | ICD-10-CM | POA: Diagnosis not present

## 2018-08-12 DIAGNOSIS — Z0000 Encounter for general adult medical examination without abnormal findings: Secondary | ICD-10-CM | POA: Diagnosis not present

## 2018-08-12 DIAGNOSIS — I4891 Unspecified atrial fibrillation: Secondary | ICD-10-CM

## 2018-08-12 LAB — POCT INR: INR: 2.6 (ref 2.0–3.0)

## 2018-09-08 ENCOUNTER — Ambulatory Visit (INDEPENDENT_AMBULATORY_CARE_PROVIDER_SITE_OTHER): Payer: PPO | Admitting: *Deleted

## 2018-09-08 ENCOUNTER — Other Ambulatory Visit: Payer: Self-pay

## 2018-09-08 DIAGNOSIS — I4891 Unspecified atrial fibrillation: Secondary | ICD-10-CM | POA: Diagnosis not present

## 2018-09-08 DIAGNOSIS — I429 Cardiomyopathy, unspecified: Secondary | ICD-10-CM

## 2018-09-09 LAB — CUP PACEART REMOTE DEVICE CHECK
Date Time Interrogation Session: 20200512103711
Implantable Lead Implant Date: 20100521
Implantable Lead Implant Date: 20160721
Implantable Lead Implant Date: 20160721
Implantable Lead Location: 753858
Implantable Lead Location: 753859
Implantable Lead Location: 753860
Implantable Lead Model: 7122
Implantable Pulse Generator Implant Date: 20160721
Pulse Gen Serial Number: 7289863

## 2018-09-23 NOTE — Progress Notes (Signed)
Remote ICD transmission.   

## 2018-10-14 ENCOUNTER — Telehealth: Payer: Self-pay

## 2018-10-14 NOTE — Telephone Encounter (Signed)

## 2018-10-21 ENCOUNTER — Other Ambulatory Visit: Payer: Self-pay

## 2018-10-21 ENCOUNTER — Ambulatory Visit (INDEPENDENT_AMBULATORY_CARE_PROVIDER_SITE_OTHER): Payer: PPO

## 2018-10-21 DIAGNOSIS — I4891 Unspecified atrial fibrillation: Secondary | ICD-10-CM

## 2018-10-21 DIAGNOSIS — Z5181 Encounter for therapeutic drug level monitoring: Secondary | ICD-10-CM

## 2018-10-21 LAB — POCT INR: INR: 1.9 — AB (ref 2.0–3.0)

## 2018-10-21 NOTE — Patient Instructions (Signed)
Description   Take 1.5 tablets tomorrow, then resume same dosage 1 tablet everyday. Recheck INR in 6 weeks. Call us with medication updates or new/changes. [phone] [id number]

## 2018-10-26 ENCOUNTER — Other Ambulatory Visit: Payer: Self-pay | Admitting: Physician Assistant

## 2018-11-22 ENCOUNTER — Other Ambulatory Visit: Payer: Self-pay | Admitting: Internal Medicine

## 2018-12-02 ENCOUNTER — Ambulatory Visit (INDEPENDENT_AMBULATORY_CARE_PROVIDER_SITE_OTHER): Payer: PPO

## 2018-12-02 ENCOUNTER — Other Ambulatory Visit: Payer: Self-pay

## 2018-12-02 DIAGNOSIS — I4891 Unspecified atrial fibrillation: Secondary | ICD-10-CM

## 2018-12-02 DIAGNOSIS — Z5181 Encounter for therapeutic drug level monitoring: Secondary | ICD-10-CM | POA: Diagnosis not present

## 2018-12-02 LAB — POCT INR: INR: 2.4 (ref 2.0–3.0)

## 2018-12-02 NOTE — Patient Instructions (Signed)
Description   Continue on same dosage 1 tablet everyday. Recheck INR in 6 weeks. Call us with medication updates or new/changes. [phone] [id number]

## 2018-12-04 ENCOUNTER — Other Ambulatory Visit: Payer: Self-pay | Admitting: Internal Medicine

## 2018-12-08 ENCOUNTER — Ambulatory Visit (INDEPENDENT_AMBULATORY_CARE_PROVIDER_SITE_OTHER): Payer: PPO | Admitting: *Deleted

## 2018-12-08 DIAGNOSIS — I429 Cardiomyopathy, unspecified: Secondary | ICD-10-CM | POA: Diagnosis not present

## 2018-12-09 DIAGNOSIS — H401123 Primary open-angle glaucoma, left eye, severe stage: Secondary | ICD-10-CM | POA: Diagnosis not present

## 2018-12-09 DIAGNOSIS — H401112 Primary open-angle glaucoma, right eye, moderate stage: Secondary | ICD-10-CM | POA: Diagnosis not present

## 2018-12-09 DIAGNOSIS — H2511 Age-related nuclear cataract, right eye: Secondary | ICD-10-CM | POA: Diagnosis not present

## 2018-12-09 DIAGNOSIS — Z961 Presence of intraocular lens: Secondary | ICD-10-CM | POA: Diagnosis not present

## 2018-12-09 LAB — CUP PACEART REMOTE DEVICE CHECK
Battery Remaining Longevity: 44 mo
Battery Remaining Percentage: 50 %
Battery Voltage: 2.93 V
Date Time Interrogation Session: 20200811081708
HighPow Impedance: 66 Ohm
HighPow Impedance: 66 Ohm
Implantable Lead Implant Date: 20100521
Implantable Lead Implant Date: 20160721
Implantable Lead Implant Date: 20160721
Implantable Lead Location: 753858
Implantable Lead Location: 753859
Implantable Lead Location: 753860
Implantable Lead Model: 7122
Implantable Pulse Generator Implant Date: 20160721
Lead Channel Impedance Value: 440 Ohm
Lead Channel Impedance Value: 730 Ohm
Lead Channel Pacing Threshold Amplitude: 1 V
Lead Channel Pacing Threshold Amplitude: 1.75 V
Lead Channel Pacing Threshold Pulse Width: 0.5 ms
Lead Channel Pacing Threshold Pulse Width: 0.5 ms
Lead Channel Sensing Intrinsic Amplitude: 7.3 mV
Lead Channel Setting Pacing Amplitude: 2.5 V
Lead Channel Setting Pacing Amplitude: 2.5 V
Lead Channel Setting Pacing Pulse Width: 0.5 ms
Lead Channel Setting Pacing Pulse Width: 0.5 ms
Lead Channel Setting Sensing Sensitivity: 0.5 mV
Pulse Gen Serial Number: 7289863

## 2018-12-16 ENCOUNTER — Encounter: Payer: Self-pay | Admitting: Cardiology

## 2018-12-16 NOTE — Progress Notes (Signed)
Remote ICD transmission.   

## 2018-12-19 DIAGNOSIS — Z20828 Contact with and (suspected) exposure to other viral communicable diseases: Secondary | ICD-10-CM | POA: Diagnosis not present

## 2018-12-19 DIAGNOSIS — H2511 Age-related nuclear cataract, right eye: Secondary | ICD-10-CM | POA: Diagnosis not present

## 2018-12-22 DIAGNOSIS — I10 Essential (primary) hypertension: Secondary | ICD-10-CM | POA: Diagnosis not present

## 2018-12-22 DIAGNOSIS — I482 Chronic atrial fibrillation, unspecified: Secondary | ICD-10-CM | POA: Diagnosis not present

## 2018-12-22 DIAGNOSIS — I428 Other cardiomyopathies: Secondary | ICD-10-CM | POA: Diagnosis not present

## 2018-12-22 DIAGNOSIS — Z7901 Long term (current) use of anticoagulants: Secondary | ICD-10-CM | POA: Diagnosis not present

## 2018-12-22 DIAGNOSIS — H2511 Age-related nuclear cataract, right eye: Secondary | ICD-10-CM | POA: Diagnosis not present

## 2018-12-22 DIAGNOSIS — Z87891 Personal history of nicotine dependence: Secondary | ICD-10-CM | POA: Diagnosis not present

## 2018-12-26 ENCOUNTER — Other Ambulatory Visit: Payer: Self-pay | Admitting: Internal Medicine

## 2019-01-09 ENCOUNTER — Telehealth: Payer: Self-pay | Admitting: Internal Medicine

## 2019-01-09 MED ORDER — DIGOXIN 125 MCG PO TABS: 0.1250 mg | ORAL_TABLET | Freq: Every day | ORAL | 0 refills | Status: DC

## 2019-01-09 NOTE — Telephone Encounter (Signed)
New message    *STAT* If patient is at the pharmacy, call can be transferred to refill team.   1. Which medications need to be refilled? (please list name of each medication and dose if known) digoxin (LANOXIN) 0.125 MG tablet  2. Which pharmacy/location (including street and city if local pharmacy) is medication to [facility] sent to[id number] [address]  3. Do they need a 30 day or 90 day supply?[place]

## 2019-01-09 NOTE — Telephone Encounter (Signed)
Pt's medication was sent to pt's pharmacy as requested. Confirmation received.  °

## 2019-01-14 ENCOUNTER — Encounter: Payer: Self-pay | Admitting: Internal Medicine

## 2019-01-14 ENCOUNTER — Other Ambulatory Visit: Payer: Self-pay

## 2019-01-14 ENCOUNTER — Ambulatory Visit (INDEPENDENT_AMBULATORY_CARE_PROVIDER_SITE_OTHER): Payer: PPO

## 2019-01-14 ENCOUNTER — Ambulatory Visit (INDEPENDENT_AMBULATORY_CARE_PROVIDER_SITE_OTHER): Payer: PPO | Admitting: Internal Medicine

## 2019-01-14 VITALS — BP 120/76 | HR 70 | Ht 68.0 in | Wt 168.0 lb

## 2019-01-14 DIAGNOSIS — I4891 Unspecified atrial fibrillation: Secondary | ICD-10-CM

## 2019-01-14 DIAGNOSIS — I5022 Chronic systolic (congestive) heart failure: Secondary | ICD-10-CM

## 2019-01-14 DIAGNOSIS — Z5181 Encounter for therapeutic drug level monitoring: Secondary | ICD-10-CM

## 2019-01-14 DIAGNOSIS — Z95810 Presence of automatic (implantable) cardiac defibrillator: Secondary | ICD-10-CM | POA: Diagnosis not present

## 2019-01-14 LAB — POCT INR: INR: 2.4 (ref 2.0–3.0)

## 2019-01-14 MED ORDER — CARVEDILOL 6.25 MG PO TABS: 6.2500 mg | ORAL_TABLET | Freq: Two times a day (BID) | ORAL | 3 refills | Status: DC

## 2019-01-14 NOTE — Progress Notes (Signed)
HPI Andrew Vance returns today for followup. He is a pleasant 83yo man with a h/o atrial fib, symptomatic bradycardia and LBBB. He is s/p biV ICD andpaces 60% of the time. He has class 2 CHF symptoms. His EF was 15-20% initially then 25-30% by repeat echo. He has been on a beta blocker and ACE inhibitor and with initiation of lasix his dyspnea has resolvedand his peripheral edema improved.  He denies chest pain. He is back to work. He does admit to some dietary indiscretion. He has had some palpitations. No syncope. No ICD shocks. He c/o gynecomastia on Inspra Allergies  Allergen Reactions  . Zithromax [Azithromycin] Other (See Comments)    Affects Coumadin levels      Current Outpatient Medications  Medication Sig Dispense Refill  . acetaminophen (TYLENOL) 325 MG tablet Take 650 mg by mouth 2 (two) times daily as needed (pain).    Marland Kitchen amoxicillin (AMOXIL) 500 MG tablet Take 2,000 mg by mouth as directed. Prior to dentist appointment    . digoxin (LANOXIN) 0.125 MG tablet Take 1 tablet (0.125 mg total) by mouth daily. Please keep upcoming appt in September with Dr. Lovena Le before anymore refills. Thank you 90 tablet 0  . hydrocortisone cream 1 % Apply 1 application topically daily as needed for itching. Reported on 06/20/2015    . Lutein 20 MG CAPS Take 20 mg by mouth at bedtime.     . rosuvastatin (CRESTOR) 10 MG tablet Take 1 tablet (10 mg total) by mouth daily. 90 tablet 3  . warfarin (COUMADIN) 5 MG tablet Take As Directed by Coumadin Clinic 100 tablet 1  . carvedilol (COREG) 6.25 MG tablet Take 1 tablet (6.25 mg total) by mouth 2 (two) times daily. 180 tablet 3   No current facility-administered medications for this visit.      Past Medical History:  Diagnosis Date  . AICD (automatic cardioverter/defibrillator) present   . Anemia, iron deficiency    hx  . Arthritis    "all over"  . Atrial fibrillation [facility])    sees Dr. Virl Axe  . Bronchial pneumonia X 3 "when I was a  kid"  . Complication of anesthesia 1976   BP elevated with spinal;  "I was still in the service when that happened"  . GERD (gastroesophageal reflux disease)   . GI bleed    hx  . H/O hiatal hernia   . Hyperlipidemia   . Hypertension   . Psoriatic arthritis [facility])    sees Dr. Hurley Cisco   . Tachycardia-bradycardia syndrome ([place])     ROS:   All systems reviewed and negative except as noted in the HPI.   Past Surgical History:  Procedure Laterality Date  . APPENDECTOMY    . CARDIAC CATHETERIZATION  09/10/08  . CARDIAC DEFIBRILLATOR PLACEMENT  11/18/2014  . COLONOSCOPY  05-27-10   per Dr, Ardis Hughs, extensive diverticulosis, repeat in 10 yrs   . EP IMPLANTABLE DEVICE N/A 11/18/2014   Procedure: BiV ICD Upgrade;  Surgeon: Evans Lance, MD;  Location: [place] [facility];  Service: Cardiovascular;  Laterality: N/A;  . ESOPHAGOGASTRODUODENOSCOPY  05-27-10   per Dr. Ardis Hughs, normal   . HAND SURGERY Left 1978   lft hand, fusions to DIPs and PIPs of fingers 1,2,4,and 5  . INSERT / REPLACE / REMOVE PACEMAKER  2010  . JOINT REPLACEMENT    . PILONIDAL CYST EXCISION  1961  . TOTAL HIP ARTHROPLASTY Bilateral 1998 `2001   bilateral, sees  Dr. Para March   . TOTAL HIP REVISION  02/25/2012   Procedure: TOTAL HIP REVISION;  Surgeon: Kerin Salen, MD;  Location: [place];  Service: Orthopedics;  Laterality: Left;     Family History  Problem Relation Age of Onset  . Hypertension Other   . Heart disease Other      Social History   Socioeconomic History  . Marital status: Married    Spouse name: Not on file  . Number of children: Not on file  . Years of education: Not on file  . Highest education level: Not on file  Occupational History  . Not on file  Social Needs  . Financial resource strain: Not on file  . Food insecurity    Worry: Not on file    Inability: Not on file  . Transportation needs    Medical: Not on file    Non-medical: Not on file  Tobacco Use  . Smoking status:  Former Smoker    Packs/day: 1.00    Years: 30.00    Pack years: 30.00    Types: Cigarettes, Pipe    Quit date: 02/17/1982    Years since quitting: 36.9  . Smokeless tobacco: Former Systems developer    Quit date: 05/01/1983  Substance and Sexual Activity  . Alcohol use: Yes    Alcohol/week: 3.0 standard drinks    Types: 1 Standard drinks or equivalent, 2 Shots of liquor per week  . Drug use: No  . Sexual activity: Yes  Lifestyle  . Physical activity    Days per week: Not on file    Minutes per session: Not on file  . Stress: Not on file  Relationships  . Social Herbalist on phone: Not on file    Gets together: Not on file    Attends religious service: Not on file    Active member of club or organization: Not on file    Attends meetings of clubs or organizations: Not on file    Relationship status: Not on file  . Intimate partner violence    Fear of current or ex partner: Not on file    Emotionally abused: Not on file    Physically abused: Not on file    Forced sexual activity: Not on file  Other Topics Concern  . Not on file  Social History Narrative  . Not on file     BP 120/76   Pulse 70   Ht 5\' 8"  (1.727 m)   Wt 168 lb (76.2 kg)   SpO2 99%   BMI 25.54 kg/m   Physical Exam:  Well appearing NAD HEENT: Unremarkable Neck:  No JVD, no thyromegally Lymphatics:  No adenopathy Back:  No CVA tenderness Lungs:  Clear with no wheezes HEART:  Regular rate rhythm, no murmurs, no rubs, no clicks Abd:  soft, positive bowel sounds, no organomegally, no rebound, no guarding Ext:  2 plus pulses, no edema, no cyanosis, no clubbing Skin:  No rashes no nodules Neuro:  CN II through XII intact, motor grossly intact  EKG - atrial fib with biv pacing  DEVICE  Normal device function.  See PaceArt for details.   Assess/Plan: 1. Atrial fib - his rates are fairly well controlled. He will continue and uptitrate the coreg. 2. Chronic systolic heart failure - he will stop Inspra.  See below. 3. Gynecomastia - he has some breast enlargement and I asked him to stop this and he will increase the coreg. 4. ICD -  His st. Jude biv ICD is working normally.   [name].D.

## 2019-01-14 NOTE — Patient Instructions (Signed)
Description   Continue on same dosage 1 tablet everyday. Recheck INR in 6 weeks. Call us with medication updates or new/changes. [phone]     

## 2019-01-14 NOTE — Patient Instructions (Addendum)
Medication Instructions:  Your physician has recommended you make the following change in your medication:   1.  Stop taking Inspra  2.  Increase your carvedilol---6.25 mg tablet by mouth twice a day  Labwork: None ordered.  Testing/Procedures: None ordered.  Follow-Up: Your physician wants you to follow-up in: one year with Dr. Lovena Le.   You will receive a reminder letter in the mail two months in advance. If you don't receive a letter, please call our office to schedule the follow-up appointment.  Remote monitoring is used to monitor your ICD from home. This monitoring reduces the number of office visits required to check your device to one time per year. It allows [place] to keep an eye on the functioning of your device to ensure it is working properly. You are scheduled for a device check from home on 03/09/2019. You may send your transmission at any time that day. If you have a wireless device, the transmission will be sent automatically. After your physician reviews your transmission, you will receive a postcard with your next transmission date.  Any Other Special Instructions Will Be Listed Below (If Applicable).  If you need a refill on your cardiac medications before your next appointment, please call your pharmacy.

## 2019-01-19 ENCOUNTER — Other Ambulatory Visit: Payer: Self-pay | Admitting: Internal Medicine

## 2019-01-27 ENCOUNTER — Other Ambulatory Visit: Payer: Self-pay | Admitting: *Deleted

## 2019-01-27 MED ORDER — WARFARIN SODIUM 5 MG PO TABS: ORAL_TABLET | ORAL | 1 refills | Status: DC

## 2019-02-25 ENCOUNTER — Other Ambulatory Visit: Payer: Self-pay

## 2019-02-25 ENCOUNTER — Ambulatory Visit (INDEPENDENT_AMBULATORY_CARE_PROVIDER_SITE_OTHER): Payer: PPO | Admitting: *Deleted

## 2019-02-25 DIAGNOSIS — Z5181 Encounter for therapeutic drug level monitoring: Secondary | ICD-10-CM | POA: Diagnosis not present

## 2019-02-25 DIAGNOSIS — I4891 Unspecified atrial fibrillation: Secondary | ICD-10-CM

## 2019-02-25 LAB — POCT INR: INR: 3 (ref 2.0–3.0)

## 2019-02-25 NOTE — Patient Instructions (Addendum)
Description   Continue on same dosage 1 tablet everyday. Recheck INR in 7 weeks. Call us with medication updates or new/changes. [phone]    

## 2019-03-09 ENCOUNTER — Ambulatory Visit (INDEPENDENT_AMBULATORY_CARE_PROVIDER_SITE_OTHER): Payer: PPO | Admitting: *Deleted

## 2019-03-09 DIAGNOSIS — I495 Sick sinus syndrome: Secondary | ICD-10-CM

## 2019-03-09 DIAGNOSIS — I5022 Chronic systolic (congestive) heart failure: Secondary | ICD-10-CM | POA: Diagnosis not present

## 2019-03-10 LAB — CUP PACEART REMOTE DEVICE CHECK
Battery Remaining Longevity: 43 mo
Battery Remaining Percentage: 48 %
Battery Voltage: 2.93 V
Date Time Interrogation Session: 20201109214411
HighPow Impedance: 68 Ohm
HighPow Impedance: 68 Ohm
Implantable Lead Implant Date: 20100521
Implantable Lead Implant Date: 20160721
Implantable Lead Implant Date: 20160721
Implantable Lead Location: 753858
Implantable Lead Location: 753859
Implantable Lead Location: 753860
Implantable Lead Model: 7122
Implantable Pulse Generator Implant Date: 20160721
Lead Channel Impedance Value: 450 Ohm
Lead Channel Impedance Value: 760 Ohm
Lead Channel Pacing Threshold Amplitude: 1 V
Lead Channel Pacing Threshold Amplitude: 1.5 V
Lead Channel Pacing Threshold Pulse Width: 0.5 ms
Lead Channel Pacing Threshold Pulse Width: 0.5 ms
Lead Channel Sensing Intrinsic Amplitude: 6.5 mV
Lead Channel Setting Pacing Amplitude: 2.5 V
Lead Channel Setting Pacing Amplitude: 2.5 V
Lead Channel Setting Pacing Pulse Width: 0.5 ms
Lead Channel Setting Pacing Pulse Width: 0.5 ms
Lead Channel Setting Sensing Sensitivity: 0.5 mV
Pulse Gen Serial Number: 7289863

## 2019-03-18 ENCOUNTER — Telehealth: Payer: Self-pay | Admitting: Internal Medicine

## 2019-03-18 NOTE — Telephone Encounter (Signed)
° ° °  Pt c/o BP issue: STAT if pt c/o blurred vision, one-sided weakness or slurred speech  1. What are your last 5 BP readings?  159/99 148/93 150/99 140/80  2. Are you having any other symptoms (ex. Dizziness, headache, blurred vision, passed out)? dizziness  3. What is your BP issue? Elevated BP

## 2019-03-19 MED ORDER — CARVEDILOL 6.25 MG PO TABS: 9.3750 mg | ORAL_TABLET | Freq: Two times a day (BID) | ORAL | 3 refills | Status: DC

## 2019-03-19 NOTE — Telephone Encounter (Signed)
Detailed message left for Pt.  Advised to increase coreg 6.25 mg to 1.5 tablets (9.375 mg) by mouth twice a day.  Advised to continue to monitor blood pressures and advise if they continue to be elevated.  Advised to call office if any further questions.

## 2019-04-02 DIAGNOSIS — Z961 Presence of intraocular lens: Secondary | ICD-10-CM | POA: Diagnosis not present

## 2019-04-02 DIAGNOSIS — H401132 Primary open-angle glaucoma, bilateral, moderate stage: Secondary | ICD-10-CM | POA: Diagnosis not present

## 2019-04-08 NOTE — Progress Notes (Signed)
Remote ICD transmission.   

## 2019-04-14 DIAGNOSIS — H401112 Primary open-angle glaucoma, right eye, moderate stage: Secondary | ICD-10-CM | POA: Diagnosis not present

## 2019-04-14 DIAGNOSIS — H401123 Primary open-angle glaucoma, left eye, severe stage: Secondary | ICD-10-CM | POA: Diagnosis not present

## 2019-04-16 ENCOUNTER — Ambulatory Visit (INDEPENDENT_AMBULATORY_CARE_PROVIDER_SITE_OTHER): Payer: PPO | Admitting: *Deleted

## 2019-04-16 ENCOUNTER — Other Ambulatory Visit: Payer: Self-pay

## 2019-04-16 ENCOUNTER — Other Ambulatory Visit: Payer: Self-pay | Admitting: Internal Medicine

## 2019-04-16 DIAGNOSIS — Z5181 Encounter for therapeutic drug level monitoring: Secondary | ICD-10-CM

## 2019-04-16 LAB — POCT INR: INR: 3.1 — AB (ref 2.0–3.0)

## 2019-04-16 NOTE — Patient Instructions (Addendum)
  Description   Take 1/2 a tablet tomorrow, then continue on same dosage 1 tablet everyday. Recheck INR in 6 weeks. Call us with medication updates or new/changes. [phone] [id number]

## 2019-05-12 LAB — CUP PACEART INCLINIC DEVICE CHECK
Date Time Interrogation Session: 20200916095639
Implantable Lead Implant Date: 20100521
Implantable Lead Implant Date: 20160721
Implantable Lead Implant Date: 20160721
Implantable Lead Location: 753858
Implantable Lead Location: 753859
Implantable Lead Location: 753860
Implantable Lead Model: 7122
Implantable Pulse Generator Implant Date: 20160721
Pulse Gen Serial Number: 7289863

## 2019-05-28 ENCOUNTER — Ambulatory Visit: Payer: PPO

## 2019-05-28 ENCOUNTER — Other Ambulatory Visit: Payer: Self-pay

## 2019-05-28 ENCOUNTER — Ambulatory Visit (INDEPENDENT_AMBULATORY_CARE_PROVIDER_SITE_OTHER): Payer: PPO

## 2019-05-28 DIAGNOSIS — I4891 Unspecified atrial fibrillation: Secondary | ICD-10-CM

## 2019-05-28 DIAGNOSIS — Z5181 Encounter for therapeutic drug level monitoring: Secondary | ICD-10-CM | POA: Diagnosis not present

## 2019-05-28 LAB — POCT INR: INR: 2.8 (ref 2.0–3.0)

## 2019-05-28 NOTE — Patient Instructions (Signed)
Description   Continue on same dosage 1 tablet everyday. Recheck INR in 6 weeks. Call us with medication updates or new/changes. [phone] [id number]

## 2019-06-06 ENCOUNTER — Ambulatory Visit: Payer: PPO | Attending: Internal Medicine

## 2019-06-06 DIAGNOSIS — Z23 Encounter for immunization: Secondary | ICD-10-CM

## 2019-06-06 NOTE — Progress Notes (Signed)
   Covid-19 Vaccination Clinic  Name:  Andrew Vance    MRN: [id number] DOB: 10/09/1935  06/06/2019  [name] was observed post Covid-19 immunization for 15 minutes without incidence. He was provided with Vaccine Information Sheet and instruction to access the V-Safe system.   [name] was instructed to call 911 with any severe reactions post vaccine: Marland Kitchen Difficulty breathing  . Swelling of your face and throat  . A fast heartbeat  . A bad rash all over your body  . Dizziness and weakness    Immunizations Administered    Name Date Dose VIS Date Route   Pfizer COVID-19 Vaccine 06/06/2019  8:19 AM 0.3 mL 04/10/2019 Intramuscular   Manufacturer: [place]   Lot: [id number]   [place]: [id number]

## 2019-06-08 ENCOUNTER — Ambulatory Visit: Payer: PPO

## 2019-06-08 ENCOUNTER — Ambulatory Visit (INDEPENDENT_AMBULATORY_CARE_PROVIDER_SITE_OTHER): Payer: PPO | Admitting: *Deleted

## 2019-06-08 DIAGNOSIS — Z95810 Presence of automatic (implantable) cardiac defibrillator: Secondary | ICD-10-CM | POA: Diagnosis not present

## 2019-06-08 LAB — CUP PACEART REMOTE DEVICE CHECK
Battery Remaining Longevity: 40 mo
Battery Remaining Percentage: 45 %
Battery Voltage: 2.93 V
Date Time Interrogation Session: 20210208162051
HighPow Impedance: 64 Ohm
HighPow Impedance: 64 Ohm
Implantable Lead Implant Date: 20100521
Implantable Lead Implant Date: 20160721
Implantable Lead Implant Date: 20160721
Implantable Lead Location: 753858
Implantable Lead Location: 753859
Implantable Lead Location: 753860
Implantable Lead Model: 7122
Implantable Pulse Generator Implant Date: 20160721
Lead Channel Impedance Value: 430 Ohm
Lead Channel Impedance Value: 740 Ohm
Lead Channel Pacing Threshold Amplitude: 1 V
Lead Channel Pacing Threshold Amplitude: 1.5 V
Lead Channel Pacing Threshold Pulse Width: 0.5 ms
Lead Channel Pacing Threshold Pulse Width: 0.5 ms
Lead Channel Sensing Intrinsic Amplitude: 6.1 mV
Lead Channel Setting Pacing Amplitude: 2.5 V
Lead Channel Setting Pacing Amplitude: 2.5 V
Lead Channel Setting Pacing Pulse Width: 0.5 ms
Lead Channel Setting Pacing Pulse Width: 0.5 ms
Lead Channel Setting Sensing Sensitivity: 0.5 mV
Pulse Gen Serial Number: 7289863

## 2019-06-09 NOTE — Progress Notes (Signed)
ICD Remote  

## 2019-06-16 ENCOUNTER — Telehealth: Payer: Self-pay | Admitting: Family Medicine

## 2019-06-16 NOTE — Chronic Care Management (AMB) (Signed)
Chronic Care Management   Note  06/16/2019 Name: Andrew Vance MRN: [id number] DOB: [birth year]  Andrew Vance is a 84 y.o. year old male who is a primary care patient of Laurey Morale, MD. I reached out to Andrew Vance by phone today in response to a referral sent by Mr. Jaynie Crumble PCP, Laurey Morale, MD.   [name] was given information about Chronic Care Management services today including:  1. CCM service includes personalized support from designated clinical staff supervised by his physician, including individualized plan of care and coordination with other care providers 2. 24/7 contact phone numbers for assistance for urgent and routine care needs. 3. Service will only be billed when office clinical staff spend 20 minutes or more in a month to coordinate care. 4. Only one practitioner may furnish and bill the service in a calendar month. 5. The patient may stop CCM services at any time (effective at the end of the month) by phone call to the office staff. 6. The patient will be responsible for cost sharing (co-pay) of up to 20% of the service fee (after annual deductible is met).  Patient agreed to services and verbal consent obtained.   Follow up plan:   Raynicia Dukes [employer] Scheduler

## 2019-06-22 ENCOUNTER — Telehealth: Payer: PPO

## 2019-06-22 ENCOUNTER — Telehealth: Payer: Self-pay

## 2019-06-22 DIAGNOSIS — I4891 Unspecified atrial fibrillation: Secondary | ICD-10-CM

## 2019-06-22 DIAGNOSIS — I5022 Chronic systolic (congestive) heart failure: Secondary | ICD-10-CM

## 2019-06-22 NOTE — Telephone Encounter (Signed)
I am requesting an ambulatory referral to CCM for Andrew Vance. Referral will need 2 current diagnosis attached to it. Thank you.

## 2019-06-22 NOTE — Telephone Encounter (Signed)
Done

## 2019-06-30 ENCOUNTER — Ambulatory Visit: Payer: PPO

## 2019-06-30 ENCOUNTER — Ambulatory Visit: Payer: PPO | Attending: Internal Medicine

## 2019-06-30 DIAGNOSIS — Z23 Encounter for immunization: Secondary | ICD-10-CM | POA: Insufficient documentation

## 2019-06-30 NOTE — Progress Notes (Signed)
   Covid-19 Vaccination Clinic  Name:  Andrew Vance    MRN: [id number] DOB: 1936/02/24  06/30/2019  Andrew Vance was observed post Covid-19 immunization for 15 minutes without incident. He was provided with Vaccine Information Sheet and instruction to access the V-Safe system.   Andrew Vance was instructed to call 911 with any severe reactions post vaccine: Marland Kitchen Difficulty breathing  . Swelling of face and throat  . A fast heartbeat  . A bad rash all over body  . Dizziness and weakness   Immunizations Administered    Name Date Dose VIS Date Route   Pfizer COVID-19 Vaccine 06/30/2019  1:58 PM 0.3 mL 04/10/2019 Intramuscular   Manufacturer: [place]   Lot: [id number]   [place]: [id number]

## 2019-07-09 ENCOUNTER — Ambulatory Visit (INDEPENDENT_AMBULATORY_CARE_PROVIDER_SITE_OTHER): Payer: PPO

## 2019-07-09 ENCOUNTER — Other Ambulatory Visit: Payer: Self-pay

## 2019-07-09 DIAGNOSIS — Z5181 Encounter for therapeutic drug level monitoring: Secondary | ICD-10-CM | POA: Diagnosis not present

## 2019-07-09 DIAGNOSIS — I4891 Unspecified atrial fibrillation: Secondary | ICD-10-CM | POA: Diagnosis not present

## 2019-07-09 LAB — POCT INR: INR: 2.4 (ref 2.0–3.0)

## 2019-07-09 NOTE — Patient Instructions (Signed)
Description   Continue on same dosage 1 tablet everyday. Recheck INR in 6 weeks. Call us with medication updates or new/changes. [phone] [id number]

## 2019-07-22 ENCOUNTER — Other Ambulatory Visit: Payer: Self-pay | Admitting: Pharmacist

## 2019-07-22 MED ORDER — WARFARIN SODIUM 5 MG PO TABS: ORAL_TABLET | ORAL | 1 refills | Status: DC

## 2019-07-27 DIAGNOSIS — H401132 Primary open-angle glaucoma, bilateral, moderate stage: Secondary | ICD-10-CM | POA: Diagnosis not present

## 2019-08-05 DIAGNOSIS — D1801 Hemangioma of skin and subcutaneous tissue: Secondary | ICD-10-CM | POA: Diagnosis not present

## 2019-08-05 DIAGNOSIS — Z85828 Personal history of other malignant neoplasm of skin: Secondary | ICD-10-CM | POA: Diagnosis not present

## 2019-08-05 DIAGNOSIS — L812 Freckles: Secondary | ICD-10-CM | POA: Diagnosis not present

## 2019-08-05 DIAGNOSIS — L570 Actinic keratosis: Secondary | ICD-10-CM | POA: Diagnosis not present

## 2019-08-05 DIAGNOSIS — L821 Other seborrheic keratosis: Secondary | ICD-10-CM | POA: Diagnosis not present

## 2019-08-20 ENCOUNTER — Other Ambulatory Visit: Payer: Self-pay

## 2019-08-20 ENCOUNTER — Ambulatory Visit (INDEPENDENT_AMBULATORY_CARE_PROVIDER_SITE_OTHER): Payer: PPO | Admitting: *Deleted

## 2019-08-20 DIAGNOSIS — Z5181 Encounter for therapeutic drug level monitoring: Secondary | ICD-10-CM

## 2019-08-20 LAB — POCT INR: INR: 2.8 (ref 2.0–3.0)

## 2019-08-20 NOTE — Patient Instructions (Signed)
Description   Continue on same dosage 1 tablet everyday. Recheck INR in 7 weeks. Call us with medication updates or new/changes. [phone] [id number]

## 2019-08-21 ENCOUNTER — Other Ambulatory Visit: Payer: Self-pay | Admitting: Family Medicine

## 2019-08-25 ENCOUNTER — Encounter: Payer: Self-pay | Admitting: Family Medicine

## 2019-08-25 ENCOUNTER — Ambulatory Visit (INDEPENDENT_AMBULATORY_CARE_PROVIDER_SITE_OTHER): Payer: PPO | Admitting: Family Medicine

## 2019-08-25 ENCOUNTER — Other Ambulatory Visit: Payer: Self-pay

## 2019-08-25 VITALS — BP 120/70 | HR 72 | Temp 97.6°F | Wt 179.6 lb

## 2019-08-25 DIAGNOSIS — H409 Unspecified glaucoma: Secondary | ICD-10-CM

## 2019-08-25 DIAGNOSIS — Z0000 Encounter for general adult medical examination without abnormal findings: Secondary | ICD-10-CM | POA: Diagnosis not present

## 2019-08-25 MED ORDER — ROSUVASTATIN CALCIUM 10 MG PO TABS: 10.0000 mg | ORAL_TABLET | Freq: Every day | ORAL | 3 refills | Status: DC

## 2019-08-25 NOTE — Progress Notes (Signed)
   Subjective:    Patient ID: Andrew Vance, male    DOB: 10-Aug-1935, 84 y.o.   MRN: [id number]  HPI Here for a well exam. He feels great. He is still working full time. He had successful surgeries for cataracts and glaucoma at [place] last year. He saw Cardiology last month.    Review of Systems  Constitutional: Negative.   HENT: Negative.   Eyes: Negative.   Respiratory: Negative.   Cardiovascular: Negative.   Gastrointestinal: Negative.   Genitourinary: Negative.   Musculoskeletal: Negative.   Skin: Negative.   Neurological: Negative.   Psychiatric/Behavioral: Negative.        Objective:   Physical Exam Constitutional:      General: He is not in acute distress.    Appearance: He is well-developed. He is not diaphoretic.  HENT:     Head: Normocephalic and atraumatic.     Right Ear: External ear normal.     Left Ear: External ear normal.     Nose: Nose normal.     Mouth/Throat:     Pharynx: No oropharyngeal exudate.  Eyes:     General: No scleral icterus.       Right eye: No discharge.        Left eye: No discharge.     Conjunctiva/sclera: Conjunctivae normal.     Pupils: Pupils are equal, round, and reactive to light.  Neck:     Thyroid: No thyromegaly.     Vascular: No JVD.     Trachea: No tracheal deviation.  Cardiovascular:     Rate and Rhythm: Normal rate and regular rhythm.     Heart sounds: Normal heart sounds. No murmur. No friction rub. No gallop.   Pulmonary:     Effort: Pulmonary effort is normal. No respiratory distress.     Breath sounds: Normal breath sounds. No wheezing or rales.  Chest:     Chest wall: No tenderness.  Abdominal:     General: Bowel sounds are normal. There is no distension.     Palpations: Abdomen is soft. There is no mass.     Tenderness: There is no abdominal tenderness. There is no guarding or rebound.  Genitourinary:    Penis: Normal. No tenderness.      Testes: Normal.  Musculoskeletal:        General: No tenderness.  Normal range of motion.     Cervical back: Neck supple.  Lymphadenopathy:     Cervical: No cervical adenopathy.  Skin:    General: Skin is warm and dry.     Coloration: Skin is not pale.     Findings: No erythema or rash.  Neurological:     Mental Status: He is alert and oriented to person, place, and time.     Cranial Nerves: No cranial nerve deficit.     Motor: No abnormal muscle tone.     Coordination: Coordination normal.     Deep Tendon Reflexes: Reflexes are normal and symmetric. Reflexes normal.  Psychiatric:        Behavior: Behavior normal.        Thought Content: Thought content normal.        Judgment: Judgment normal.           Assessment & Plan:  Well exam. We discussed diet and exercise. Set up fasting labs soon. Alysia Penna, MD

## 2019-08-25 NOTE — Telephone Encounter (Signed)
Patient need to schedule an ov for more refills.  Called pt no answer.  

## 2019-08-25 NOTE — Telephone Encounter (Signed)
Called again and a woman answered and stated she will have him call back.

## 2019-08-25 NOTE — Telephone Encounter (Signed)
Pt is scheduled for today 4/27 at 1:30pm

## 2019-08-26 ENCOUNTER — Other Ambulatory Visit (INDEPENDENT_AMBULATORY_CARE_PROVIDER_SITE_OTHER): Payer: PPO

## 2019-08-26 DIAGNOSIS — Z0000 Encounter for general adult medical examination without abnormal findings: Secondary | ICD-10-CM

## 2019-08-26 LAB — HEPATIC FUNCTION PANEL
ALT: 24 U/L (ref 0–53)
AST: 30 U/L (ref 0–37)
Albumin: 4.4 g/dL (ref 3.5–5.2)
Alkaline Phosphatase: 75 U/L (ref 39–117)
Bilirubin, Direct: 0.2 mg/dL (ref 0.0–0.3)
Total Bilirubin: 0.9 mg/dL (ref 0.2–1.2)
Total Protein: 7.1 g/dL (ref 6.0–8.3)

## 2019-08-26 LAB — LIPID PANEL
Cholesterol: 110 mg/dL (ref 0–200)
HDL: 40.8 mg/dL (ref >39.00)
LDL Cholesterol: 51 mg/dL (ref 0–99)
NonHDL: 69.58
Total CHOL/HDL Ratio: 3
Triglycerides: 94 mg/dL (ref 0.0–149.0)
VLDL: 18.8 mg/dL (ref 0.0–40.0)

## 2019-08-26 LAB — CBC WITH DIFFERENTIAL/PLATELET
Basophils Absolute: 0 10*3/uL (ref 0.0–0.1)
Basophils Relative: 0.5 % (ref 0.0–3.0)
Eosinophils Absolute: 0.1 10*3/uL (ref 0.0–0.7)
Eosinophils Relative: 2 % (ref 0.0–5.0)
HCT: 47.3 % (ref 39.0–52.0)
Hemoglobin: 15.9 g/dL (ref 13.0–17.0)
Lymphocytes Relative: 22.8 % (ref 12.0–46.0)
Lymphs Abs: 1.5 10*3/uL (ref 0.7–4.0)
MCHC: 33.7 g/dL (ref 30.0–36.0)
MCV: 99.7 fl (ref 78.0–100.0)
Monocytes Absolute: 0.7 10*3/uL (ref 0.1–1.0)
Monocytes Relative: 10.8 % (ref 3.0–12.0)
Neutro Abs: 4.2 10*3/uL (ref 1.4–7.7)
Neutrophils Relative %: 63.9 % (ref 43.0–77.0)
Platelets: 139 10*3/uL — ABNORMAL LOW (ref 150.0–400.0)
RBC: 4.74 Mil/uL (ref 4.22–5.81)
RDW: 13.9 % (ref 11.5–15.5)
WBC: 6.6 10*3/uL (ref 4.0–10.5)

## 2019-08-26 LAB — BASIC METABOLIC PANEL
BUN: 19 mg/dL (ref 6–23)
CO2: 29 mEq/L (ref 19–32)
Calcium: 9.6 mg/dL (ref 8.4–10.5)
Chloride: 102 mEq/L (ref 96–112)
Creatinine, Ser: 0.93 mg/dL (ref 0.40–1.50)
GFR: 77.87 mL/min (ref >60.00)
Glucose, Bld: 93 mg/dL (ref 70–99)
Potassium: 4.6 mEq/L (ref 3.5–5.1)
Sodium: 138 mEq/L (ref 135–145)

## 2019-08-26 LAB — PSA: PSA: 2.95 ng/mL (ref 0.10–4.00)

## 2019-08-26 LAB — TSH: TSH: 2.2 u[IU]/mL (ref 0.35–4.50)

## 2019-09-07 ENCOUNTER — Ambulatory Visit (INDEPENDENT_AMBULATORY_CARE_PROVIDER_SITE_OTHER): Payer: PPO | Admitting: *Deleted

## 2019-09-07 DIAGNOSIS — I4891 Unspecified atrial fibrillation: Secondary | ICD-10-CM | POA: Diagnosis not present

## 2019-09-07 DIAGNOSIS — I495 Sick sinus syndrome: Secondary | ICD-10-CM

## 2019-09-07 LAB — CUP PACEART REMOTE DEVICE CHECK
Battery Remaining Longevity: 37 mo
Battery Remaining Percentage: 41 %
Battery Voltage: 2.92 V
Date Time Interrogation Session: 20210510132314
HighPow Impedance: 63 Ohm
HighPow Impedance: 63 Ohm
Implantable Lead Implant Date: 20100521
Implantable Lead Implant Date: 20160721
Implantable Lead Implant Date: 20160721
Implantable Lead Location: 753858
Implantable Lead Location: 753859
Implantable Lead Location: 753860
Implantable Lead Model: 7122
Implantable Pulse Generator Implant Date: 20160721
Lead Channel Impedance Value: 540 Ohm
Lead Channel Impedance Value: 680 Ohm
Lead Channel Pacing Threshold Amplitude: 1 V
Lead Channel Pacing Threshold Amplitude: 1.5 V
Lead Channel Pacing Threshold Pulse Width: 0.5 ms
Lead Channel Pacing Threshold Pulse Width: 0.5 ms
Lead Channel Sensing Intrinsic Amplitude: 5.2 mV
Lead Channel Setting Pacing Amplitude: 2.5 V
Lead Channel Setting Pacing Amplitude: 2.5 V
Lead Channel Setting Pacing Pulse Width: 0.5 ms
Lead Channel Setting Pacing Pulse Width: 0.5 ms
Lead Channel Setting Sensing Sensitivity: 0.5 mV
Pulse Gen Serial Number: 7289863

## 2019-09-08 NOTE — Progress Notes (Signed)
Remote ICD transmission.   

## 2019-10-08 ENCOUNTER — Other Ambulatory Visit: Payer: Self-pay

## 2019-10-08 ENCOUNTER — Ambulatory Visit (INDEPENDENT_AMBULATORY_CARE_PROVIDER_SITE_OTHER): Payer: PPO | Admitting: *Deleted

## 2019-10-08 DIAGNOSIS — Z5181 Encounter for therapeutic drug level monitoring: Secondary | ICD-10-CM

## 2019-10-08 DIAGNOSIS — I4891 Unspecified atrial fibrillation: Secondary | ICD-10-CM | POA: Diagnosis not present

## 2019-10-08 LAB — POCT INR: INR: 2.5 (ref 2.0–3.0)

## 2019-10-08 NOTE — Patient Instructions (Signed)
Description   Continue on same dosage of Warfarin 1 tablet everyday. Recheck INR in 8 weeks. Call us with medication updates or new/changes. [phone] [phone]

## 2019-10-10 ENCOUNTER — Other Ambulatory Visit: Payer: Self-pay | Admitting: Internal Medicine

## 2019-11-26 DIAGNOSIS — Z961 Presence of intraocular lens: Secondary | ICD-10-CM | POA: Diagnosis not present

## 2019-11-26 DIAGNOSIS — H401132 Primary open-angle glaucoma, bilateral, moderate stage: Secondary | ICD-10-CM | POA: Diagnosis not present

## 2019-11-26 DIAGNOSIS — H4720 Unspecified optic atrophy: Secondary | ICD-10-CM | POA: Diagnosis not present

## 2019-12-02 ENCOUNTER — Other Ambulatory Visit: Payer: Self-pay

## 2019-12-02 ENCOUNTER — Ambulatory Visit (INDEPENDENT_AMBULATORY_CARE_PROVIDER_SITE_OTHER): Payer: PPO

## 2019-12-02 DIAGNOSIS — I4891 Unspecified atrial fibrillation: Secondary | ICD-10-CM

## 2019-12-02 DIAGNOSIS — Z5181 Encounter for therapeutic drug level monitoring: Secondary | ICD-10-CM

## 2019-12-02 LAB — POCT INR: INR: 2.1 (ref 2.0–3.0)

## 2019-12-02 NOTE — Patient Instructions (Signed)
Description   Continue on same dosage of Warfarin 1 tablet everyday. Recheck INR in 8 weeks. Call us with medication updates or new/changes. [phone] [phone]

## 2019-12-07 ENCOUNTER — Ambulatory Visit (INDEPENDENT_AMBULATORY_CARE_PROVIDER_SITE_OTHER): Payer: PPO | Admitting: *Deleted

## 2019-12-07 DIAGNOSIS — I495 Sick sinus syndrome: Secondary | ICD-10-CM

## 2019-12-09 LAB — CUP PACEART REMOTE DEVICE CHECK
Battery Remaining Longevity: 34 mo
Battery Remaining Percentage: 38 %
Battery Voltage: 2.92 V
Date Time Interrogation Session: 20210809100024
HighPow Impedance: 66 Ohm
HighPow Impedance: 66 Ohm
Implantable Lead Implant Date: 20100521
Implantable Lead Implant Date: 20160721
Implantable Lead Implant Date: 20160721
Implantable Lead Location: 753858
Implantable Lead Location: 753859
Implantable Lead Location: 753860
Implantable Lead Model: 7122
Implantable Pulse Generator Implant Date: 20160721
Lead Channel Impedance Value: 480 Ohm
Lead Channel Impedance Value: 710 Ohm
Lead Channel Pacing Threshold Amplitude: 1 V
Lead Channel Pacing Threshold Amplitude: 1.5 V
Lead Channel Pacing Threshold Pulse Width: 0.5 ms
Lead Channel Pacing Threshold Pulse Width: 0.5 ms
Lead Channel Sensing Intrinsic Amplitude: 5.7 mV
Lead Channel Setting Pacing Amplitude: 2.5 V
Lead Channel Setting Pacing Amplitude: 2.5 V
Lead Channel Setting Pacing Pulse Width: 0.5 ms
Lead Channel Setting Pacing Pulse Width: 0.5 ms
Lead Channel Setting Sensing Sensitivity: 0.5 mV
Pulse Gen Serial Number: 7289863

## 2019-12-10 NOTE — Progress Notes (Signed)
Remote ICD transmission.   

## 2019-12-19 ENCOUNTER — Encounter: Payer: Self-pay | Admitting: Family Medicine

## 2019-12-22 NOTE — Telephone Encounter (Signed)
Noted. He needs to quarantine for 10 days. Follow up as needed.

## 2020-01-04 ENCOUNTER — Encounter: Payer: Self-pay | Admitting: Family Medicine

## 2020-01-05 NOTE — Telephone Encounter (Signed)
Set up an OV so we can figure this out

## 2020-01-06 ENCOUNTER — Ambulatory Visit (INDEPENDENT_AMBULATORY_CARE_PROVIDER_SITE_OTHER): Payer: PPO | Admitting: Family Medicine

## 2020-01-06 ENCOUNTER — Emergency Department (HOSPITAL_COMMUNITY): Payer: PPO

## 2020-01-06 ENCOUNTER — Encounter (HOSPITAL_COMMUNITY): Payer: Self-pay

## 2020-01-06 ENCOUNTER — Inpatient Hospital Stay (HOSPITAL_COMMUNITY)
Admission: EM | Admit: 2020-01-06 | Discharge: 2020-01-12 | DRG: 813 | Disposition: A | Discharge status: Home Health | Payer: PPO | Attending: Internal Medicine | Admitting: Internal Medicine

## 2020-01-06 ENCOUNTER — Other Ambulatory Visit: Payer: Self-pay

## 2020-01-06 ENCOUNTER — Encounter: Payer: Self-pay | Admitting: Family Medicine

## 2020-01-06 VITALS — BP 102/60 | HR 64 | Temp 97.6°F | Wt 169.4 lb

## 2020-01-06 DIAGNOSIS — G92 Toxic encephalopathy: Secondary | ICD-10-CM | POA: Diagnosis not present

## 2020-01-06 DIAGNOSIS — R109 Unspecified abdominal pain: Secondary | ICD-10-CM | POA: Diagnosis not present

## 2020-01-06 DIAGNOSIS — T45515A Adverse effect of anticoagulants, initial encounter: Secondary | ICD-10-CM | POA: Diagnosis present

## 2020-01-06 DIAGNOSIS — R140 Abdominal distension (gaseous): Secondary | ICD-10-CM | POA: Diagnosis not present

## 2020-01-06 DIAGNOSIS — Z95810 Presence of automatic (implantable) cardiac defibrillator: Secondary | ICD-10-CM | POA: Diagnosis not present

## 2020-01-06 DIAGNOSIS — I5022 Chronic systolic (congestive) heart failure: Secondary | ICD-10-CM | POA: Diagnosis present

## 2020-01-06 DIAGNOSIS — E440 Moderate protein-calorie malnutrition: Secondary | ICD-10-CM | POA: Diagnosis not present

## 2020-01-06 DIAGNOSIS — I10 Essential (primary) hypertension: Secondary | ICD-10-CM | POA: Diagnosis not present

## 2020-01-06 DIAGNOSIS — K5900 Constipation, unspecified: Secondary | ICD-10-CM | POA: Diagnosis not present

## 2020-01-06 DIAGNOSIS — L4050 Arthropathic psoriasis, unspecified: Secondary | ICD-10-CM | POA: Diagnosis not present

## 2020-01-06 DIAGNOSIS — I110 Hypertensive heart disease with heart failure: Secondary | ICD-10-CM | POA: Diagnosis present

## 2020-01-06 DIAGNOSIS — M7981 Nontraumatic hematoma of soft tissue: Secondary | ICD-10-CM | POA: Diagnosis not present

## 2020-01-06 DIAGNOSIS — Z79899 Other long term (current) drug therapy: Secondary | ICD-10-CM

## 2020-01-06 DIAGNOSIS — I482 Chronic atrial fibrillation, unspecified: Secondary | ICD-10-CM | POA: Diagnosis present

## 2020-01-06 DIAGNOSIS — S301XXA Contusion of abdominal wall, initial encounter: Secondary | ICD-10-CM

## 2020-01-06 DIAGNOSIS — Z8616 Personal history of COVID-19: Secondary | ICD-10-CM

## 2020-01-06 DIAGNOSIS — K219 Gastro-esophageal reflux disease without esophagitis: Secondary | ICD-10-CM | POA: Diagnosis present

## 2020-01-06 DIAGNOSIS — M159 Polyosteoarthritis, unspecified: Secondary | ICD-10-CM | POA: Diagnosis not present

## 2020-01-06 DIAGNOSIS — Z96643 Presence of artificial hip joint, bilateral: Secondary | ICD-10-CM | POA: Diagnosis present

## 2020-01-06 DIAGNOSIS — E871 Hypo-osmolality and hyponatremia: Secondary | ICD-10-CM | POA: Diagnosis not present

## 2020-01-06 DIAGNOSIS — I495 Sick sinus syndrome: Secondary | ICD-10-CM | POA: Diagnosis present

## 2020-01-06 DIAGNOSIS — Z950 Presence of cardiac pacemaker: Secondary | ICD-10-CM | POA: Diagnosis not present

## 2020-01-06 DIAGNOSIS — D6832 Hemorrhagic disorder due to extrinsic circulating anticoagulants: Principal | ICD-10-CM | POA: Diagnosis present

## 2020-01-06 DIAGNOSIS — R1084 Generalized abdominal pain: Secondary | ICD-10-CM | POA: Diagnosis not present

## 2020-01-06 DIAGNOSIS — I429 Cardiomyopathy, unspecified: Secondary | ICD-10-CM

## 2020-01-06 DIAGNOSIS — K5730 Diverticulosis of large intestine without perforation or abscess without bleeding: Secondary | ICD-10-CM | POA: Diagnosis not present

## 2020-01-06 DIAGNOSIS — T148XXA Other injury of unspecified body region, initial encounter: Secondary | ICD-10-CM

## 2020-01-06 DIAGNOSIS — D62 Acute posthemorrhagic anemia: Secondary | ICD-10-CM | POA: Diagnosis not present

## 2020-01-06 DIAGNOSIS — Z66 Do not resuscitate: Secondary | ICD-10-CM | POA: Diagnosis present

## 2020-01-06 DIAGNOSIS — I4891 Unspecified atrial fibrillation: Secondary | ICD-10-CM | POA: Diagnosis present

## 2020-01-06 DIAGNOSIS — Z7901 Long term (current) use of anticoagulants: Secondary | ICD-10-CM | POA: Diagnosis not present

## 2020-01-06 DIAGNOSIS — S3662XA Contusion of rectum, initial encounter: Secondary | ICD-10-CM | POA: Diagnosis not present

## 2020-01-06 DIAGNOSIS — U071 COVID-19: Secondary | ICD-10-CM | POA: Diagnosis not present

## 2020-01-06 DIAGNOSIS — H409 Unspecified glaucoma: Secondary | ICD-10-CM | POA: Diagnosis present

## 2020-01-06 DIAGNOSIS — Z6825 Body mass index (BMI) 25.0-25.9, adult: Secondary | ICD-10-CM

## 2020-01-06 DIAGNOSIS — E785 Hyperlipidemia, unspecified: Secondary | ICD-10-CM | POA: Diagnosis not present

## 2020-01-06 DIAGNOSIS — R791 Abnormal coagulation profile: Secondary | ICD-10-CM

## 2020-01-06 DIAGNOSIS — T433X5A Adverse effect of phenothiazine antipsychotics and neuroleptics, initial encounter: Secondary | ICD-10-CM | POA: Diagnosis not present

## 2020-01-06 DIAGNOSIS — Z87891 Personal history of nicotine dependence: Secondary | ICD-10-CM

## 2020-01-06 DIAGNOSIS — Z881 Allergy status to other antibiotic agents status: Secondary | ICD-10-CM

## 2020-01-06 LAB — CBC
HCT: 43.1 % (ref 39.0–52.0)
Hemoglobin: 13.9 g/dL (ref 13.0–17.0)
MCH: 32 pg (ref 26.0–34.0)
MCHC: 32.3 g/dL (ref 30.0–36.0)
MCV: 99.1 fL (ref 80.0–100.0)
Platelets: 229 10*3/uL (ref 150–400)
RBC: 4.35 MIL/uL (ref 4.22–5.81)
RDW: 13.9 % (ref 11.5–15.5)
WBC: 15.4 10*3/uL — ABNORMAL HIGH (ref 4.0–10.5)
nRBC: 0 % (ref 0.0–0.2)

## 2020-01-06 LAB — COMPREHENSIVE METABOLIC PANEL
ALT: 16 U/L (ref 0–44)
AST: 22 U/L (ref 15–41)
Albumin: 3.8 g/dL (ref 3.5–5.0)
Alkaline Phosphatase: 64 U/L (ref 38–126)
Anion gap: 13 (ref 5–15)
BUN: 14 mg/dL (ref 8–23)
CO2: 22 mmol/L (ref 22–32)
Calcium: 9.3 mg/dL (ref 8.9–10.3)
Chloride: 102 mmol/L (ref 98–111)
Creatinine, Ser: 1.09 mg/dL (ref 0.61–1.24)
GFR calc Af Amer: >60 mL/min (ref >60)
GFR calc non Af Amer: >60 mL/min (ref >60)
Glucose, Bld: 141 mg/dL — ABNORMAL HIGH (ref 70–99)
Potassium: 4.5 mmol/L (ref 3.5–5.1)
Sodium: 137 mmol/L (ref 135–145)
Total Bilirubin: 1.3 mg/dL — ABNORMAL HIGH (ref 0.3–1.2)
Total Protein: 7.2 g/dL (ref 6.5–8.1)

## 2020-01-06 LAB — URINALYSIS, ROUTINE W REFLEX MICROSCOPIC
Bacteria, UA: NONE SEEN
Bilirubin Urine: NEGATIVE
Glucose, UA: NEGATIVE mg/dL
Hgb urine dipstick: NEGATIVE
Ketones, ur: NEGATIVE mg/dL
Nitrite: NEGATIVE
Protein, ur: NEGATIVE mg/dL
Specific Gravity, Urine: 1.018 (ref 1.005–1.030)
pH: 5 (ref 5.0–8.0)

## 2020-01-06 LAB — PROTIME-INR
INR: 4.7 (ref 0.8–1.2)
Prothrombin Time: 42.9 seconds — ABNORMAL HIGH (ref 11.4–15.2)

## 2020-01-06 LAB — LIPASE, BLOOD: Lipase: 31 U/L (ref 11–51)

## 2020-01-06 LAB — TYPE AND SCREEN
ABO/RH(D): O POS
Antibody Screen: NEGATIVE

## 2020-01-06 LAB — SARS CORONAVIRUS 2 BY RT PCR (HOSPITAL ORDER, PERFORMED IN ~~LOC~~ HOSPITAL LAB): SARS Coronavirus 2: POSITIVE — AB

## 2020-01-06 MED ORDER — ONDANSETRON HCL 4 MG PO TABS: 4.0000 mg | ORAL_TABLET | Freq: Four times a day (QID) | ORAL | Status: DC | PRN

## 2020-01-06 MED ORDER — POLYETHYLENE GLYCOL 3350 17 G PO PACK: 17.0000 g | PACK | Freq: Every day | ORAL | Status: DC | PRN

## 2020-01-06 MED ORDER — ROSUVASTATIN CALCIUM 10 MG PO TABS
10.0000 mg | ORAL_TABLET | Freq: Every evening | ORAL | Status: DC
  Administered 2020-01-06 – 2020-01-11 (×6): 10 mg via ORAL
  Filled 2020-01-06 (×6): qty 1

## 2020-01-06 MED ORDER — TRAZODONE HCL 50 MG PO TABS
50.0000 mg | ORAL_TABLET | Freq: Every evening | ORAL | Status: DC | PRN
  Administered 2020-01-07: 50 mg via ORAL
  Filled 2020-01-06: qty 1

## 2020-01-06 MED ORDER — DIGOXIN 125 MCG PO TABS
125.0000 ug | ORAL_TABLET | Freq: Every evening | ORAL | Status: DC
  Administered 2020-01-06 – 2020-01-11 (×6): 125 ug via ORAL
  Filled 2020-01-06 (×6): qty 1

## 2020-01-06 MED ORDER — ONDANSETRON HCL 4 MG/2ML IJ SOLN
4.0000 mg | Freq: Four times a day (QID) | INTRAMUSCULAR | Status: DC | PRN
  Administered 2020-01-06: 4 mg via INTRAVENOUS
  Filled 2020-01-06: qty 2

## 2020-01-06 MED ORDER — CARVEDILOL 6.25 MG PO TABS
9.3750 mg | ORAL_TABLET | Freq: Two times a day (BID) | ORAL | Status: DC
  Administered 2020-01-06 – 2020-01-08 (×4): 9.375 mg via ORAL
  Filled 2020-01-06: qty 3
  Filled 2020-01-06: qty 1
  Filled 2020-01-06 (×2): qty 3

## 2020-01-06 MED ORDER — SODIUM CHLORIDE 0.9% FLUSH
3.0000 mL | Freq: Two times a day (BID) | INTRAVENOUS | Status: DC
  Administered 2020-01-06 – 2020-01-12 (×12): 3 mL via INTRAVENOUS

## 2020-01-06 MED ORDER — VITAMIN K1 10 MG/ML IJ SOLN
10.0000 mg | Freq: Once | INTRAMUSCULAR | Status: AC
  Administered 2020-01-06: 10 mg via SUBCUTANEOUS
  Filled 2020-01-06: qty 1

## 2020-01-06 MED ORDER — OXYCODONE HCL 5 MG PO TABS
5.0000 mg | ORAL_TABLET | ORAL | Status: DC | PRN
  Administered 2020-01-06 – 2020-01-08 (×3): 5 mg via ORAL
  Filled 2020-01-06 (×3): qty 1

## 2020-01-06 MED ORDER — LORAZEPAM 2 MG/ML IJ SOLN: 1.0000 mg | Freq: Once | INTRAMUSCULAR | Status: DC

## 2020-01-06 MED ORDER — ACETAMINOPHEN 325 MG PO TABS: 650.0000 mg | ORAL_TABLET | Freq: Four times a day (QID) | ORAL | Status: DC | PRN

## 2020-01-06 MED ORDER — METHOCARBAMOL 1000 MG/10ML IJ SOLN
500.0000 mg | Freq: Four times a day (QID) | INTRAVENOUS | Status: DC | PRN
  Filled 2020-01-06: qty 5

## 2020-01-06 MED ORDER — MORPHINE SULFATE (PF) 4 MG/ML IV SOLN
4.0000 mg | Freq: Once | INTRAVENOUS | Status: AC
  Administered 2020-01-06: 4 mg via INTRAVENOUS
  Filled 2020-01-06: qty 1

## 2020-01-06 MED ORDER — LORAZEPAM 2 MG/ML IJ SOLN
0.5000 mg | Freq: Once | INTRAMUSCULAR | Status: AC
  Administered 2020-01-06: 0.5 mg via INTRAVENOUS
  Filled 2020-01-06: qty 1

## 2020-01-06 MED ORDER — IOHEXOL 300 MG/ML  SOLN
100.0000 mL | Freq: Once | INTRAMUSCULAR | Status: AC | PRN
  Administered 2020-01-06: 100 mL via INTRAVENOUS

## 2020-01-06 MED ORDER — ACETAMINOPHEN 650 MG RE SUPP: 650.0000 mg | Freq: Four times a day (QID) | RECTAL | Status: DC | PRN

## 2020-01-06 NOTE — ED Triage Notes (Signed)
Dr. Sarajane Jews has his nurse phone to tell us this pt. Was just seen at his office for "abd. Pain x 1 week, plus some bruising--and he is on Coumadin". He is coming via P.O.V.

## 2020-01-06 NOTE — H&P (Signed)
[facility] History and Physical  KADEEM HYLE [id number] DOB: [birth year] DOA: 01/06/2020  Referring physician: Dr. Zenia Resides PCP: Laurey Morale, MD   Chief Complaint: abdominal pain  HPI: Andrew Vance is a 84 y.o. male with hx of hypertension, A. 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  Patient presented to PCP earlier today with complaint of abdominal pain as well as abdominal bruising.  At that visit abdominal bruises were noted and patient was transported to the ED for further evaluation.  Here patient reports he had a Covid infection around 3 weeks ago. Up until about a week ago he had significant coughing, and reports that his diet also changed significantly with a decreased intake overall as well as specifically leafy greens. Then about 10 days ago he began to have left lower quadrant abdominal pain that got progressively worse. This morning pain spread across all lower quadrant of his abdomen and he started to notice a bulge to the left of his umbilicus which he thought may be a hernia. Due to this he presented to his PCP. He denies any blood in his urine or stool, denies any excessive bleeding from his gums. He has been taking his Coumadin as prescribed without any missed doses and reports that his INR is usually very very stable.  In the ED vital signs were unremarkable.  Lab work-up was notable for unremarkable CMP, CBC with WBC of 15.4 and hemoglobin of 13.9 overall unremarkable, INR 4.7, and unremarkable UA.  He underwent a CT abdomen and pelvis which showed a large left rectus sheath hematoma with evidence of active extravasation.  ED provider discussed case with radiology, they did not recommend any active IR intervention at this time, rather observation and reversal of anticoagulation.  Patient was given 10 mg of vitamin K as well as 2 units of FFP.  He was admitted for further management of  his rectus sheath hematoma.  Review of Systems:  Pertinent positives and negative per HPI, all others reviewed and negative  Past Medical History:  Diagnosis Date  . AICD (automatic cardioverter/defibrillator) present   . Anemia, iron deficiency    hx  . Arthritis    "all over"  . Atrial fibrillation [facility])    sees Dr. Virl Axe  . Bronchial pneumonia X 3 "when I was a kid"  . Cataract   . Complication of anesthesia 1976   BP elevated with spinal;  "I was still in the service when that happened"  . GERD (gastroesophageal reflux disease)   . GI bleed    hx  . Glaucoma   . H/O hiatal hernia   . Hyperlipidemia   . Hypertension   . Psoriatic arthritis [facility])    sees Dr. Hurley Cisco   . Tachycardia-bradycardia syndrome [facility])    Past Surgical History:  Procedure Laterality Date  . APPENDECTOMY    . CARDIAC CATHETERIZATION  09/10/08  . CARDIAC DEFIBRILLATOR PLACEMENT  11/18/2014  . CATARACT EXTRACTION, BILATERAL  2020   at [facility]   . COLONOSCOPY  05-27-10   per Dr, Ardis Hughs, extensive diverticulosis, repeat in 10 yrs   . EP IMPLANTABLE DEVICE N/A 11/18/2014   Procedure: BiV ICD Upgrade;  Surgeon: Evans Lance, MD;  Location: [place] [facility];  Service: Cardiovascular;  Laterality: N/A;  . ESOPHAGOGASTRODUODENOSCOPY  05-27-10   per Dr. Ardis Hughs, normal   . GLAUCOMA SURGERY Bilateral 2020   at [facility]   . HAND  SURGERY Left 1978   lft hand, fusions to DIPs and PIPs of fingers 1,2,4,and 5  . INSERT / REPLACE / REMOVE PACEMAKER  2010  . JOINT REPLACEMENT    . PILONIDAL CYST EXCISION  1961  . TOTAL HIP ARTHROPLASTY Bilateral 1998 `2001   bilateral, sees Dr. Para March   . TOTAL HIP REVISION  02/25/2012   Procedure: TOTAL HIP REVISION;  Surgeon: Kerin Salen, MD;  Location: [place];  Service: Orthopedics;  Laterality: Left;   Social History:  reports that he quit smoking about 37 years ago. His smoking use included cigarettes and pipe. He has a 30.00 pack-year smoking history. He quit  smokeless tobacco use about 36 years ago. He reports current alcohol use of about 3.0 standard drinks of alcohol per week. He reports that he does not use drugs.  Allergies  Allergen Reactions  . Zithromax [Azithromycin] Other (See Comments)    Affects Coumadin levels     Family History  Problem Relation Age of Onset  . Hypertension Other   . Heart disease Other      Prior to Admission medications   Medication Sig Start Date End Date Taking? Authorizing Provider  acetaminophen (TYLENOL) 325 MG tablet Take 650 mg by mouth 2 (two) times daily as needed (pain).   Yes [provider]  amoxicillin (AMOXIL) 500 MG tablet Take 2,000 mg by mouth as directed. Prior to dentist appointment   Yes [provider]  carvedilol (COREG) 6.25 MG tablet Take 1.5 tablets (9.375 mg total) by mouth 2 (two) times daily. 03/19/19  Yes Evans Lance, MD  digoxin (LANOXIN) 0.125 MG tablet TAKE 1 TABLET BY MOUTH DAILY 10/12/19  Yes Evans Lance, MD  fluocinonide (LIDEX) 0.05 % external solution Apply 1 application topically 2 (two) times daily.   Yes [provider]  hydrocortisone cream 1 % Apply 1 application topically daily as needed for itching. Reported on 06/20/2015   Yes [provider]  Lutein 20 MG CAPS Take 20 mg by mouth at bedtime.    Yes [provider]  rosuvastatin (CRESTOR) 10 MG tablet Take 1 tablet (10 mg total) by mouth daily. Patient taking differently: Take 10 mg by mouth every evening.  08/25/19  Yes Laurey Morale, MD  warfarin (COUMADIN) 5 MG tablet Take 1 tablet daily or as directed by Coumadin Clinic Patient taking differently: Take 5 mg by mouth daily. Take 1 tablet daily or as directed by Coumadin Clinic 07/22/19  Yes Deboraha Sprang, MD   Physical Exam: Vitals:   01/06/20 1820 01/06/20 1851 01/06/20 2000 01/06/20 2100  BP: (!) 120/99 106/70 131/81 119/80  Pulse: 71 79 82 82  Resp: 18 17 16 12   Temp:      TempSrc:      SpO2: 98% 97%  96% 98%  Weight:      Height:        Wt Readings from Last 3 Encounters:  01/06/20 74.8 kg  01/06/20 76.8 kg  08/25/19 81.5 kg     . General:  Appears calm and comfortable . Eyes: PERRL, normal lids, irises & conjunctiva . ENT: grossly normal hearing, lips & tongue . Neck: no masses . Cardiovascular: RRR, no m/r/g. No LE edema. JVD not elevated . Respiratory: CTA bilaterally, no w/r/r. Normal respiratory effort. . Abdomen: soft, firm mass ~8cm in diameter palpated to left of umbilicus. Small amount of echymosis around umbilicus and mass. Smaller mass palpated inferior to the above mass. Both masses  tender to palpation. No rebound on exam.  . Skin: abdominal echymoses as detailed above . Musculoskeletal: grossly normal tone BUE/BLE . Psychiatric: grossly normal mood and affect, speech fluent and appropriate . Neurologic: grossly non-focal.          Labs on Admission:  Basic Metabolic Panel: Recent Labs  Lab 01/06/20 1643  NA 137  K 4.5  CL 102  CO2 22  GLUCOSE 141*  BUN 14  CREATININE 1.09  CALCIUM 9.3   Liver Function Tests: Recent Labs  Lab 01/06/20 1643  AST 22  ALT 16  ALKPHOS 64  BILITOT 1.3*  PROT 7.2  ALBUMIN 3.8   Recent Labs  Lab 01/06/20 1643  LIPASE 31   No results for input(s): AMMONIA in the last 168 hours. CBC: Recent Labs  Lab 01/06/20 1643  WBC 15.4*  HGB 13.9  HCT 43.1  MCV 99.1  PLT 229   Cardiac Enzymes: No results for input(s): CKTOTAL, CKMB, CKMBINDEX, TROPONINI in the last 168 hours.  BNP (last 3 results) No results for input(s): BNP in the last 8760 hours.  ProBNP (last 3 results) No results for input(s): PROBNP in the last 8760 hours.  CBG: No results for input(s): GLUCAP in the last 168 hours.  Radiological Exams on Admission: CT Abdomen Pelvis W Contrast  Result Date: 01/06/2020 CLINICAL DATA:  Abdominal distension. EXAM: CT ABDOMEN AND PELVIS WITH CONTRAST TECHNIQUE: Multidetector CT imaging of the abdomen and  pelvis was performed using the standard protocol following bolus administration of intravenous contrast. CONTRAST:  121mL OMNIPAQUE IOHEXOL 300 MG/ML  SOLN COMPARISON:  None. FINDINGS: Lower chest: There is atelectasis at the lung bases, left greater than right.The heart is enlarged. Hepatobiliary: The liver is normal. Normal gallbladder.There is no biliary ductal dilation. Pancreas: Normal contours without ductal dilatation. No peripancreatic fluid collection. Spleen: Unremarkable. Adrenals/Urinary Tract: --Adrenal glands: Unremarkable. --Right kidney/ureter: There is a nonobstructing stone in the interpolar region of the right kidney measuring approximately 4 mm. There is a cyst in the interpolar region with a thin peripheral calcification. --Left kidney/ureter: No hydronephrosis or radiopaque kidney stones. --Urinary bladder: The urinary bladder is poorly evaluated secondary to extensive streak artifact through the patient's pelvis. Stomach/Bowel: --Stomach/Duodenum: There is a small hiatal hernia. --Small bowel: Unremarkable. --Colon: There are colonic diverticula, greatest at the level of the sigmoid colon, without CT evidence for diverticulitis. --Appendix: Not visualized. No right lower quadrant inflammation or free fluid. Vascular/Lymphatic: Atherosclerotic calcification is present within the non-aneurysmal abdominal aorta, without hemodynamically significant stenosis. --No retroperitoneal lymphadenopathy. --No mesenteric lymphadenopathy. --No pelvic or inguinal lymphadenopathy. Reproductive: Unremarkable Other: No ascites or free air. There is a large left rectus sheath hematoma measuring approximately 7.1 by 5.9 by 21 cm. This hematoma has ruptured into the preperitoneal space and pelvis. There is evidence for areas of active extravasation within the hematoma. Musculoskeletal. The patient is status post prior bilateral total hip arthroplasty. There is no acute displaced fracture. IMPRESSION: 1. Large left  rectus sheath hematoma with evidence for active extravasation within the hematoma. 2. Colonic diverticulosis without CT evidence for diverticulitis. 3. There is a nonobstructing right-sided nephroliths as detailed above. 4. Cardiomegaly. Aortic Atherosclerosis (ICD10-I70.0). These results were called by telephone at the time of interpretation on 01/06/2020 at 8:22 pm to provider Lacretia Leigh , who verbally acknowledged these results. Electronically Signed   By: [name] M.D.   On: 01/06/2020 20:26    EKG: Not obtained  Assessment/Plan Active Problems:   Rectus sheath hematoma   #  Rectus sheath hematoma Patient presenting with left rectus hematoma. INR was found to be supratherapeutic at 4.7, has already received 10 mg of IV vitamin K as well as 2 units of FFP. Suspect his supratherapeutic INR is secondary to dietary changes in the setting of his recent Covid illness and that this combined with his coughing fits is the likely etiology of his hematoma. -Monitor on telemetry -Trend CBC every 6 hours -Trend INR daily, hold Coumadin -Maintain diet n.p.o. -Formal consult of IR in the a.m., sooner if decompensates  #Chronic medical problems A. fib: Hold Coumadin, continue digoxin CHF: No signs of volume overload on exam. Continue carvedilol, digoxin Hyperlipidemia: Continue rosuvastatin  Code Status: DNR, confirmed with patient DVT Prophylaxis: SCDs Family Communication: Wife Belenda Cruise updated at bedside Disposition Plan: Inpatient, telemetry   Time spent: 50 min  Clarnce Flock MD/MP[facility]

## 2020-01-06 NOTE — ED Triage Notes (Signed)
He c/o having COVID some three weeks ago. He states he has had a very strong, frequent cough with COVID, but he feels"better now". He is here today with a 7-10 day hx of uper and lower abd. Pain. He has an area of firm swelling at left upper abd. Area. He also has areas of ecchymoses at varous places on his abdomen, which he had not noticed until they were pointed out today by his pcp, Dr. Sarajane Jews. He is in no distress.

## 2020-01-06 NOTE — Progress Notes (Signed)
   Subjective:    Patient ID: Andrew Vance, male    DOB: [birth year], 84 y.o.   MRN: [id number]  HPI Here to follow up a recent Covid-19 infection and to check his abdominal pain. He began to have low grade fevers, ST, headache, and a dry cough several weeks ago (even though he has been vaccinated). He tested positive on 12-17-19, on 12-25-19, and again on 12-30-19. At this point all these symptoms have resolved, but now he has a new problem which is abdominal pain that began one week ago. At first the pain was in the LUQ and now it has also spread to the entire lower abdomen. He has noticed that the left side of his abdomen has been swollen, and he has been constipated for several weeks. He has averaged a small stool every other day. He has not seen any blood in the stool. His appetite is greatly diminished, but he has had no nausea or vomiting. He has also been urinating every 2 hours for several days. No recent trauma. He is on Coumadin and his INR's have been stable (the last one on 12-02-19 was 2.1).    Review of Systems  Constitutional: Positive for appetite change. Negative for chills, diaphoresis and fever.  Respiratory: Negative.   Cardiovascular: Negative.   Gastrointestinal: Positive for abdominal distention, abdominal pain and constipation. Negative for anal bleeding, blood in stool, diarrhea, nausea, rectal pain and vomiting.  Genitourinary: Positive for frequency. Negative for hematuria.       Objective:   Physical Exam Constitutional:      Comments: In mild pain   Cardiovascular:     Rate and Rhythm: Normal rate. Rhythm irregular.     Pulses: Normal pulses.     Heart sounds: Normal heart sounds.  Pulmonary:     Effort: Pulmonary effort is normal. No respiratory distress.     Breath sounds: Normal breath sounds. No stridor. No wheezing, rhonchi or rales.  Abdominal:     General: There is distension.     Tenderness: There is abdominal tenderness. There is guarding and rebound.  There is no right CVA tenderness or left CVA tenderness.     Hernia: No hernia is present.     Comments: He is distended over the LUQ and LLQ. He is very tender over the LUQ and the entire lower abdomen with guarding. There is an ecchymotic area across the LUQ and a purpuric area superior to the umbilicus  Neurological:     Mental Status: He is alert.           Assessment & Plan:  He is recovering  from an apparent Covid-19 infection but he now has severe abdominal pain with distention and tenderness on exam. One possible etiology is internal bleeding. His wife drove him today, so we are having her now transport him directly to [facility] ER for further evaluation.  Alysia Penna, MD

## 2020-01-06 NOTE — ED Provider Notes (Signed)
[facility] DEPT Provider Note   CSN: [id number] Arrival date & time: 01/06/20  1542     History Chief Complaint  Patient presents with  . Abdominal Pain  . Bleeding/Bruising    Andrew Vance is a 84 y.o. male.  84 year old male presents with 2 weeks of diffuse abdominal discomfort not worse in his periumbilical and left lower quadrant.  No associated fever, vomiting, emesis.  Pain is characterizes sharp and worse with any movement.  Patient does take Coumadin and saw his doctor today and his physician noted abdominal ecchymosis as well as other ecchymotic areas on the patient's body.  The patient denies any blood in his stool.  Was transported here for further evaluation        Past Medical History:  Diagnosis Date  . AICD (automatic cardioverter/defibrillator) present   . Anemia, iron deficiency    hx  . Arthritis    "all over"  . Atrial fibrillation [facility])    sees Dr. Virl Axe  . Bronchial pneumonia X 3 "when I was a kid"  . Cataract   . Complication of anesthesia 1976   BP elevated with spinal;  "I was still in the service when that happened"  . GERD (gastroesophageal reflux disease)   . GI bleed    hx  . Glaucoma   . H/O hiatal hernia   . Hyperlipidemia   . Hypertension   . Psoriatic arthritis [facility])    sees Dr. Hurley Cisco   . Tachycardia-bradycardia syndrome [facility])     Patient Active Problem List   Diagnosis Date Noted  . Glaucoma 08/25/2019  . Gynecomastia, male 09/03/2016  . Gout 04/29/2015  . ICD (implantable cardioverter-defibrillator) in place 11/18/2014  . Chronic systolic heart failure ([place]) 09/29/2013  . Encounter for therapeutic drug monitoring 06/23/2013  . 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 Lower GI bleed 06/14/2011  . Cardiomyopathy ([place]) 07/28/2010  . Orthostatic hypotension 07/28/2010  . Pacemaker-DDD  [facility] 07/28/2010  . Tachy-brady syndrome ([place]) 07/28/2010  .  Atrial fibrillation ([place]) 06/26/2010  . HYPERTENSION, BENIGN 09/01/2008  . PSORIATIC ARTHRITIS 09/01/2008    Past Surgical History:  Procedure Laterality Date  . APPENDECTOMY    . CARDIAC CATHETERIZATION  09/10/08  . CARDIAC DEFIBRILLATOR PLACEMENT  11/18/2014  . CATARACT EXTRACTION, BILATERAL  2020   at [facility]   . COLONOSCOPY  05-27-10   per Dr, Ardis Hughs, extensive diverticulosis, repeat in 10 yrs   . EP IMPLANTABLE DEVICE N/A 11/18/2014   Procedure: BiV ICD Upgrade;  Surgeon: Evans Lance, MD;  Location: [facility];  Service: Cardiovascular;  Laterality: N/A;  . ESOPHAGOGASTRODUODENOSCOPY  05-27-10   per Dr. Ardis Hughs, normal   . GLAUCOMA SURGERY Bilateral 2020   at [facility]   . HAND SURGERY Left 1978   lft hand, fusions to DIPs and PIPs of fingers 1,2,4,and 5  . INSERT / REPLACE / REMOVE PACEMAKER  2010  . JOINT REPLACEMENT    . PILONIDAL CYST EXCISION  1961  . TOTAL HIP ARTHROPLASTY Bilateral 1998 `2001   bilateral, sees Dr. Para March   . TOTAL HIP REVISION  02/25/2012   Procedure: TOTAL HIP REVISION;  Surgeon: Kerin Salen, MD;  Location: [facility];  Service: Orthopedics;  Laterality: Left;       Family History  Problem Relation Age of Onset  . Hypertension Other   . Heart disease Other     Social History   Tobacco Use  .  Smoking status: Former Smoker    Packs/day: 1.00    Years: 30.00    Pack years: 30.00    Types: Cigarettes, Pipe    Quit date: 02/17/1982    Years since quitting: 37.9  . Smokeless tobacco: Former Systems developer    Quit date: 05/01/1983  Vaping Use  . Vaping Use: Never used  Substance Use Topics  . Alcohol use: Yes    Alcohol/week: 3.0 standard drinks    Types: 2 Shots of liquor, 1 Standard drinks or equivalent per week  . Drug use: No    Home Medications Prior to Admission medications   Medication Sig Start Date End Date Taking? Authorizing Provider  acetaminophen (TYLENOL) 325 MG tablet Take 650 mg by mouth 2 (two) times daily as needed (pain).     [provider]  amoxicillin (AMOXIL) 500 MG tablet Take 2,000 mg by mouth as directed. Prior to dentist appointment    [provider]  carvedilol (COREG) 6.25 MG tablet Take 1.5 tablets (9.375 mg total) by mouth 2 (two) times daily. 03/19/19   Evans Lance, MD  digoxin (LANOXIN) 0.125 MG tablet TAKE 1 TABLET BY MOUTH DAILY 10/12/19   Evans Lance, MD  fluocinonide (LIDEX) 0.05 % external solution Apply 1 application topically 2 (two) times daily.    [provider]  hydrocortisone cream 1 % Apply 1 application topically daily as needed for itching. Reported on 06/20/2015    [provider]  Lutein 20 MG CAPS Take 20 mg by mouth at bedtime.     [provider]  rosuvastatin (CRESTOR) 10 MG tablet Take 1 tablet (10 mg total) by mouth daily. 08/25/19   Laurey Morale, MD  warfarin (COUMADIN) 5 MG tablet Take 1 tablet daily or as directed by [facility] 07/22/19   Deboraha Sprang, MD    Allergies    Zithromax [azithromycin]  Review of Systems   Review of Systems  All other systems reviewed and are negative.   Physical Exam Updated Vital Signs BP (!) 117/49 (BP Location: Right Arm)   Pulse 86   Temp (!) 97.5 F (36.4 C) (Oral)   Resp 18   Ht 1.727 m (5\' 8" )   Wt 74.8 kg   SpO2 98%   BMI 25.09 kg/m   Physical Exam Vitals and nursing note reviewed.  Constitutional:      General: He is not in acute distress.    Appearance: Normal appearance. He is well-developed. He is not toxic-appearing.  HENT:     Head: Normocephalic and atraumatic.  Eyes:     General: Lids are normal.     Conjunctiva/sclera: Conjunctivae normal.     Pupils: Pupils are equal, round, and reactive to light.  Neck:     Thyroid: No thyroid mass.     Trachea: No tracheal deviation.  Cardiovascular:     Rate and Rhythm: Normal rate and regular rhythm.     Heart sounds: Normal heart sounds. No murmur heard.  No gallop.   Pulmonary:     Effort: Pulmonary  effort is normal. No respiratory distress.     Breath sounds: Normal breath sounds. No stridor. No decreased breath sounds, wheezing, rhonchi or rales.  Abdominal:     General: Bowel sounds are normal. There is distension.     Palpations: Abdomen is soft.     Tenderness: There is no abdominal tenderness. There is guarding. There is no rebound.    Musculoskeletal:  General: No tenderness. Normal range of motion.     Cervical back: Normal range of motion and neck supple.  Skin:    General: Skin is warm and dry.     Findings: No abrasion or rash.  Neurological:     Mental Status: He is alert and oriented to person, place, and time.     GCS: GCS eye subscore is 4. GCS verbal subscore is 5. GCS motor subscore is 6.     Cranial Nerves: No cranial nerve deficit.     Sensory: No sensory deficit.  Psychiatric:        Speech: Speech normal.        Behavior: Behavior normal.     ED Results / Procedures / Treatments   Labs (all labs ordered are listed, but only abnormal results are displayed) Labs Reviewed  LIPASE, BLOOD  COMPREHENSIVE METABOLIC PANEL  CBC  URINALYSIS, ROUTINE W REFLEX MICROSCOPIC  PROTIME-INR  TYPE AND SCREEN    EKG None  Radiology No results found.  Procedures Procedures (including critical care time)  Medications Ordered in ED Medications  morphine 4 MG/ML injection 4 mg (has no administration in time range)  LORazepam (ATIVAN) injection 0.5 mg (has no administration in time range)    ED Course  I have reviewed the triage vital signs and the nursing notes.  Pertinent labs & imaging results that were available during my care of the patient were reviewed by me and considered in my medical decision making (see chart for details).    MDM Rules/Calculators/A&P                          Patient's hemoglobin stable as noted.  INR is elevated at 4.7.  Subsequent CT shows a large rectus sheath hematoma with active extravasation.  Patient given 2 units  of FFP as well as vitamin K.  Will admit to the hospitalist service   CRITICAL CARE Performed by: Leota Jacobsen Total critical care time: 50 minutes Critical care time was exclusive of separately billable procedures and treating other patients. Critical care was necessary to treat or prevent imminent or life-threatening deterioration. Critical care was time spent personally by me on the following activities: development of treatment plan with patient and/or surrogate as well as nursing, discussions with consultants, evaluation of patient's response to treatment, examination of patient, obtaining history from patient or surrogate, ordering and performing treatments and interventions, ordering and review of laboratory studies, ordering and review of radiographic studies, pulse oximetry and re-evaluation of patient's condition.  Final Clinical Impression(s) / ED Diagnoses Final diagnoses:  None    Rx / DC Orders ED Discharge Orders    None       Lacretia Leigh, MD 01/06/20 2046

## 2020-01-07 LAB — BASIC METABOLIC PANEL
Anion gap: 10 (ref 5–15)
BUN: 20 mg/dL (ref 8–23)
CO2: 24 mmol/L (ref 22–32)
Calcium: 9 mg/dL (ref 8.9–10.3)
Chloride: 102 mmol/L (ref 98–111)
Creatinine, Ser: 1.32 mg/dL — ABNORMAL HIGH (ref 0.61–1.24)
GFR calc Af Amer: 58 mL/min — ABNORMAL LOW (ref >60)
GFR calc non Af Amer: 50 mL/min — ABNORMAL LOW (ref >60)
Glucose, Bld: 149 mg/dL — ABNORMAL HIGH (ref 70–99)
Potassium: 4.4 mmol/L (ref 3.5–5.1)
Sodium: 136 mmol/L (ref 135–145)

## 2020-01-07 LAB — CBC
HCT: 28.9 % — ABNORMAL LOW (ref 39.0–52.0)
HCT: 33.7 % — ABNORMAL LOW (ref 39.0–52.0)
HCT: 38.2 % — ABNORMAL LOW (ref 39.0–52.0)
Hemoglobin: 10.9 g/dL — ABNORMAL LOW (ref 13.0–17.0)
Hemoglobin: 12.4 g/dL — ABNORMAL LOW (ref 13.0–17.0)
Hemoglobin: 9.5 g/dL — ABNORMAL LOW (ref 13.0–17.0)
MCH: 32.2 pg (ref 26.0–34.0)
MCH: 32.3 pg (ref 26.0–34.0)
MCH: 32.6 pg (ref 26.0–34.0)
MCHC: 32.3 g/dL (ref 30.0–36.0)
MCHC: 32.5 g/dL (ref 30.0–36.0)
MCHC: 32.9 g/dL (ref 30.0–36.0)
MCV: 99.3 fL (ref 80.0–100.0)
MCV: 99.5 fL (ref 80.0–100.0)
MCV: 99.7 fL (ref 80.0–100.0)
Platelets: 157 10*3/uL (ref 150–400)
Platelets: 188 10*3/uL (ref 150–400)
Platelets: 214 10*3/uL (ref 150–400)
RBC: 2.91 MIL/uL — ABNORMAL LOW (ref 4.22–5.81)
RBC: 3.38 MIL/uL — ABNORMAL LOW (ref 4.22–5.81)
RBC: 3.84 MIL/uL — ABNORMAL LOW (ref 4.22–5.81)
RDW: 14.2 % (ref 11.5–15.5)
RDW: 14.2 % (ref 11.5–15.5)
RDW: 14.2 % (ref 11.5–15.5)
WBC: 11.4 10*3/uL — ABNORMAL HIGH (ref 4.0–10.5)
WBC: 17 10*3/uL — ABNORMAL HIGH (ref 4.0–10.5)
WBC: 18.4 10*3/uL — ABNORMAL HIGH (ref 4.0–10.5)
nRBC: 0 % (ref 0.0–0.2)
nRBC: 0 % (ref 0.0–0.2)
nRBC: 0 % (ref 0.0–0.2)

## 2020-01-07 LAB — PROTIME-INR
INR: 2.2 — ABNORMAL HIGH (ref 0.8–1.2)
INR: 1.5 — ABNORMAL HIGH (ref 0.8–1.2)
Prothrombin Time: 23.9 seconds — ABNORMAL HIGH (ref 11.4–15.2)
Prothrombin Time: 17.2 seconds — ABNORMAL HIGH (ref 11.4–15.2)

## 2020-01-07 MED ORDER — BENZONATATE 100 MG PO CAPS
100.0000 mg | ORAL_CAPSULE | Freq: Three times a day (TID) | ORAL | Status: DC
  Administered 2020-01-07 – 2020-01-12 (×12): 100 mg via ORAL
  Filled 2020-01-07 (×14): qty 1

## 2020-01-07 MED ORDER — SODIUM CHLORIDE 0.9% IV SOLUTION: Freq: Once | INTRAVENOUS | Status: AC

## 2020-01-07 MED ORDER — POLYETHYLENE GLYCOL 3350 17 G PO PACK
17.0000 g | PACK | Freq: Every day | ORAL | Status: DC
  Administered 2020-01-07 – 2020-01-11 (×5): 17 g via ORAL
  Filled 2020-01-07 (×6): qty 1

## 2020-01-07 NOTE — ED Notes (Signed)
Spoke with Dr. Dione Plover regarding pt's positive covid result, provider states he is 21 days from a positive result from when he had covid previously and does not need precautions.

## 2020-01-07 NOTE — ED Notes (Signed)
Pt nauseated after getting up to use urinal, given cold washcloth on neck and assisted back into bed. Given nausea meds with relief.

## 2020-01-07 NOTE — ED Notes (Signed)
Pt received food tray. CS 

## 2020-01-07 NOTE — Progress Notes (Signed)
PROGRESS NOTE    Andrew Vance  TLX:[id number] DOB: 1935/11/05 DOA: 01/06/2020 PCP: Laurey Morale, MD    Brief Narrative:  84 year old gentleman with history of hypertension, chronic A. fib on Coumadin, chronic systolic heart failure, tachybradycardia syndrome status post ICD pacemaker in place presented to the ER with abdominal pain.  Patient reported having Covid infection about 3 weeks ago. [facility], reported attached, 21 days today. He is recovering from COVID-19 with ongoing lingering cough, decreased oral intake.  About 10 days ago began having diffuse abdominal pain mostly localized on left lower quadrant.  Went to primary care doctor's office yesterday and found to have a hematoma on clinical exam and sent to ER. In the emergency room vital signs are stable.  INR 4.7.  A CT scan abdomen pelvis showed large left rectus sheath hematoma with evidence of active extravasation.  Was given 10 mg of vitamin K and 2 units of FFP. Admitting provider has documented that case was discussed with radiology and recommended conservative management with reversal of Coumadin.   Assessment & Plan:   Active Problems:   HYPERTENSION, BENIGN   PSORIATIC ARTHRITIS   Atrial fibrillation (H[facility])   Cardiomyopathy ([facility])   Pacemaker-DDD  [facility]   Tachy-brady syndrome ([facility])   Chronic systolic heart failure (H[facility])   Rectus sheath hematoma  Rectus sheath hematoma, symptomatic with pain and swelling due to coagulopathy with use of Coumadin and supratherapeutic INR.  Acute blood loss anemia: -Vitamin K 10 mg and 2 units FFP given.  INR was 4.7-2.2. -We will give him 2 more units of FFP today in order to reverse Coumadin altogether. - His pain is improving, vitals remained stable. - Baseline hemoglobin 15.9-presented with 13.9-10 today.  We will continue to monitor. - currently no ongoing evidence of bleeding with improvement of pain and mobility. -Continue monitoring, serial  abdominal exam.  Recent COVID-19 infection: Symptoms are improving.  Today he is 21 days from initial Covid test.  He is outside the isolation window because of improvement of Covid symptoms. Will suppress cough with scheduled cough suppressants.  Chronic A. fib: Coumadin will be discontinued until clinical stabilization and reevaluation.  Continue digoxin.  Rate controlled.  On Coreg and digoxin. Patient does have ICD pacemaker and is stable.  No mechanical valve.  Chronic systolic heart failure: Stable with no evidence of fluid overload.  Patient can eat.  Do not anticipate any intervention today.  DVT prophylaxis: SCDs Start: 01/06/20 2248   Code Status: DNR Family Communication: None Disposition Plan: Status is: Inpatient  Remains inpatient appropriate because:Inpatient level of care appropriate due to severity of illness   Dispo: The patient is from: Home              Anticipated d/c is to: Home              Anticipated d/c date is: 2 days              Patient currently is not medically stable to d/c.         Consultants:   None  Procedures:   None  Antimicrobials:   None   Subjective: Patient seen and examined.  History in the ER.  He was sitting in a chair.  Patient stated improved pain on his abdominal wall.  Hungry.  Denies any dizziness or lightheadedness. Patient explained of similar situation 2 years ago with GI bleeding where he had to have his Coumadin reversal done.  Objective: Vitals:   01/07/20 1212 01/07/20 1223 01/07/20 1238 01/07/20 1305  BP: 116/72 113/69 121/64 110/86  Pulse: 70 75 74 74  Resp: 19 20 20 19   Temp: 98 F (36.7 C) (!) 97.5 F (36.4 C) 98 F (36.7 C)   TempSrc: Oral Oral Oral   SpO2: 98%   95%  Weight:      Height:        Intake/Output Summary (Last 24 hours) at 01/07/2020 1334 Last data filed at 01/07/2020 1212 Gross per 24 hour  Intake 797.83 ml  Output 225 ml  Net 572.83 ml   Filed Weights   01/06/20 1556    Weight: 74.8 kg    Examination:  General exam: Appears calm and comfortable while sitting in chair. Respiratory system: Clear to auscultation. Respiratory effort normal.  No added sound. Cardiovascular system: S1 & S2 heard, RRR. No JVD, murmurs, rubs, gallops or clicks. No pedal edema.  ICD present left precordium. Gastrointestinal system:  Abdomen is soft. He has visible firm to hard swelling, about 7 cm x 5 cm in size left paramedian area.  Rest of the abdomen is soft. Central nervous system: Alert and oriented. No focal neurological deficits. Extremities: Symmetric 5 x 5 power. Skin: No rashes, lesions or ulcers Psychiatry: Judgement and insight appear normal. Mood & affect appropriate.     Data Reviewed: I have personally reviewed following labs and imaging studies  CBC: Recent Labs  Lab 01/06/20 1643 01/06/20 2343 01/07/20 0420  WBC 15.4* 18.4* 17.0*  HGB 13.9 12.4* 10.9*  HCT 43.1 38.2* 33.7*  MCV 99.1 99.5 99.7  PLT 229 214 076   Basic Metabolic Panel: Recent Labs  Lab 01/06/20 1643 01/07/20 0420  NA 137 136  K 4.5 4.4  CL 102 102  CO2 22 24  GLUCOSE 141* 149*  BUN 14 20  CREATININE 1.09 1.32*  CALCIUM 9.3 9.0   GFR: Estimated Creatinine Clearance: 42.5 mL/min (A) (by C-G formula based on SCr of 1.32 mg/dL (H)). Liver Function Tests: Recent Labs  Lab 01/06/20 1643  AST 22  ALT 16  ALKPHOS 64  BILITOT 1.3*  PROT 7.2  ALBUMIN 3.8   Recent Labs  Lab 01/06/20 1643  LIPASE 31   No results for input(s): AMMONIA in the last 168 hours. Coagulation Profile: Recent Labs  Lab 01/06/20 1643 01/07/20 0420  INR 4.7* 2.2*   Cardiac Enzymes: No results for input(s): CKTOTAL, CKMB, CKMBINDEX, TROPONINI in the last 168 hours. BNP (last 3 results) No results for input(s): PROBNP in the last 8760 hours. HbA1C: No results for input(s): HGBA1C in the last 72 hours. CBG: No results for input(s): GLUCAP in the last 168 hours. Lipid Profile: No  results for input(s): CHOL, HDL, LDLCALC, TRIG, CHOLHDL, LDLDIRECT in the last 72 hours. Thyroid Function Tests: No results for input(s): TSH, T4TOTAL, FREET4, T3FREE, THYROIDAB in the last 72 hours. Anemia Panel: No results for input(s): VITAMINB12, FOLATE, FERRITIN, TIBC, IRON, RETICCTPCT in the last 72 hours. Sepsis Labs: No results for input(s): PROCALCITON, LATICACIDVEN in the last 168 hours.  Recent Results (from the past 240 hour(s))  SARS Coronavirus 2 by RT PCR (hospital order, performed in [facility] hospital lab) Nasopharyngeal Nasopharyngeal Swab     Status: Abnormal   Collection Time: 01/06/20  9:05 PM   Specimen: Nasopharyngeal Swab  Result Value Ref Range Status   SARS Coronavirus 2 POSITIVE (A) NEGATIVE Final    Comment: RESULT CALLED TO, READ BACK BY AND VERIFIED WITH:  RN S MULDOON AT 2248 01/06/20 CRUICKSHANK A (NOTE) SARS-CoV-2 target nucleic acids are DETECTED  SARS-CoV-2 RNA is generally detectable in upper respiratory specimens  during the acute phase of infection.  Positive results are indicative  of the presence of the identified virus, but do not rule out bacterial infection or co-infection with other pathogens not detected by the test.  Clinical correlation with patient history and  other diagnostic information is necessary to determine patient infection status.  The expected result is negative.  Fact Sheet for Patients:   StrictlyIdeas.no   Fact Sheet for Healthcare Providers:   BankingDealers.co.za    This test is not yet approved or cleared by the [place] FDA and  has been authorized for detection and/or diagnosis of SARS-CoV-2 by FDA under an Emergency Use Authorization (EUA).  This EUA will remain in effect (meaning  this test can be used) for the duration of  the COVID-19 declaration under Section 564(b)(1) of the Act, 21 U.S.C. section 360-bbb-3(b)(1), unless the authorization is terminated or  revoked sooner.  Performed at [facility], [place] [address], [place], [place] [address]          Radiology Studies: CT Abdomen Pelvis W Contrast  Result Date: 01/06/2020 CLINICAL DATA:  Abdominal distension. EXAM: CT ABDOMEN AND PELVIS WITH CONTRAST TECHNIQUE: Multidetector CT imaging of the abdomen and pelvis was performed using the standard protocol following bolus administration of intravenous contrast. CONTRAST:  135mL OMNIPAQUE IOHEXOL 300 MG/ML  SOLN COMPARISON:  None. FINDINGS: Lower chest: There is atelectasis at the lung bases, left greater than right.The heart is enlarged. Hepatobiliary: The liver is normal. Normal gallbladder.There is no biliary ductal dilation. Pancreas: Normal contours without ductal dilatation. No peripancreatic fluid collection. Spleen: Unremarkable. Adrenals/Urinary Tract: --Adrenal glands: Unremarkable. --Right kidney/ureter: There is a nonobstructing stone in the interpolar region of the right kidney measuring approximately 4 mm. There is a cyst in the interpolar region with a thin peripheral calcification. --Left kidney/ureter: No hydronephrosis or radiopaque kidney stones. --Urinary bladder: The urinary bladder is poorly evaluated secondary to extensive streak artifact through the patient's pelvis. Stomach/Bowel: --Stomach/Duodenum: There is a small hiatal hernia. --Small bowel: Unremarkable. --Colon: There are colonic diverticula, greatest at the level of the sigmoid colon, without CT evidence for diverticulitis. --Appendix: Not visualized. No right lower quadrant inflammation or free fluid. Vascular/Lymphatic: Atherosclerotic calcification is present within the non-aneurysmal abdominal aorta, without hemodynamically significant stenosis. --No retroperitoneal lymphadenopathy. --No mesenteric lymphadenopathy. --No pelvic or inguinal lymphadenopathy. Reproductive: Unremarkable Other: No ascites or free air. There is a large left rectus sheath  hematoma measuring approximately 7.1 by 5.9 by 21 cm. This hematoma has ruptured into the preperitoneal space and pelvis. There is evidence for areas of active extravasation within the hematoma. Musculoskeletal. The patient is status post prior bilateral total hip arthroplasty. There is no acute displaced fracture. IMPRESSION: 1. Large left rectus sheath hematoma with evidence for active extravasation within the hematoma. 2. Colonic diverticulosis without CT evidence for diverticulitis. 3. There is a nonobstructing right-sided nephroliths as detailed above. 4. Cardiomegaly. Aortic Atherosclerosis (ICD10-I70.0). These results were called by telephone at the time of interpretation on 01/06/2020 at 8:22 pm to provider Lacretia Leigh , who verbally acknowledged these results. Electronically Signed   By: [name] M.D.   On: 01/06/2020 20:26        Scheduled Meds: . benzonatate  100 mg Oral TID  . carvedilol  9.375 mg Oral BID  . digoxin  125 mcg Oral QPM  .  polyethylene glycol  17 g Oral Daily  . rosuvastatin  10 mg Oral QPM  . sodium chloride flush  3 mL Intravenous Q12H   Continuous Infusions: . methocarbamol (ROBAXIN) IV       LOS: 1 day    Time spent: 35 minutes    Barb Merino, MD [facility] Pager [phone]

## 2020-01-08 DIAGNOSIS — E440 Moderate protein-calorie malnutrition: Secondary | ICD-10-CM | POA: Insufficient documentation

## 2020-01-08 DIAGNOSIS — D62 Acute posthemorrhagic anemia: Secondary | ICD-10-CM

## 2020-01-08 LAB — CBC
HCT: 25.9 % — ABNORMAL LOW (ref 39.0–52.0)
HCT: 28.6 % — ABNORMAL LOW (ref 39.0–52.0)
Hemoglobin: 8.3 g/dL — ABNORMAL LOW (ref 13.0–17.0)
Hemoglobin: 9.5 g/dL — ABNORMAL LOW (ref 13.0–17.0)
MCH: 31.8 pg (ref 26.0–34.0)
MCH: 33 pg (ref 26.0–34.0)
MCHC: 32 g/dL (ref 30.0–36.0)
MCHC: 33.2 g/dL (ref 30.0–36.0)
MCV: 99.2 fL (ref 80.0–100.0)
MCV: 99.3 fL (ref 80.0–100.0)
Platelets: 146 10*3/uL — ABNORMAL LOW (ref 150–400)
Platelets: 172 10*3/uL (ref 150–400)
RBC: 2.61 MIL/uL — ABNORMAL LOW (ref 4.22–5.81)
RBC: 2.88 MIL/uL — ABNORMAL LOW (ref 4.22–5.81)
RDW: 14 % (ref 11.5–15.5)
RDW: 13.9 % (ref 11.5–15.5)
WBC: 9.7 10*3/uL (ref 4.0–10.5)
WBC: 12.6 10*3/uL — ABNORMAL HIGH (ref 4.0–10.5)
nRBC: 0 % (ref 0.0–0.2)
nRBC: 0 % (ref 0.0–0.2)

## 2020-01-08 LAB — BASIC METABOLIC PANEL
Anion gap: 10 (ref 5–15)
BUN: 20 mg/dL (ref 8–23)
CO2: 25 mmol/L (ref 22–32)
Calcium: 8.1 mg/dL — ABNORMAL LOW (ref 8.9–10.3)
Chloride: 100 mmol/L (ref 98–111)
Creatinine, Ser: 0.89 mg/dL (ref 0.61–1.24)
GFR calc Af Amer: >60 mL/min (ref >60)
GFR calc non Af Amer: >60 mL/min (ref >60)
Glucose, Bld: 103 mg/dL — ABNORMAL HIGH (ref 70–99)
Potassium: 3.8 mmol/L (ref 3.5–5.1)
Sodium: 135 mmol/L (ref 135–145)

## 2020-01-08 LAB — BPAM FFP
Blood Product Expiration Date: 202109132359
Blood Product Expiration Date: 202109132359
Blood Product Expiration Date: 202109142359
Blood Product Expiration Date: 202109142359
ISSUE DATE / TIME: 202109090034
ISSUE DATE / TIME: 202109090206
ISSUE DATE / TIME: 202109091115
ISSUE DATE / TIME: 202109091210
Unit Type and Rh: 5100
Unit Type and Rh: 5100
Unit Type and Rh: 5100
Unit Type and Rh: 5100

## 2020-01-08 LAB — PREPARE FRESH FROZEN PLASMA
Unit division: 0
Unit division: 0

## 2020-01-08 LAB — PROTIME-INR
INR: 1.4 — ABNORMAL HIGH (ref 0.8–1.2)
Prothrombin Time: 16.2 seconds — ABNORMAL HIGH (ref 11.4–15.2)

## 2020-01-08 MED ORDER — ALUM & MAG HYDROXIDE-SIMETH 200-200-20 MG/5ML PO SUSP
30.0000 mL | ORAL | Status: DC | PRN
  Administered 2020-01-08: 30 mL via ORAL
  Filled 2020-01-08: qty 30

## 2020-01-08 MED ORDER — SODIUM CHLORIDE 0.9 % IV BOLUS
250.0000 mL | Freq: Once | INTRAVENOUS | Status: AC
  Administered 2020-01-08: 250 mL via INTRAVENOUS

## 2020-01-08 MED ORDER — CARVEDILOL 6.25 MG PO TABS
6.2500 mg | ORAL_TABLET | Freq: Two times a day (BID) | ORAL | Status: DC
  Administered 2020-01-09 – 2020-01-12 (×7): 6.25 mg via ORAL
  Filled 2020-01-08 (×7): qty 1

## 2020-01-08 MED ORDER — CARVEDILOL 6.25 MG PO TABS: 9.3750 mg | ORAL_TABLET | Freq: Two times a day (BID) | ORAL | Status: DC

## 2020-01-08 MED ORDER — ADULT MULTIVITAMIN W/MINERALS CH
1.0000 | ORAL_TABLET | Freq: Every day | ORAL | Status: DC
  Administered 2020-01-08 – 2020-01-12 (×5): 1 via ORAL
  Filled 2020-01-08 (×5): qty 1

## 2020-01-08 MED ORDER — KATE FARMS STANDARD 1.4 PO LIQD
325.0000 mL | Freq: Every day | ORAL | Status: DC
  Administered 2020-01-08 – 2020-01-12 (×2): 325 mL via ORAL
  Filled 2020-01-08 (×5): qty 325

## 2020-01-08 NOTE — Evaluation (Signed)
Physical Therapy Evaluation Patient Details Name: Andrew Vance MRN: [id number] DOB: 1935/08/15 Today's Date: 01/08/2020   History of Present Illness  84 year old gentleman with history of hypertension, chronic A. fib on Coumadin, chronic systolic heart failure, tachybradycardia syndrome status post ICD pacemaker in place presented to the ER with abdominal pain.  Patient reported having Covid infection about 3 weeks ago.  Pt admitted for Rectus sheath hematoma, symptomatic with pain and swelling due to coagulopathy with use of Coumadin and supratherapeutic INR,  Acute blood loss anemia  Clinical Impression  Pt admitted with above diagnosis.  Pt currently with functional limitations due to the deficits listed below (see PT Problem List). Pt will benefit from skilled PT to increase their independence and safety with mobility to allow discharge to the venue listed below.  Pt agreeable to ambulate and performed 100 feet without assistive device in hallway. Pt with wide BOS and holding abdomen during ambulation.  Pt would benefit from HHPT upon d/c if agreeable (pt states he will consider it).   Follow Up Recommendations Home health PT    Equipment Recommendations  None recommended by PT    Recommendations for Other Services       Precautions / Restrictions Precautions Precaution Comments: Rectus sheath hematoma      Mobility  Bed Mobility Overal bed mobility: Needs Assistance Bed Mobility: Supine to Sit;Sit to Supine     Supine to sit: Min guard Sit to supine: Supervision   General bed mobility comments: pt provided a hand per request to pull upright  Transfers Overall transfer level: Needs assistance Equipment used: None Transfers: Sit to/from Stand Sit to Stand: Min guard            Ambulation/Gait Ambulation/Gait assistance: Min guard Gait Distance (Feet): 100 Feet Assistive device: None Gait Pattern/deviations: Step-through pattern;Decreased stride length;Wide base  of support     General Gait Details: pt declined assistive device, pt held abdomen for pain control, distance to tolerance  Stairs            Wheelchair Mobility    Modified Rankin (Stroke Patients Only)       Balance Overall balance assessment: Needs assistance         Standing balance support: No upper extremity supported Standing balance-Leahy Scale: Fair                               Pertinent Vitals/Pain Pain Assessment: Faces Faces Pain Scale: Hurts little more Pain Location: abdomen with coughing Pain Descriptors / Indicators: Sore Pain Intervention(s): Monitored during session    Home Living Family/patient expects to be discharged to:: Private residence Living Arrangements: Spouse/significant other   Type of Home: House Home Access: Stairs to enter Entrance Stairs-Rails: None Technical brewer of Steps: 2 Home Layout: One level Home Equipment: Environmental consultant - 2 wheels      Prior Function Level of Independence: Independent               Hand Dominance        Extremity/Trunk Assessment        Lower Extremity Assessment Lower Extremity Assessment: Generalized weakness       Communication   Communication: No difficulties  Cognition Arousal/Alertness: Awake/alert Behavior During Therapy: WFL for tasks assessed/performed Overall Cognitive Status: Within Functional Limits for tasks assessed  General Comments      Exercises     Assessment/Plan    PT Assessment Patient needs continued PT services  PT Problem List Decreased strength;Decreased mobility;Decreased activity tolerance;Decreased balance;Decreased knowledge of use of DME       PT Treatment Interventions DME instruction;Therapeutic exercise;Gait training;Balance training;Functional mobility training;Therapeutic activities;Patient/family education    PT Goals (Current goals can be found in the Care Plan  section)  Acute Rehab PT Goals PT Goal Formulation: With patient Time For Goal Achievement: 01/22/20 Potential to Achieve Goals: Good    Frequency Min 3X/week   Barriers to discharge        Co-evaluation               AM-PAC PT "6 Clicks" Mobility  Outcome Measure Help needed turning from your back to your side while in a flat bed without using bedrails?: A Little Help needed moving from lying on your back to sitting on the side of a flat bed without using bedrails?: A Little Help needed moving to and from a bed to a chair (including a wheelchair)?: A Little Help needed standing up from a chair using your arms (e.g., wheelchair or bedside chair)?: A Little Help needed to walk in hospital room?: A Little Help needed climbing 3-5 steps with a railing? : A Little 6 Click Score: 18    End of Session Equipment Utilized During Treatment: Gait belt Activity Tolerance: Patient tolerated treatment well Patient left: with call bell/phone within reach;in chair;with family/visitor present   PT Visit Diagnosis: Other abnormalities of gait and mobility (R26.89);Unsteadiness on feet (R26.81)    Time: 0981-1914 PT Time Calculation (min) (ACUTE ONLY): 19 min   Charges:   PT Evaluation $PT Eval Low Complexity: 1 Low        Kati PT, DPT Acute Rehabilitation Services Pager: [phone] Office: [phone]  York Ram E 01/08/2020, 4:04 PM

## 2020-01-08 NOTE — Progress Notes (Signed)
PROGRESS NOTE    Andrew Vance  JME:[id number] DOB: 11-01-1935 DOA: 01/06/2020 PCP: Laurey Morale, MD    Brief Narrative:  84 year old gentleman with history of hypertension, chronic A. fib on Coumadin, chronic systolic heart failure, tachybradycardia syndrome status post ICD pacemaker in place presented to the ER with abdominal pain.  Patient reported having Covid infection about 3 weeks ago. COVID-19 positive on 8/19 at [facility], reported attached, 21 days today. He is recovering from COVID-19 with ongoing lingering cough, decreased oral intake.  About 10 days ago began having diffuse abdominal pain mostly localized on left lower quadrant.  Went to primary care doctor's office yesterday and found to have a hematoma on clinical exam and sent to ER. In the emergency room vital signs are stable.  INR 4.7.  A CT scan abdomen pelvis showed large left rectus sheath hematoma with evidence of active extravasation.  Was given 10 mg of vitamin K and 2 units of FFP. Admitting provider has documented that case was discussed with radiology and recommended conservative management with reversal of Coumadin.   Assessment & Plan:   Active Problems:   HYPERTENSION, BENIGN   PSORIATIC ARTHRITIS   Atrial fibrillation (HCC)   Cardiomyopathy (HCC)   Pacemaker-DDD  [facility]   Tachy-brady syndrome (HCC)   Chronic systolic heart failure (HCC)   Rectus sheath hematoma   Rectus sheath hematoma, symptomatic with pain and swelling due to coagulopathy with use of Coumadin and supratherapeutic INR.  Acute blood loss anemia -Vitamin K 10 mg and 4 units FFP given.  INR was 4.7.  Improved to 1.4 this morning.   Patient continues to have significant pain.  Worsens with movement.  Hemoglobin has dropped some.  Down to 8.3 today.  We will recheck later today.  Will transfuse if it drops below 8.  Recent COVID-19 infection Patient symptoms started about 3 weeks ago.  They have significantly improved.  He has completed 3  weeks from his initial positive test.  He is afebrile.  He can be taken off of isolation.    Chronic A. Fib Coumadin will be discontinued until clinical stabilization and reevaluation.  Continue digoxin.  Rate controlled.  On Coreg and digoxin. Patient does have ICD pacemaker and is stable.  No mechanical valve.  Chronic systolic heart failure: Remains stable.  No evidence for fluid overload.  No recent echocardiogram noted in the system.  The one from 2016 shows a EF of 25 to 30%.  Patient noted to be on carvedilol and digoxin.  Not noted to be on ACE inhibitor or ARB.  Will defer this to outpatient providers.   DVT prophylaxis: SCDs for now Code Status: DNR Family Communication: None Disposition Plan: Start mobilizing.  PT and OT evaluation.  Status is: Inpatient  Remains inpatient appropriate because:IV treatments appropriate due to intensity of illness or inability to take PO and Inpatient level of care appropriate due to severity of illness   Dispo:  Patient From: Home  Planned Disposition: Home  Expected discharge date: 01/08/20  Medically stable for discharge: No   Consultants:   None  Procedures:   None  Antimicrobials:   None   Subjective: Continues to have abdominal pain.  6 out of 10 in intensity.  Denies any cough shortness of breath.  Objective: Vitals:   01/08/20 0300 01/08/20 0748 01/08/20 0816 01/08/20 0844  BP: 112/72 116/79 112/75 111/76  Pulse: 79 83 83 70  Resp:  16 (!) 22 16  Temp:  97.8  F (36.6 C) 98.1 F (36.7 C) 98.5 F (36.9 C)  TempSrc:  Oral Oral Oral  SpO2: 94% 95% 95% 96%  Weight:      Height:        Intake/Output Summary (Last 24 hours) at 01/08/2020 1138 Last data filed at 01/08/2020 1039 Gross per 24 hour  Intake 721.67 ml  Output 125 ml  Net 596.67 ml   Filed Weights   01/06/20 1556  Weight: 74.8 kg    Examination:  General appearance: Awake alert.  In no distress Resp: Clear to auscultation bilaterally.  Normal  effort Cardio: S1-S2 is normal regular.  No S3-S4.  No rubs murmurs or bruit GI: Abdomen is soft.  Bruising is noted.  Tender to palpation.  No rebound or guarding.  Mostly in the abdominal wall musculature.   No edema in the lower extremities. Neurologic: Alert and oriented x3.  No focal neurological deficits.     Data Reviewed: I have personally reviewed following labs and imaging studies  CBC: Recent Labs  Lab 01/06/20 1643 01/06/20 2343 01/07/20 0420 01/07/20 1712 01/08/20 0432  WBC 15.4* 18.4* 17.0* 11.4* 9.7  HGB 13.9 12.4* 10.9* 9.5* 8.3*  HCT 43.1 38.2* 33.7* 28.9* 25.9*  MCV 99.1 99.5 99.7 99.3 99.2  PLT 229 214 188 157 932*   Basic Metabolic Panel: Recent Labs  Lab 01/06/20 1643 01/07/20 0420 01/08/20 0432  NA 137 136 135  K 4.5 4.4 3.8  CL 102 102 100  CO2 22 24 25   GLUCOSE 141* 149* 103*  BUN 14 20 20   CREATININE 1.09 1.32* 0.89  CALCIUM 9.3 9.0 8.1*   GFR: Estimated Creatinine Clearance: 63 mL/min (by C-G formula based on SCr of 0.89 mg/dL). Liver Function Tests: Recent Labs  Lab 01/06/20 1643  AST 22  ALT 16  ALKPHOS 64  BILITOT 1.3*  PROT 7.2  ALBUMIN 3.8   Recent Labs  Lab 01/06/20 1643  LIPASE 31   Coagulation Profile: Recent Labs  Lab 01/06/20 1643 01/07/20 0420 01/07/20 1712 01/08/20 0432  INR 4.7* 2.2* 1.5* 1.4*     Recent Results (from the past 240 hour(s))  SARS Coronavirus 2 by RT PCR (hospital order, performed in [facility] hospital lab) Nasopharyngeal Nasopharyngeal Swab     Status: Abnormal   Collection Time: 01/06/20  9:05 PM   Specimen: Nasopharyngeal Swab  Result Value Ref Range Status   SARS Coronavirus 2 POSITIVE (A) NEGATIVE Final    Comment: RESULT CALLED TO, READ BACK BY AND VERIFIED WITH: RN S MULDOON AT 2248 01/06/20 CRUICKSHANK A (NOTE) SARS-CoV-2 target nucleic acids are DETECTED  SARS-CoV-2 RNA is generally detectable in upper respiratory specimens  during the acute phase of infection.  Positive  results are indicative  of the presence of the identified virus, but do not rule out bacterial infection or co-infection with other pathogens not detected by the test.  Clinical correlation with patient history and  other diagnostic information is necessary to determine patient infection status.  The expected result is negative.  Fact Sheet for Patients:   StrictlyIdeas.no   Fact Sheet for Healthcare Providers:   BankingDealers.co.za    This test is not yet approved or cleared by the [place] FDA and  has been authorized for detection and/or diagnosis of SARS-CoV-2 by FDA under an Emergency Use Authorization (EUA).  This EUA will remain in effect (meaning  this test can be used) for the duration of  the COVID-19 declaration under Section 564(b)(1) of the Act,  21 U.S.C. section 360-bbb-3(b)(1), unless the authorization is terminated or revoked sooner.  Performed at [facility], [place] [address], [place], [place] [address]          Radiology Studies: CT Abdomen Pelvis W Contrast  Result Date: 01/06/2020 CLINICAL DATA:  Abdominal distension. EXAM: CT ABDOMEN AND PELVIS WITH CONTRAST TECHNIQUE: Multidetector CT imaging of the abdomen and pelvis was performed using the standard protocol following bolus administration of intravenous contrast. CONTRAST:  170mL OMNIPAQUE IOHEXOL 300 MG/ML  SOLN COMPARISON:  None. FINDINGS: Lower chest: There is atelectasis at the lung bases, left greater than right.The heart is enlarged. Hepatobiliary: The liver is normal. Normal gallbladder.There is no biliary ductal dilation. Pancreas: Normal contours without ductal dilatation. No peripancreatic fluid collection. Spleen: Unremarkable. Adrenals/Urinary Tract: --Adrenal glands: Unremarkable. --Right kidney/ureter: There is a nonobstructing stone in the interpolar region of the right kidney measuring approximately 4 mm. There is a cyst in the  interpolar region with a thin peripheral calcification. --Left kidney/ureter: No hydronephrosis or radiopaque kidney stones. --Urinary bladder: The urinary bladder is poorly evaluated secondary to extensive streak artifact through the patient's pelvis. Stomach/Bowel: --Stomach/Duodenum: There is a small hiatal hernia. --Small bowel: Unremarkable. --Colon: There are colonic diverticula, greatest at the level of the sigmoid colon, without CT evidence for diverticulitis. --Appendix: Not visualized. No right lower quadrant inflammation or free fluid. Vascular/Lymphatic: Atherosclerotic calcification is present within the non-aneurysmal abdominal aorta, without hemodynamically significant stenosis. --No retroperitoneal lymphadenopathy. --No mesenteric lymphadenopathy. --No pelvic or inguinal lymphadenopathy. Reproductive: Unremarkable Other: No ascites or free air. There is a large left rectus sheath hematoma measuring approximately 7.1 by 5.9 by 21 cm. This hematoma has ruptured into the preperitoneal space and pelvis. There is evidence for areas of active extravasation within the hematoma. Musculoskeletal. The patient is status post prior bilateral total hip arthroplasty. There is no acute displaced fracture. IMPRESSION: 1. Large left rectus sheath hematoma with evidence for active extravasation within the hematoma. 2. Colonic diverticulosis without CT evidence for diverticulitis. 3. There is a nonobstructing right-sided nephroliths as detailed above. 4. Cardiomegaly. Aortic Atherosclerosis (ICD10-I70.0). These results were called by telephone at the time of interpretation on 01/06/2020 at 8:22 pm to provider Lacretia Leigh , who verbally acknowledged these results. Electronically Signed   By: [name] M.D.   On: 01/06/2020 20:26        Scheduled Meds: . benzonatate  100 mg Oral TID  . carvedilol  9.375 mg Oral BID  . digoxin  125 mcg Oral QPM  . polyethylene glycol  17 g Oral Daily  . rosuvastatin   10 mg Oral QPM  . sodium chloride flush  3 mL Intravenous Q12H   Continuous Infusions: . methocarbamol (ROBAXIN) IV       LOS: 2 days     Bonnielee Haff, MD [facility]

## 2020-01-08 NOTE — Progress Notes (Signed)
Initial Nutrition Assessment  DOCUMENTATION CODES:   Non-severe (moderate) malnutrition in context of acute illness/injury  INTERVENTION:  - will order [employer] 1.4 po once/day, each supplement provides 455 kcal and 20 grams protein. - will order Magic Cup with dinner meals, each supplement provides 290 kcal and 9 grams of protein. - will order 1 tablet multivitamin with minerals/day.  NUTRITION DIAGNOSIS:   Moderate Malnutrition related to acute illness (COVID-19 infection) as evidenced by mild fat depletion, mild muscle depletion, moderate muscle depletion.  GOAL:   Patient will meet greater than or equal to 90% of their needs  MONITOR:   PO intake, Supplement acceptance, Labs, Weight trends  REASON FOR ASSESSMENT:   Malnutrition Screening Tool  ASSESSMENT:   84 year old male with medical history of HTN, chronic A. fib on Coumadin, chronic systolic heart failure, tachybradycardia syndrome s/p ICD pacemaker in place. Patient reported that he tested [facility] He presented to the ED with abdominal pain x10 days, ongoing lingering cough, and decreased appetite and oral intake.  Patient ate scrambled egg and a banana for breakfast this AM. He reports eating lunch but is unable to recall what or how much he ate.   He was previously very active to include mowing his large lawn and going into an office to work a half day each week day. He was diagnosed with COVID-19 three weeks ago and since that time has had an overall decline in health and physical function.  He reports that taste and smell have been altered and that this has contributed to poor appetite. He understands the importance of eating well and states that he does have to force himself to eat. He has been very weak compared to his baseline and has only been able to complete necessary ADLs each day.  He reports ongoing cough and abdominal pain. States abdominal pain is a 4-5 on 10 point scale and  that when he coughs or clears his throat, it "shoots off the charts". He also experiences severe abdominal pain when he attempts to sit upright or get out of bed. No increase in pain with PO intakes.   Weight today is 172 lb, weight on 9/8 was documented as 165 lb, and PTA the most recently documented weight was on 08/25/19 when he weighed 179 lb. This indicates 7 lb weight loss (4% body weight); not significant for time frame.    Labs reviewed; Ca: 8.1 mg/dl. Medications reviewed; 17 g miralax/day.     NUTRITION - FOCUSED PHYSICAL EXAM:    Most Recent Value  Orbital Region Mild depletion  Upper Arm Region Moderate depletion  Thoracic and Lumbar Region No depletion  Buccal Region No depletion  Temple Region Mild depletion  Clavicle Bone Region Moderate depletion  Clavicle and Acromion Bone Region Moderate depletion  Scapular Bone Region Unable to assess  Dorsal Hand Mild depletion  Patellar Region Mild depletion  Anterior Thigh Region Unable to assess  Posterior Calf Region Mild depletion  Edema (RD Assessment) Unable to assess  Hair Reviewed  Eyes Reviewed  Mouth Reviewed  Skin Reviewed  Nails Reviewed       Diet Order:   Diet Order            Diet regular Room service appropriate? Yes; Fluid consistency: Thin  Diet effective now                 EDUCATION NEEDS:   No education needs have been identified at this time  Skin:  Skin Assessment: Skin Integrity Issues: Skin Integrity Issues:: Incisions Incisions: abdomen (9/8)  Last BM:  9/7  Height:   Ht Readings from Last 1 Encounters:  01/08/20 5\' 8"  (1.727 m)    Weight:   Wt Readings from Last 1 Encounters:  01/08/20 78 kg     Estimated Nutritional Needs:  Kcal:  1650-1850 kcal Protein:  80-90 grams Fluid:  >/= 1.8 L/day     Andrew Matin, MS, RD, LDN, CNSC Inpatient Clinical Dietitian RD pager # available in [facility]  After hours/weekend pager # available in [facility]

## 2020-01-08 NOTE — Plan of Care (Signed)

## 2020-01-08 NOTE — Care Management Important Message (Signed)
Important Message  Patient Details IM Letter given to the Patient Name: Andrew Vance MRN: [id number] Date of Birth: April 18, 1936   Medicare Important Message Given:  Yes     Kerin Salen 01/08/2020, 11:59 AM

## 2020-01-09 DIAGNOSIS — T148XXA Other injury of unspecified body region, initial encounter: Secondary | ICD-10-CM

## 2020-01-09 LAB — CBC
HCT: 26.8 % — ABNORMAL LOW (ref 39.0–52.0)
Hemoglobin: 8.9 g/dL — ABNORMAL LOW (ref 13.0–17.0)
MCH: 32.2 pg (ref 26.0–34.0)
MCHC: 33.2 g/dL (ref 30.0–36.0)
MCV: 97.1 fL (ref 80.0–100.0)
Platelets: 155 10*3/uL (ref 150–400)
RBC: 2.76 MIL/uL — ABNORMAL LOW (ref 4.22–5.81)
RDW: 13.9 % (ref 11.5–15.5)
WBC: 9.5 10*3/uL (ref 4.0–10.5)
nRBC: 0 % (ref 0.0–0.2)

## 2020-01-09 LAB — BASIC METABOLIC PANEL
Anion gap: 13 (ref 5–15)
BUN: 23 mg/dL (ref 8–23)
CO2: 26 mmol/L (ref 22–32)
Calcium: 9.2 mg/dL (ref 8.9–10.3)
Chloride: 92 mmol/L — ABNORMAL LOW (ref 98–111)
Creatinine, Ser: 0.83 mg/dL (ref 0.61–1.24)
GFR calc Af Amer: >60 mL/min (ref >60)
GFR calc non Af Amer: >60 mL/min (ref >60)
Glucose, Bld: 104 mg/dL — ABNORMAL HIGH (ref 70–99)
Potassium: 4.3 mmol/L (ref 3.5–5.1)
Sodium: 131 mmol/L — ABNORMAL LOW (ref 135–145)

## 2020-01-09 MED ORDER — SODIUM CHLORIDE 0.9 % IV SOLN
12.5000 mg | Freq: Four times a day (QID) | INTRAVENOUS | Status: DC | PRN
  Administered 2020-01-09 – 2020-01-11 (×3): 12.5 mg via INTRAVENOUS
  Filled 2020-01-09 (×4): qty 0.5

## 2020-01-09 MED ORDER — LACTULOSE 10 GM/15ML PO SOLN
20.0000 g | Freq: Two times a day (BID) | ORAL | Status: DC
  Administered 2020-01-09 (×2): 20 g via ORAL
  Filled 2020-01-09 (×2): qty 30

## 2020-01-09 MED ORDER — FAMOTIDINE 20 MG PO TABS
20.0000 mg | ORAL_TABLET | Freq: Every day | ORAL | Status: DC
  Administered 2020-01-09 – 2020-01-12 (×4): 20 mg via ORAL
  Filled 2020-01-09 (×4): qty 1

## 2020-01-09 MED ORDER — SENNOSIDES-DOCUSATE SODIUM 8.6-50 MG PO TABS
1.0000 | ORAL_TABLET | Freq: Two times a day (BID) | ORAL | Status: DC
  Administered 2020-01-09 – 2020-01-11 (×5): 1 via ORAL
  Filled 2020-01-09 (×5): qty 1

## 2020-01-09 MED ORDER — METHOCARBAMOL 500 MG PO TABS
500.0000 mg | ORAL_TABLET | Freq: Three times a day (TID) | ORAL | Status: DC
  Administered 2020-01-09 – 2020-01-10 (×3): 500 mg via ORAL
  Filled 2020-01-09 (×3): qty 1

## 2020-01-09 MED ORDER — OXYCODONE HCL 5 MG PO TABS: 5.0000 mg | ORAL_TABLET | ORAL | Status: DC | PRN

## 2020-01-09 NOTE — Progress Notes (Signed)
Physical Therapy Treatment Patient Details Name: Andrew Vance MRN: [id number] DOB: 05-26-1935 Today's Date: 01/09/2020    History of Present Illness 84 year old gentleman with history of hypertension, chronic A. fib on Coumadin, chronic systolic heart failure, tachybradycardia syndrome status post ICD pacemaker in place presented to the ER with abdominal pain.  Patient reported having Covid infection about 3 weeks ago.  Pt admitted for Rectus sheath hematoma, symptomatic with pain and swelling due to coagulopathy with use of Coumadin and supratherapeutic INR,  Acute blood loss anemia    PT Comments    Pt ambulated in hallway and not requiring assistive device.  Pt with slow cautious gait however no unsteadiness or LOB observed.   Follow Up Recommendations  Home health PT     Equipment Recommendations  None recommended by PT    Recommendations for Other Services       Precautions / Restrictions Precautions Precaution Comments: Rectus sheath hematoma    Mobility  Bed Mobility Overal bed mobility: Modified Independent Bed Mobility: Supine to Sit;Sit to Supine           General bed mobility comments: Patient using trapeze bar for bed mobility.  Discussed log roll technique for home however pt likely to sleep in recliner chair  Transfers Overall transfer level: Needs assistance Equipment used: None Transfers: Sit to/from Stand Sit to Stand: Supervision         General transfer comment: supervision for safety  Ambulation/Gait Ambulation/Gait assistance: Min guard;Supervision Gait Distance (Feet): 200 Feet Assistive device: None Gait Pattern/deviations: Step-through pattern;Decreased stride length;Wide base of support     General Gait Details: pt declined assistive device, pt held abdomen for pain control, distance to tolerance   Stairs             Wheelchair Mobility    Modified Rankin (Stroke Patients Only)       Balance                                             Cognition Arousal/Alertness: Awake/alert Behavior During Therapy: WFL for tasks assessed/performed Overall Cognitive Status: Within Functional Limits for tasks assessed                                        Exercises      General Comments        Pertinent Vitals/Pain Pain Assessment: Faces Faces Pain Scale: Hurts little more Pain Location: abdomen Pain Descriptors / Indicators: Sore Pain Intervention(s): Repositioned;Monitored during session    Home Living                      Prior Function            PT Goals (current goals can now be found in the care plan section) Progress towards PT goals: Progressing toward goals    Frequency    Min 3X/week      PT Plan Current plan remains appropriate    Co-evaluation              AM-PAC PT "6 Clicks" Mobility   Outcome Measure  Help needed turning from your back to your side while in a flat bed without using bedrails?: A Little Help needed moving from lying on your back to sitting on  the side of a flat bed without using bedrails?: A Little Help needed moving to and from a bed to a chair (including a wheelchair)?: A Little Help needed standing up from a chair using your arms (e.g., wheelchair or bedside chair)?: A Little Help needed to walk in hospital room?: A Little Help needed climbing 3-5 steps with a railing? : A Little 6 Click Score: 18    End of Session Equipment Utilized During Treatment: Gait belt Activity Tolerance: Patient tolerated treatment well Patient left: in bed;with call bell/phone within reach;with family/visitor present   PT Visit Diagnosis: Other abnormalities of gait and mobility (R26.89);Unsteadiness on feet (R26.81)     Time: 1115-5208 PT Time Calculation (min) (ACUTE ONLY): 11 min  Charges:  $Gait Training: 8-22 mins                     Arlyce Dice, DPT Acute Rehabilitation Services Pager: [phone] Office:  [phone]  Trena Platt 01/09/2020, 4:27 PM

## 2020-01-09 NOTE — Progress Notes (Signed)
Pt had a 7 beat run of V-tach at 7:15 this shift. Pt remained asymptomatic throughout and is no acute distress. MD notified. Will continue to monitor.

## 2020-01-09 NOTE — Progress Notes (Addendum)
[facility] Progress Note  Patient: Andrew Vance    UUV:[id number]  DOA: 01/06/2020     Date of Service: the patient was seen and examined on 01/09/2020  Brief hospital course: Past medical history of hypertension, chronic A. fib on Coumadin, chronic systolic CHF, tachybradycardia syndrome SP ICD implant.  Presents with abdominal pain found to have rectus hematoma with acute blood loss anemia. Currently plan is continue close monitoring.  Assessment and Plan: Rectus sheath hematoma, symptomatic with pain and swelling due to coagulopathy with use of Coumadin and supratherapeutic INR.  Acute blood loss anemia Vitamin K 10 mg and 4 units FFP given.   INR was 4.7.  Improved to 1.4 Patient continues to have significant pain.  Worsens with movement.   Hemoglobin has dropped some.   transfuse if it drops below 8.  Recent COVID-19 infection Patient symptoms started about 3 weeks ago.  They have significantly improved.  He has completed 3 weeks from his initial positive test.  He is afebrile.  He can be taken off of isolation.    Chronic A. Fib Coumadin will be discontinued until clinical stabilization and reevaluation.  Continue digoxin.  Rate controlled.  On Coreg and digoxin. Patient does have ICD pacemaker and is stable.  No mechanical valve.  Chronic systolic heart failure: Remains stable.  No evidence for fluid overload.  No recent echocardiogram noted in the system.  The one from 2016 shows a EF of 25 to 30%.  Patient noted to be on carvedilol and digoxin.  Not noted to be on ACE inhibitor or ARB.  Will defer this to outpatient providers.  Hiccups. As needed Thorazine.  Acid reflux. Pepcid.  Constipation. Bowel regimen initiated.  Mild hyponatremia. Likely from poor p.o. intake due to severe constipation. Monitor.  Moderate protein calorie malnutrition. Nutrition Problem: Moderate Malnutrition Etiology: acute illness (COVID-19  infection) Interventions: Interventions: MVI, Magic cup, Other (Comment) [employer] 1.4 po)  Diet: Cardiac diet DVT Prophylaxis:   SCDs Start: 01/06/20 2248    Advance goals of care discussion: DNR  Family Communication: family was present at bedside, at the time of interview.  The pt provided permission to discuss medical plan with the family. Opportunity was given to ask question and all questions were answered satisfactorily.   Disposition:  Status is: Inpatient  Remains inpatient appropriate because:Inpatient level of care appropriate due to severity of illness   Dispo:  Patient From: Home  Planned Disposition: Home  Expected discharge date: 01/11/20  Medically stable for discharge: No  Subjective: Continues to have abdominal pain.  Reports that the pickups heartburn.  No nausea no vomiting.  Reports constipation.  Last bowel movement Monday.  Passing gas.  Physical Exam:  General: Appear in mild distress, no Rash; Oral Mucosa Clear, moist. no Abnormal Neck Mass Or lumps, Conjunctiva normal  Cardiovascular: S1 and S2 Present, no Murmur, Respiratory: good respiratory effort, Bilateral Air entry present and CTA, no Crackles, no wheezes Abdomen: Bowel Sound present, Soft and no tenderness Extremities: no Pedal edema Neurology: alert and oriented to time, place, and person affect appropriate. no new focal deficit Gait not checked dueto patient safety concerns     Vitals:   01/08/20 1643 01/08/20 2144 01/09/20 0526 01/09/20 1419  BP: 94/67 136/85 112/75 130/79  Pulse: 75 68 69 78  Resp: 16  16 16   Temp: 98.2 F (36.8 C) 99.1 F (37.3 C) 97.9 F (36.6 C) (!) 97.5 F (36.4 C)  TempSrc: Oral Oral  Oral  SpO2: 93% 97% 92% 94%  Weight:      Height:        Intake/Output Summary (Last 24 hours) at 01/09/2020 1927 Last data filed at 01/09/2020 1600 Gross per 24 hour  Intake 268 ml  Output --  Net 268 ml   Filed Weights   01/06/20 1556 01/08/20 0844  Weight:  74.8 kg 78 kg    Data Reviewed: I have personally reviewed and interpreted daily labs, tele strips, imagings as discussed above. I reviewed all nursing notes, pharmacy notes, vitals, pertinent old records I have discussed plan of care as described above with RN and patient/family.  CBC: Recent Labs  Lab 01/07/20 0420 01/07/20 1712 01/08/20 0432 01/08/20 1440 01/09/20 0546  WBC 17.0* 11.4* 9.7 12.6* 9.5  HGB 10.9* 9.5* 8.3* 9.5* 8.9*  HCT 33.7* 28.9* 25.9* 28.6* 26.8*  MCV 99.7 99.3 99.2 99.3 97.1  PLT 188 157 146* 172 211   Basic Metabolic Panel: Recent Labs  Lab 01/06/20 1643 01/07/20 0420 01/08/20 0432 01/09/20 0546  NA 137 136 135 131*  K 4.5 4.4 3.8 4.3  CL 102 102 100 92*  CO2 22 24 25 26   GLUCOSE 141* 149* 103* 104*  BUN 14 20 20 23   CREATININE 1.09 1.32* 0.89 0.83  CALCIUM 9.3 9.0 8.1* 9.2    Studies: No results found.  Scheduled Meds: . benzonatate  100 mg Oral TID  . carvedilol  6.25 mg Oral BID  . digoxin  125 mcg Oral QPM  . famotidine  20 mg Oral Daily  . feeding supplement (KATE [employer]ARMS STANDARD 1.4)  325 mL Oral Daily  . lactulose  20 g Oral BID  . methocarbamol  500 mg Oral TID  . multivitamin with minerals  1 tablet Oral Daily  . polyethylene glycol  17 g Oral Daily  . rosuvastatin  10 mg Oral QPM  . senna-docusate  1 tablet Oral BID  . sodium chloride flush  3 mL Intravenous Q12H   Continuous Infusions: . chlorproMAZINE (THORAZINE) IV 12.5 mg (01/09/20 1526)  . methocarbamol (ROBAXIN) IV     PRN Meds: acetaminophen **OR** acetaminophen, alum & mag hydroxide-simeth, chlorproMAZINE (THORAZINE) IV, methocarbamol (ROBAXIN) IV, ondansetron **OR** ondansetron (ZOFRAN) IV, oxyCODONE, traZODone  Time spent: 35 minutes  Author: Berle Mull, MD [facility] 01/09/2020 7:27 PM  To reach On-call, see care teams to locate the attending and reach out via [email or web address]. Between 7PM-7AM, please contact night-coverage If you still have  difficulty reaching the attending provider, please page the [facility] (Director on Call) for [facility] on amion for assistance.

## 2020-01-09 NOTE — Evaluation (Signed)
Occupational Therapy Evaluation Patient Details Name: Andrew Vance MRN: [id number] DOB: 02-Mar-1936 Today's Date: 01/09/2020    History of Present Illness 84 year old gentleman with history of hypertension, chronic A. fib on Coumadin, chronic systolic heart failure, tachybradycardia syndrome status post ICD pacemaker in place presented to the ER with abdominal pain.  Patient reported having Covid infection about 3 weeks ago.  Pt admitted for Rectus sheath hematoma, symptomatic with pain and swelling due to coagulopathy with use of Coumadin and supratherapeutic INR,  Acute blood loss anemia   Clinical Impression   Andrew Vance is an 84 year old man who presents with the above medical history supine in bed on therapist entry. On evaluation patient demonstrates his baseline upper extremity strength and ROM (decreased finger ROM secondary to arthritis and fused joints), ability to perform bed mobility with use of trapeze bar and ability to perform most of his ADLs. Patient required assistance to don socks - though he did attempt to don in his typical fashion which is to stand on one leg and put foot up on high surface. Patient required min-mod assist to stabilize in this position. Patient reports he does have a sock aid at home. Patient reports ambulating to bathroom without assistance from nursing and able to stand and perform grooming task with therapist across the room observing. Patient appears to be near his baseline and able to compensate as needed in order to perform functional tasks. No OT needs at this time.    Follow Up Recommendations  No OT follow up    Equipment Recommendations  None recommended by OT    Recommendations for Other Services       Precautions / Restrictions Precautions Precaution Comments: Rectus sheath hematoma Restrictions Weight Bearing Restrictions: No      Mobility Bed Mobility Overal bed mobility: Modified Independent             General bed  mobility comments: Patient using trapeze bar for bed mobility.  Transfers Overall transfer level: Needs assistance Equipment used: None Transfers: Sit to/from Stand Sit to Stand: Supervision         General transfer comment: Patient ambulated in room with supervision from therapist for safety. No loss of balance or unsafe movement.    Balance Overall balance assessment: Mild deficits observed, not formally tested                                         ADL either performed or assessed with clinical judgement   ADL Overall ADL's : Needs assistance/impaired Eating/Feeding: Independent   Grooming: Oral care;Standing;Wash/dry face;Wash/dry hands Grooming Details (indicate cue type and reason): Performed grooming standing at the sink. Upper Body Bathing: Independent   Lower Body Bathing: Independent   Upper Body Dressing : Independent   Lower Body Dressing: Minimal assistance Lower Body Dressing Details (indicate cue type and reason): Needed assistance to donn socks - due to typically standing on one foot and donning sock with other foot braced on sink. Patient able to physically perform task but losing balance. Toilet Transfer: Programmer, applications Details (indicate cue type and reason): Reports ambulating to bathroom without assistance. Toileting- Clothing Manipulation and Hygiene: Independent Toileting - Clothing Manipulation Details (indicate cue type and reason): Reports ambulating to bathroom without assistance.     Functional mobility during ADLs: Supervision/safety       Vision Patient Visual Report: No change  from baseline Vision Assessment?: No apparent visual deficits     Perception     Praxis      Pertinent Vitals/Pain Pain Assessment: Faces Pain Location: abdomen with coughing Pain Descriptors / Indicators: Sore Pain Intervention(s): Monitored during session     Hand Dominance     Extremity/Trunk Assessment Upper Extremity  Assessment Upper Extremity Assessment: Overall WFL for tasks assessed   Lower Extremity Assessment Lower Extremity Assessment: Defer to PT evaluation   Cervical / Trunk Assessment Cervical / Trunk Assessment: Normal   Communication Communication Communication: No difficulties   Cognition Arousal/Alertness: Awake/alert Behavior During Therapy: WFL for tasks assessed/performed Overall Cognitive Status: Within Functional Limits for tasks assessed                                     General Comments       Exercises     Shoulder Instructions      Home Living Family/patient expects to be discharged to:: Private residence Living Arrangements: Spouse/significant other   Type of Home: House Home Access: Stairs to enter Technical brewer of Steps: 2 Entrance Stairs-Rails: None Home Layout: One level               Home Equipment: Environmental consultant - 2 wheels          Prior Functioning/Environment Level of Independence: Independent                 OT Problem List:        OT Treatment/Interventions:      OT Goals(Current goals can be found in the care plan section) Acute Rehab OT Goals Patient Stated Goal: To go home OT Goal Formulation: All assessment and education complete, DC therapy  OT Frequency:     Barriers to D/C:            Co-evaluation              AM-PAC OT "6 Clicks" Daily Activity     Outcome Measure Help from another person eating meals?: None Help from another person taking care of personal grooming?: None Help from another person toileting, which includes using toliet, bedpan, or urinal?: None Help from another person bathing (including washing, rinsing, drying)?: None Help from another person to put on and taking off regular upper body clothing?: None Help from another person to put on and taking off regular lower body clothing?: A Little 6 Click Score: 23   End of Session Nurse Communication:  (okay to see per  RN)  Activity Tolerance: Patient tolerated treatment well Patient left: in bed;with call bell/phone within reach                   Time: 1013-1020 OT Time Calculation (min): 7 min Charges:  OT General Charges $OT Visit: 1 Visit OT Evaluation $OT Eval Low Complexity: 1 Low  .vcl   Phoenyx Melka L Nene Aranas 01/09/2020, 12:24 PM

## 2020-01-10 ENCOUNTER — Inpatient Hospital Stay (HOSPITAL_COMMUNITY): Payer: PPO

## 2020-01-10 LAB — BASIC METABOLIC PANEL
Anion gap: 11 (ref 5–15)
BUN: 20 mg/dL (ref 8–23)
CO2: 26 mmol/L (ref 22–32)
Calcium: 9.4 mg/dL (ref 8.9–10.3)
Chloride: 93 mmol/L — ABNORMAL LOW (ref 98–111)
Creatinine, Ser: 0.77 mg/dL (ref 0.61–1.24)
GFR calc Af Amer: >60 mL/min (ref >60)
GFR calc non Af Amer: >60 mL/min (ref >60)
Glucose, Bld: 110 mg/dL — ABNORMAL HIGH (ref 70–99)
Potassium: 4.4 mmol/L (ref 3.5–5.1)
Sodium: 130 mmol/L — ABNORMAL LOW (ref 135–145)

## 2020-01-10 LAB — CBC
HCT: 28.3 % — ABNORMAL LOW (ref 39.0–52.0)
Hemoglobin: 9.4 g/dL — ABNORMAL LOW (ref 13.0–17.0)
MCH: 32.3 pg (ref 26.0–34.0)
MCHC: 33.2 g/dL (ref 30.0–36.0)
MCV: 97.3 fL (ref 80.0–100.0)
Platelets: 178 10*3/uL (ref 150–400)
RBC: 2.91 MIL/uL — ABNORMAL LOW (ref 4.22–5.81)
RDW: 13.9 % (ref 11.5–15.5)
WBC: 10 10*3/uL (ref 4.0–10.5)
nRBC: 0 % (ref 0.0–0.2)

## 2020-01-10 LAB — PROTIME-INR
INR: 1.1 (ref 0.8–1.2)
Prothrombin Time: 14 seconds (ref 11.4–15.2)

## 2020-01-10 MED ORDER — SIMETHICONE 80 MG PO CHEW
80.0000 mg | CHEWABLE_TABLET | Freq: Four times a day (QID) | ORAL | Status: DC
  Administered 2020-01-10 – 2020-01-12 (×7): 80 mg via ORAL
  Filled 2020-01-10 (×7): qty 1

## 2020-01-10 MED ORDER — METHOCARBAMOL 500 MG PO TABS: 500.0000 mg | ORAL_TABLET | Freq: Three times a day (TID) | ORAL | Status: DC | PRN

## 2020-01-10 NOTE — Progress Notes (Signed)
[facility] Progress Note  Patient: Andrew Vance    BOF:[id number]  DOA: 01/06/2020     Date of Service: the patient was seen and examined on 01/10/2020  Brief hospital course: Past medical history of hypertension, chronic A. fib on Coumadin, chronic systolic CHF, tachybradycardia syndrome SP ICD implant.  Presents with abdominal pain found to have rectus hematoma with acute blood loss anemia. Currently plan is continue close monitoring.  Assessment and Plan: Rectus sheath hematoma, symptomatic with pain and swelling due to coagulopathy with use of Coumadin and supratherapeutic INR.  Acute blood loss anemia Vitamin K 10 mg and 4 units FFP given.   INR was 4.7.  Improved to 1.4 Patient continues to have significant pain.  Worsens with movement.   Hemoglobin has dropped some.   transfuse if it drops below 8.  Recent COVID-19 infection Patient symptoms started about 3 weeks ago.  They have significantly improved.  He has completed 3 weeks from his initial positive test.  He is afebrile.  He can be taken off of isolation.    Chronic A. Fib Coumadin will be discontinued until clinical stabilization and reevaluation.  Continue digoxin.  Rate controlled.  On Coreg and digoxin. Patient does have ICD pacemaker and is stable.  No mechanical valve.  Chronic systolic heart failure: Remains stable.  No evidence for fluid overload.  No recent echocardiogram noted in the system.  The one from 2016 shows a EF of 25 to 30%.  Patient noted to be on carvedilol and digoxin.  Not noted to be on ACE inhibitor or ARB.  Will defer this to outpatient providers.  Hiccups. As needed Thorazine.  Acid reflux. Pepcid.  Constipation. Bowel regimen initiated. X-ray shows dilated bowels without any obstruction. We will add simethicone.  Mild hyponatremia. Likely from poor p.o. intake due to severe constipation. Monitor.  Acute encephalopathy. Etiology not clear pretension patient occasionally  confused.  Likely delirium in the setting of hospital stay. Continue to monitor for now.  Moderate protein calorie malnutrition. Nutrition Problem: Moderate Malnutrition Etiology: acute illness (COVID-19 infection) Interventions: Interventions: MVI, Magic cup, Other (Comment) [employer] 1.4 po)  Diet: Cardiac diet DVT Prophylaxis:   SCDs Start: 01/06/20 2248    Advance goals of care discussion: DNR  Family Communication: family was present at bedside, at the time of interview.  The pt provided permission to discuss medical plan with the family. Opportunity was given to ask question and all questions were answered satisfactorily.   Disposition:  Status is: Inpatient  Remains inpatient appropriate because:Inpatient level of care appropriate due to severity of illness   Dispo:  Patient From: Home  Planned Disposition: Home  Expected discharge date: 01/11/20  Medically stable for discharge: No  Subjective: No nausea no vomiting.  Appears comfortable confused.  No fever no chills.  Passing gas.  No BM.  Physical Exam:  General: Appear in mild distress, no Rash; Oral Mucosa Clear, moist. no Abnormal Neck Mass Or lumps, Conjunctiva normal  Cardiovascular: S1 and S2 Present, no Murmur, Respiratory: good respiratory effort, Bilateral Air entry present and CTA, no Crackles, no wheezes Abdomen: Bowel Sound present, Soft and no tenderness Extremities: no Pedal edema Neurology: alert and oriented to time, place, and person affect appropriate. no new focal deficit Gait not checked dueto patient safety concerns  Vitals:   01/09/20 2126 01/10/20 0520 01/10/20 1301 01/10/20 2039  BP: 100/74 110/74 105/65 105/62  Pulse: 77 72 70 69  Resp: 16 20 16 20   Temp:  97.7 F (36.5 C) 97.8 F (36.6 C) (!) 97.4 F (36.3 C) 98.4 F (36.9 C)  TempSrc:   Oral Oral  SpO2: 98% 94% 94% 98%  Weight:      Height:        Intake/Output Summary (Last 24 hours) at 01/10/2020 2102 Last data filed  at 01/10/2020 1300 Gross per 24 hour  Intake 360 ml  Output 1 ml  Net 359 ml   Filed Weights   01/06/20 1556 01/08/20 0844  Weight: 74.8 kg 78 kg    Data Reviewed: I have personally reviewed and interpreted daily labs, tele strips, imagings as discussed above. I reviewed all nursing notes, pharmacy notes, vitals, pertinent old records I have discussed plan of care as described above with RN and patient/family.  CBC: Recent Labs  Lab 01/07/20 1712 01/08/20 0432 01/08/20 1440 01/09/20 0546 01/10/20 0611  WBC 11.4* 9.7 12.6* 9.5 10.0  HGB 9.5* 8.3* 9.5* 8.9* 9.4*  HCT 28.9* 25.9* 28.6* 26.8* 28.3*  MCV 99.3 99.2 99.3 97.1 97.3  PLT 157 146* 172 155 259   Basic Metabolic Panel: Recent Labs  Lab 01/06/20 1643 01/07/20 0420 01/08/20 0432 01/09/20 0546 01/10/20 0611  NA 137 136 135 131* 130*  K 4.5 4.4 3.8 4.3 4.4  CL 102 102 100 92* 93*  CO2 22 24 25 26 26   GLUCOSE 141* 149* 103* 104* 110*  BUN 14 20 20 23 20   CREATININE 1.09 1.32* 0.89 0.83 0.77  CALCIUM 9.3 9.0 8.1* 9.2 9.4    Studies: DG Abd Portable 1V  Result Date: 01/10/2020 CLINICAL DATA:  Constipation.  Abdominal pain. EXAM: X-RAY ABDOMEN 1 VIEW COMPARISON:  CT dated 01/06/2020 FINDINGS: There is nonspecific gaseous distention of loops of small bowel and colon scattered throughout the abdomen. The bowel gas pattern is otherwise nonobstructive. There is a moderate amount of stool in the ascending colon. There are degenerative changes of the lumbar spine. No pneumatosis or free air. The patient is status post bilateral total hip arthroplasty. IMPRESSION: Nonspecific gaseous distention of loops of small bowel and colon scattered throughout the abdomen. Electronically Signed   By: [name] M.D.   On: 01/10/2020 16:03    Scheduled Meds: . benzonatate  100 mg Oral TID  . carvedilol  6.25 mg Oral BID  . digoxin  125 mcg Oral QPM  . famotidine  20 mg Oral Daily  . feeding supplement ([employer] STANDARD  1.4)  325 mL Oral Daily  . multivitamin with minerals  1 tablet Oral Daily  . polyethylene glycol  17 g Oral Daily  . rosuvastatin  10 mg Oral QPM  . senna-docusate  1 tablet Oral BID  . simethicone  80 mg Oral QID  . sodium chloride flush  3 mL Intravenous Q12H   Continuous Infusions: . chlorproMAZINE (THORAZINE) IV 12.5 mg (01/10/20 1217)   PRN Meds: acetaminophen **OR** acetaminophen, alum & mag hydroxide-simeth, chlorproMAZINE (THORAZINE) IV, methocarbamol, ondansetron **OR** ondansetron (ZOFRAN) IV, oxyCODONE, traZODone  Time spent: 35 minutes  Author: Berle Mull, MD [facility] 01/10/2020 9:02 PM  To reach On-call, see care teams to locate the attending and reach out via [email or web address]. Between 7PM-7AM, please contact night-coverage If you still have difficulty reaching the attending provider, please page the [facility] (Director on Call) for [facility] on amion for assistance.

## 2020-01-10 NOTE — Progress Notes (Signed)
Patient refusing intervention of bed alarm d/t going off frequently w/ movement, RN educated pt on bed alarm protocol for safety and fall prevention as well as offered to decrease sensitivity. Pt verbalized understanding and still wishes to have bed alarm turned off. Pt is alert and oriented at this time and alarm turned off. RN will continue to monitor.

## 2020-01-11 ENCOUNTER — Other Ambulatory Visit: Payer: Self-pay | Admitting: Internal Medicine

## 2020-01-11 LAB — PROTIME-INR
INR: 1.2 (ref 0.8–1.2)
Prothrombin Time: 14.9 seconds (ref 11.4–15.2)

## 2020-01-11 LAB — BASIC METABOLIC PANEL
Anion gap: 10 (ref 5–15)
BUN: 15 mg/dL (ref 8–23)
CO2: 26 mmol/L (ref 22–32)
Calcium: 8.9 mg/dL (ref 8.9–10.3)
Chloride: 96 mmol/L — ABNORMAL LOW (ref 98–111)
Creatinine, Ser: 0.76 mg/dL (ref 0.61–1.24)
GFR calc Af Amer: >60 mL/min (ref >60)
GFR calc non Af Amer: >60 mL/min (ref >60)
Glucose, Bld: 100 mg/dL — ABNORMAL HIGH (ref 70–99)
Potassium: 4 mmol/L (ref 3.5–5.1)
Sodium: 132 mmol/L — ABNORMAL LOW (ref 135–145)

## 2020-01-11 LAB — CBC
HCT: 25.8 % — ABNORMAL LOW (ref 39.0–52.0)
Hemoglobin: 8.5 g/dL — ABNORMAL LOW (ref 13.0–17.0)
MCH: 32.6 pg (ref 26.0–34.0)
MCHC: 32.9 g/dL (ref 30.0–36.0)
MCV: 98.9 fL (ref 80.0–100.0)
Platelets: 163 10*3/uL (ref 150–400)
RBC: 2.61 MIL/uL — ABNORMAL LOW (ref 4.22–5.81)
RDW: 14 % (ref 11.5–15.5)
WBC: 6.4 10*3/uL (ref 4.0–10.5)
nRBC: 0 % (ref 0.0–0.2)

## 2020-01-11 MED ORDER — MAGNESIUM CITRATE PO SOLN
1.0000 | Freq: Once | ORAL | Status: AC
  Administered 2020-01-11: 1 via ORAL
  Filled 2020-01-11: qty 296

## 2020-01-11 MED ORDER — POLYVINYL ALCOHOL 1.4 % OP SOLN
1.0000 [drp] | OPHTHALMIC | Status: DC | PRN
  Administered 2020-01-11: 1 [drp] via OPHTHALMIC
  Filled 2020-01-11: qty 15

## 2020-01-11 NOTE — Care Management Important Message (Signed)
Important Message  Patient Details IM Letter given to the Patient Name: Andrew Vance MRN: [id number] Date of Birth: [birth year]   Medicare Important Message Given:  Yes     Kerin Salen 01/11/2020, 11:51 AM

## 2020-01-11 NOTE — Progress Notes (Signed)
RN held nighttime laxative/stool softener d/t pt having multiple episodes of watery diarrhea. At time of bedside shift report @ 1900, pt reported being up to the bathroom several times with watery diarrhea, but "no bowel movement yet". He had received mag citrate today d/t reporting to staff of having no BM and being constipated, but RN educated that is considered a bowel movement even though stool is loose. RN instructed pt to notify staff of future BMs for accurate output charting. Will continue to monitor.

## 2020-01-11 NOTE — Progress Notes (Signed)
[facility] Progress Note  Patient: Andrew Vance    ALP:[id number]  DOA: 01/06/2020     Date of Service: the patient was seen and examined on 01/11/2020  Brief hospital course: Past medical history of hypertension, chronic A. fib on Coumadin, chronic systolic CHF, tachybradycardia syndrome SP ICD implant.  Presents with abdominal pain found to have rectus hematoma with acute blood loss anemia. Currently plan is continue to treat constipation  Assessment and Plan: Rectus sheath hematoma, symptomatic with pain and swelling due to coagulopathy with use of Coumadin and supratherapeutic INR.  Acute blood loss anemia Vitamin K 10 mg and 4 units FFP given.   INR was 4.7.  Improved to 1.4 Patient continues to have significant pain.  Worsens with movement.   Hemoglobin has dropped some.   transfuse if it drops below 8. As needed Robaxin  Recent COVID-19 infection Patient symptoms started about 3 weeks ago.  They have significantly improved.  He has completed 3 weeks from his initial positive test.  He is afebrile.  He can be taken off of isolation.    Chronic A. Fib Coumadin will be discontinued until clinical stabilization and reevaluation.  Continue digoxin.  Rate controlled.  On Coreg and digoxin. Patient does have ICD pacemaker and is stable.  No mechanical valve.  Chronic systolic heart failure: Remains stable.  No evidence for fluid overload.  No recent echocardiogram noted in the system.  The one from 2016 shows a EF of 25 to 30%.  Patient noted to be on carvedilol and digoxin.  Not noted to be on ACE inhibitor or ARB.  Will defer this to outpatient providers.  Hiccups. As needed Thorazine.  Acid reflux. Pepcid.  Constipation. Bowel regimen initiated. X-ray shows dilated bowels without any obstruction. We will add simethicone. Distention significantly improved today.  Mild hyponatremia. Likely from poor p.o. intake due to severe constipation. Monitor.  Acute  encephalopathy. Etiology not clear pretension patient occasionally confused.  Likely delirium in the setting of hospital stay. Continue to monitor for now.  Moderate protein calorie malnutrition. Nutrition Problem: Moderate Malnutrition Etiology: acute illness (COVID-19 infection) Interventions: Interventions: MVI, Magic cup, Other (Comment) [employer] 1.4 po)  Diet: Cardiac diet DVT Prophylaxis:   SCDs Start: 01/06/20 2248    Advance goals of care discussion: DNR  Family Communication: family was present at bedside, at the time of interview.  The pt provided permission to discuss medical plan with the family. Opportunity was given to ask question and all questions were answered satisfactorily.   Disposition:  Status is: Inpatient  Remains inpatient appropriate because:Inpatient level of care appropriate due to severity of illness   Dispo:  Patient From: Home  Planned Disposition: Home  Expected discharge date: 01/11/20  Medically stable for discharge: No  Subjective: More awake.  Less confused.  No fever no chills.  Back to baseline.  No BM.  Physical Exam:  General: Appear in mild distress, no Rash; Oral Mucosa Clear, moist. no Abnormal Neck Mass Or lumps, Conjunctiva normal  Cardiovascular: S1 and S2 Present, no Murmur, Respiratory: good respiratory effort, Bilateral Air entry present and CTA, no Crackles, no wheezes Abdomen: Bowel Sound present, Soft and no tenderness Extremities: no Pedal edema Neurology: alert and oriented to time, place, and person affect appropriate. no new focal deficit Gait not checked dueto patient safety concerns  Vitals:   01/10/20 2039 01/11/20 0451 01/11/20 1349 01/11/20 2020  BP: 105/62 110/66 114/74 124/72  Pulse: 69 69 76 74  Resp:  20 20  20   Temp: 98.4 F (36.9 C) 97.7 F (36.5 C) 98.6 F (37 C) 99.7 F (37.6 C)  TempSrc: Oral Oral Oral Oral  SpO2: 98% 97% 96% 100%  Weight:      Height:       No intake or output data in  the 24 hours ending 01/11/20 2038 Filed Weights   01/06/20 1556 01/08/20 0844  Weight: 74.8 kg 78 kg    Data Reviewed: I have personally reviewed and interpreted daily labs, tele strips, imagings as discussed above. I reviewed all nursing notes, pharmacy notes, vitals, pertinent old records I have discussed plan of care as described above with RN and patient/family.  CBC: Recent Labs  Lab 01/08/20 0432 01/08/20 1440 01/09/20 0546 01/10/20 0611 01/11/20 0503  WBC 9.7 12.6* 9.5 10.0 6.4  HGB 8.3* 9.5* 8.9* 9.4* 8.5*  HCT 25.9* 28.6* 26.8* 28.3* 25.8*  MCV 99.2 99.3 97.1 97.3 98.9  PLT 146* 172 155 178 951   Basic Metabolic Panel: Recent Labs  Lab 01/07/20 0420 01/08/20 0432 01/09/20 0546 01/10/20 0611 01/11/20 0503  NA 136 135 131* 130* 132*  K 4.4 3.8 4.3 4.4 4.0  CL 102 100 92* 93* 96*  CO2 24 25 26 26 26   GLUCOSE 149* 103* 104* 110* 100*  BUN 20 20 23 20 15   CREATININE 1.32* 0.89 0.83 0.77 0.76  CALCIUM 9.0 8.1* 9.2 9.4 8.9    Studies: No results found.  Scheduled Meds: . benzonatate  100 mg Oral TID  . carvedilol  6.25 mg Oral BID  . digoxin  125 mcg Oral QPM  . famotidine  20 mg Oral Daily  . feeding supplement ([employer] STANDARD 1.4)  325 mL Oral Daily  . multivitamin with minerals  1 tablet Oral Daily  . polyethylene glycol  17 g Oral Daily  . rosuvastatin  10 mg Oral QPM  . senna-docusate  1 tablet Oral BID  . simethicone  80 mg Oral QID  . sodium chloride flush  3 mL Intravenous Q12H   Continuous Infusions: . chlorproMAZINE (THORAZINE) IV 12.5 mg (01/10/20 1217)   PRN Meds: acetaminophen **OR** acetaminophen, alum & mag hydroxide-simeth, chlorproMAZINE (THORAZINE) IV, methocarbamol, ondansetron **OR** ondansetron (ZOFRAN) IV, oxyCODONE, polyvinyl alcohol, traZODone  Time spent: 35 minutes  Author: Berle Mull, MD [facility] 01/11/2020 8:38 PM  To reach On-call, see care teams to locate the attending and reach out via  [email or web address]. Between 7PM-7AM, please contact night-coverage If you still have difficulty reaching the attending provider, please page the [facility] (Director on Call) for [facility] on amion for assistance.

## 2020-01-11 NOTE — TOC Progression Note (Signed)
Transition of Care [facility]) - Progression Note    Patient Details  Name: Andrew Vance MRN: [id number] Date of Birth: 11-Feb-1936  Transition of Care [facility]) CM/SW Contact  Taylah Dubiel, Juliann Pulse, RN Phone Number: 01/11/2020, 12:36 PM  Clinical Narrative: Referral for HHPT-patient confused currently;Lef vm w/spouse Belenda Cruise listed [phone]-await call back to discuss d/c plan.           Expected Discharge Plan and Services                                                 Social Determinants of Health (SDOH) Interventions    Readmission Risk Interventions No flowsheet data found.

## 2020-01-12 LAB — BASIC METABOLIC PANEL
Anion gap: 11 (ref 5–15)
BUN: 14 mg/dL (ref 8–23)
CO2: 25 mmol/L (ref 22–32)
Calcium: 8.5 mg/dL — ABNORMAL LOW (ref 8.9–10.3)
Chloride: 94 mmol/L — ABNORMAL LOW (ref 98–111)
Creatinine, Ser: 0.8 mg/dL (ref 0.61–1.24)
GFR calc Af Amer: >60 mL/min (ref >60)
GFR calc non Af Amer: >60 mL/min (ref >60)
Glucose, Bld: 111 mg/dL — ABNORMAL HIGH (ref 70–99)
Potassium: 3.8 mmol/L (ref 3.5–5.1)
Sodium: 130 mmol/L — ABNORMAL LOW (ref 135–145)

## 2020-01-12 LAB — CBC
HCT: 26 % — ABNORMAL LOW (ref 39.0–52.0)
Hemoglobin: 8.6 g/dL — ABNORMAL LOW (ref 13.0–17.0)
MCH: 32.6 pg (ref 26.0–34.0)
MCHC: 33.1 g/dL (ref 30.0–36.0)
MCV: 98.5 fL (ref 80.0–100.0)
Platelets: 183 10*3/uL (ref 150–400)
RBC: 2.64 MIL/uL — ABNORMAL LOW (ref 4.22–5.81)
RDW: 14.2 % (ref 11.5–15.5)
WBC: 7.8 10*3/uL (ref 4.0–10.5)
nRBC: 0 % (ref 0.0–0.2)

## 2020-01-12 LAB — PROTIME-INR
INR: 1.2 (ref 0.8–1.2)
Prothrombin Time: 14.7 seconds (ref 11.4–15.2)

## 2020-01-12 MED ORDER — SIMETHICONE 80 MG PO CHEW: 80.0000 mg | CHEWABLE_TABLET | Freq: Four times a day (QID) | ORAL | 0 refills | Status: DC | PRN

## 2020-01-12 MED ORDER — WARFARIN SODIUM 5 MG PO TABS: ORAL_TABLET | ORAL | 1 refills | Status: DC

## 2020-01-12 MED ORDER — POLYETHYLENE GLYCOL 3350 17 G PO PACK: 17.0000 g | PACK | Freq: Every day | ORAL | 0 refills | Status: DC | PRN

## 2020-01-12 MED ORDER — ADULT MULTIVITAMIN W/MINERALS CH: 1.0000 | ORAL_TABLET | Freq: Every day | ORAL | 0 refills | Status: DC

## 2020-01-12 MED ORDER — BENZONATATE 100 MG PO CAPS: 100.0000 mg | ORAL_CAPSULE | Freq: Three times a day (TID) | ORAL | 0 refills | Status: DC

## 2020-01-12 NOTE — Discharge Summary (Signed)
[facility] Discharge Summary   Patient: Andrew Vance MOL:[id number]  PCP: Laurey Morale, MD  Date of admission: 01/06/2020   Date of discharge: 01/12/2020      Discharge Diagnoses:  Principal diagnosis Rectus sheath hematoma   Active Problems:   HYPERTENSION, BENIGN   PSORIATIC ARTHRITIS   Atrial fibrillation ([place])   Cardiomyopathy ([place])   Pacemaker-DDD  [place]   Tachy-brady syndrome ([place])   Chronic systolic heart failure (HCC)   Rectus sheath hematoma   Malnutrition of moderate degree  Admitted From: home Disposition:  Home   Recommendations for Outpatient Follow-up:  1. PCP: please follow up with PCP in 1 week 2. Follow up LABS/TEST:  CBC, decide on resuming coumadin   Follow-up Information    Laurey Morale, MD. Schedule an appointment as soon as possible for a visit in 1 week(s).   Specialty: Family Medicine Why: repeat INR, CBC in 1 week, need to resume warfain.  Contact information: [place] [address] [phone]        Care, [facility] Follow up.   Specialty: Home Health Services Why: [place] [facility] Contact information: [address] [place] [address] [phone]              Diet recommendation: Cardiac diet  Activity: The patient is advised to gradually reintroduce usual activities, as tolerated  Discharge Condition: stable  Code Status: DNR   History of present illness: As per the H and P dictated on admission, " Andrew Vance is a 84 y.o. male with hx of hypertension, A. 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  Patient presented to PCP earlier today with complaint of abdominal pain as well as abdominal bruising.  At that visit abdominal bruises were noted and patient was transported to the ED for further evaluation.  Here patient reports he had a Covid infection around 3 weeks ago. Up until  about a week ago he had significant coughing, and reports that his diet also changed significantly with a decreased intake overall as well as specifically leafy greens. Then about 10 days ago he began to have left lower quadrant abdominal pain that got progressively worse. This morning pain spread across all lower quadrant of his abdomen and he started to notice a bulge to the left of his umbilicus which he thought may be a hernia. Due to this he presented to his PCP. He denies any blood in his urine or stool, denies any excessive bleeding from his gums. He has been taking his Coumadin as prescribed without any missed doses and reports that his INR is usually very very stable.  In the ED vital signs were unremarkable.  Lab work-up was notable for unremarkable CMP, CBC with WBC of 15.4 and hemoglobin of 13.9 overall unremarkable, INR 4.7, and unremarkable UA.  He underwent a CT abdomen and pelvis which showed a large left rectus sheath hematoma with evidence of active extravasation.  ED provider discussed case with radiology, they did not recommend any active IR intervention at this time, rather observation and reversal of anticoagulation.  Patient was given 10 mg of vitamin K as well as 2 units of FFP.  He was admitted for further management of his rectus sheath hematoma."  Hospital Course:  Summary of his active problems in the hospital is as following.  Rectus sheath hematoma, symptomatic with pain and swelling due to coagulopathy with use  of Coumadin and supratherapeutic INR.  Acute blood loss anemia Vitamin K 10 mg and4units FFP given.  INR was 4.7.Improved to normal Pain controlled  Hemoglobin stable.  As needed Robaxin  Recent COVID-19 infection Patient symptoms started about 3 weeks ago.  They have significantly improved.  He has completed 3 weeks from his initial positive test.  He is afebrile. No need for isolation.  Chronic A. Fib Coumadin will be discontinued until  clinical stabilization and outpatient  reevaluation. Continue digoxin. Rate controlled. On Coreg and digoxin. Patient does have ICD pacemaker and is stable. No mechanical valve.  Chronic systolic heart failure: Remains stable. No evidence for fluid overload. No recent echocardiogram noted in the system. The one from 2016 shows a EF of 25 to 30%. Patient noted to be on carvedilol and digoxin. Not noted to be on ACE inhibitor or ARB. Will defer this to outpatient providers.  Hiccups. As needed Thorazine.  Acid reflux. Pepcid.  Constipation. Bowel regimen initiated. Resolved   Mild hyponatremia. Likely from poor p.o. intake due to severe constipation. Monitor.  Acute encephalopathy. Due to Thorazine and delirium in the setting of hospital stay. Resolved   Moderate protein calorie malnutrition. Body mass index is 26.15 kg/m.  Nutrition Problem: Moderate Malnutrition Etiology: acute illness (COVID-19 infection) Nutrition Interventions: Interventions: MVI, Magic cup, Other (Comment) [employer] 1.4 po)  Patient was seen by physical therapy, who recommended Home health, which was arranged. On the day of the discharge the patient's vitals were stable, and no other acute medical condition were reported by patient. the patient was felt safe to be discharge at Home with Home health.  Consultants: none Procedures: none  Discharge Exam: General: Appear in no distress, skin bruising on lower abdomen Rash; Oral Mucosa Clear, moist. Cardiovascular: S1 and S2 Present, no Murmur, Respiratory: normal respiratory effort, Bilateral Air entry present and no Crackles, no wheezes Abdomen: Bowel Sound present, Soft and mild tenderness, no hernia Extremities: no Pedal edema, no calf tenderness Neurology: alert and oriented to time, place, and person affect appropriate.  Filed Weights   01/06/20 1556 01/08/20 0844  Weight: 74.8 kg 78 kg   Vitals:   01/11/20 2020 01/12/20  0546  BP: 124/72 107/66  Pulse: 74 75  Resp: 20 20  Temp: 99.7 F (37.6 C) 98.4 F (36.9 C)  SpO2: 100% 98%    DISCHARGE MEDICATION: Allergies as of 01/12/2020      Reactions   Zithromax [azithromycin] Other (See Comments)   Affects Coumadin levels       Medication List    STOP taking these medications   amoxicillin 500 MG tablet Commonly known as: AMOXIL     TAKE these medications   acetaminophen 325 MG tablet Commonly known as: TYLENOL Take 650 mg by mouth 2 (two) times daily as needed (pain).   benzonatate 100 MG capsule Commonly known as: TESSALON Take 1 capsule (100 mg total) by mouth 3 (three) times daily.   carvedilol 6.25 MG tablet Commonly known as: COREG Take 1.5 tablets (9.375 mg total) by mouth 2 (two) times daily.   fluocinonide 0.05 % external solution Commonly known as: LIDEX Apply 1 application topically 2 (two) times daily.   hydrocortisone cream 1 % Apply 1 application topically daily as needed for itching. Reported on 06/20/2015   Lutein 20 MG Caps Take 20 mg by mouth at bedtime.   multivitamin with minerals Tabs tablet Take 1 tablet by mouth daily. Start taking on: January 13, 2020   polyethylene  glycol 17 g packet Commonly known as: MIRALAX / GLYCOLAX Take 17 g by mouth daily as needed for mild constipation.   rosuvastatin 10 MG tablet Commonly known as: CRESTOR Take 1 tablet (10 mg total) by mouth daily. What changed: when to take this   simethicone 80 MG chewable tablet Commonly known as: MYLICON Chew 1 tablet (80 mg total) by mouth 4 (four) times daily as needed for flatulence.   warfarin 5 MG tablet Commonly known as: COUMADIN Take as directed. If you are unsure how to take this medication, talk to your nurse or doctor. Original instructions: HOLD UNTIL SEEN BY PCP OR CARDIOLOGY What changed: additional instructions     ASK your doctor about these medications   digoxin 0.125 MG tablet Commonly known as: LANOXIN TAKE 1  TABLET BY MOUTH DAILY Ask about: Which instructions should I use?      Allergies  Allergen Reactions  . Zithromax [Azithromycin] Other (See Comments)    Affects Coumadin levels    Discharge Instructions    Diet - low sodium heart healthy   Complete by: As directed    Increase activity slowly   Complete by: As directed       The results of significant diagnostics from this hospitalization (including imaging, microbiology, ancillary and laboratory) are listed below for reference.    Significant Diagnostic Studies: CT Abdomen Pelvis W Contrast  Result Date: 01/06/2020 CLINICAL DATA:  Abdominal distension. EXAM: CT ABDOMEN AND PELVIS WITH CONTRAST TECHNIQUE: Multidetector CT imaging of the abdomen and pelvis was performed using the standard protocol following bolus administration of intravenous contrast. CONTRAST:  171mL OMNIPAQUE IOHEXOL 300 MG/ML  SOLN COMPARISON:  None. FINDINGS: Lower chest: There is atelectasis at the lung bases, left greater than right.The heart is enlarged. Hepatobiliary: The liver is normal. Normal gallbladder.There is no biliary ductal dilation. Pancreas: Normal contours without ductal dilatation. No peripancreatic fluid collection. Spleen: Unremarkable. Adrenals/Urinary Tract: --Adrenal glands: Unremarkable. --Right kidney/ureter: There is a nonobstructing stone in the interpolar region of the right kidney measuring approximately 4 mm. There is a cyst in the interpolar region with a thin peripheral calcification. --Left kidney/ureter: No hydronephrosis or radiopaque kidney stones. --Urinary bladder: The urinary bladder is poorly evaluated secondary to extensive streak artifact through the patient's pelvis. Stomach/Bowel: --Stomach/Duodenum: There is a small hiatal hernia. --Small bowel: Unremarkable. --Colon: There are colonic diverticula, greatest at the level of the sigmoid colon, without CT evidence for diverticulitis. --Appendix: Not visualized. No right lower  quadrant inflammation or free fluid. Vascular/Lymphatic: Atherosclerotic calcification is present within the non-aneurysmal abdominal aorta, without hemodynamically significant stenosis. --No retroperitoneal lymphadenopathy. --No mesenteric lymphadenopathy. --No pelvic or inguinal lymphadenopathy. Reproductive: Unremarkable Other: No ascites or free air. There is a large left rectus sheath hematoma measuring approximately 7.1 by 5.9 by 21 cm. This hematoma has ruptured into the preperitoneal space and pelvis. There is evidence for areas of active extravasation within the hematoma. Musculoskeletal. The patient is status post prior bilateral total hip arthroplasty. There is no acute displaced fracture. IMPRESSION: 1. Large left rectus sheath hematoma with evidence for active extravasation within the hematoma. 2. Colonic diverticulosis without CT evidence for diverticulitis. 3. There is a nonobstructing right-sided nephroliths as detailed above. 4. Cardiomegaly. Aortic Atherosclerosis (ICD10-I70.0). These results were called by telephone at the time of interpretation on 01/06/2020 at 8:22 pm to provider Lacretia Leigh , who verbally acknowledged these results. Electronically Signed   By: [name] M.D.   On: 01/06/2020 20:26   DG Abd  Portable 1V  Result Date: 01/10/2020 CLINICAL DATA:  Constipation.  Abdominal pain. EXAM: X-RAY ABDOMEN 1 VIEW COMPARISON:  CT dated 01/06/2020 FINDINGS: There is nonspecific gaseous distention of loops of small bowel and colon scattered throughout the abdomen. The bowel gas pattern is otherwise nonobstructive. There is a moderate amount of stool in the ascending colon. There are degenerative changes of the lumbar spine. No pneumatosis or free air. The patient is status post bilateral total hip arthroplasty. IMPRESSION: Nonspecific gaseous distention of loops of small bowel and colon scattered throughout the abdomen. Electronically Signed   By: [name] M.D.   On:  01/10/2020 16:03    Microbiology: Recent Results (from the past 240 hour(s))  SARS Coronavirus 2 by RT PCR (hospital order, performed in [facility] lab) Nasopharyngeal Nasopharyngeal Swab     Status: Abnormal   Collection Time: 01/06/20  9:05 PM   Specimen: Nasopharyngeal Swab  Result Value Ref Range Status   SARS Coronavirus 2 POSITIVE (A) NEGATIVE Final    Comment: RESULT CALLED TO, READ BACK BY AND VERIFIED WITH: RN S MULDOON AT 2248 01/06/20 [place] A (NOTE) SARS-CoV-2 target nucleic acids are DETECTED  SARS-CoV-2 RNA is generally detectable in upper respiratory specimens  during the acute phase of infection.  Positive results are indicative  of the presence of the identified virus, but do not rule out bacterial infection or co-infection with other pathogens not detected by the test.  Clinical correlation with patient history and  other diagnostic information is necessary to determine patient infection status.  The expected result is negative.  Fact Sheet for Patients:   StrictlyIdeas.no   Fact Sheet for Healthcare Providers:   BankingDealers.co.za    This test is not yet approved or cleared by the [place] FDA and  has been authorized for detection and/or diagnosis of SARS-CoV-2 by FDA under an Emergency Use Authorization (EUA).  This EUA will remain in effect (meaning  this test can be used) for the duration of  the COVID-19 declaration under Section 564(b)(1) of the Act, 21 U.S.C. section 360-bbb-3(b)(1), unless the authorization is terminated or revoked sooner.  Performed at [facility], [place] [address] [place], [place] [address]      Labs: CBC: Recent Labs  Lab 01/08/20 1440 01/09/20 0546 01/10/20 0611 01/11/20 0503 01/12/20 0450  WBC 12.6* 9.5 10.0 6.4 7.8  HGB 9.5* 8.9* 9.4* 8.5* 8.6*  HCT 28.6* 26.8* 28.3* 25.8* 26.0*  MCV 99.3 97.1 97.3 98.9 98.5  PLT 172 155 178 163  428   Basic Metabolic Panel: Recent Labs  Lab 01/08/20 0432 01/09/20 0546 01/10/20 0611 01/11/20 0503 01/12/20 0450  NA 135 131* 130* 132* 130*  K 3.8 4.3 4.4 4.0 3.8  CL 100 92* 93* 96* 94*  CO2 25 26 26 26 25   GLUCOSE 103* 104* 110* 100* 111*  BUN 20 23 20 15 14   CREATININE 0.89 0.83 0.77 0.76 0.80  CALCIUM 8.1* 9.2 9.4 8.9 8.5*   Liver Function Tests: Recent Labs  Lab 01/06/20 1643  AST 22  ALT 16  ALKPHOS 64  BILITOT 1.3*  PROT 7.2  ALBUMIN 3.8   Recent Labs  Lab 01/06/20 1643  LIPASE 31   No results for input(s): AMMONIA in the last 168 hours. Cardiac Enzymes: No results for input(s): CKTOTAL, CKMB, CKMBINDEX, TROPONINI in the last 168 hours. BNP (last 3 results) No results for input(s): BNP in the last 8760 hours. CBG: No results for input(s): GLUCAP in the  last 168 hours.  Time spent: 35 minutes  Signed:  Berle Mull  [facility] 01/12/2020 9:40 PM

## 2020-01-12 NOTE — TOC Transition Note (Signed)
Transition of Care [facility]) - CM/SW Discharge Note   Patient Details  Name: Andrew Vance MRN: [id number] Date of Birth: 04-12-1936  [facility]) CM/SW Contact:  Dessa Phi, RN Phone Number: 01/12/2020, 10:22 AM   Clinical Narrative:  D/c home w/HHC-[facility] rep Tommi Rumps aware-HHPT(await HHPT order, & face to face-MD notified) No further CM needs.     Final next level of care: [place] Barriers to Discharge: No Barriers Identified   Patient Goals and CMS Choice Patient states their goals for this hospitalization and ongoing recovery are:: go home CMS Medicare.gov Compare Post Acute Care list provided to:: Patient Represenative (must comment) Choice offered to / list presented to : Patient  Discharge Placement                       Discharge Plan and Services     Post Acute Care Choice: Home Health                    HH Arranged: PT [place]: [place] Date [place]: 01/12/20 Time [place]: 4801 Representative spoke with at [place]: [place] ([place]) Interventions     Readmission Risk Interventions No flowsheet data found.

## 2020-01-12 NOTE — Discharge Instructions (Signed)
Hematoma A hematoma is a collection of blood under the skin, in an organ, in a body space, in a joint space, or in other tissue. The blood can thicken (clot) to form a lump that you can see and feel. The lump is often firm and may become sore and tender. Most hematomas get better in a few days to weeks. However, some hematomas may be serious and require medical care. Hematomas can range from very small to very large. What are the causes? This condition is caused by:  A blunt or penetrating injury.  A leakage from a blood vessel under the skin.  Some medical procedures, including surgeries, such as oral surgery, face lifts, and surgeries on the joints.  Some medical conditions that cause bleeding or bruising. There may be multiple hematomas that appear in different areas of the body. What increases the risk? You are more likely to develop this condition if:  You are an older adult.  You use blood thinners. What are the signs or symptoms?  Symptoms of this condition depend on where the hematoma is located.  Common symptoms of a hematoma that is under the skin include:  A firm lump on the body.  Pain and tenderness in the area.  Bruising. Blue, dark blue, purple-red, or yellowish skin (discoloration) may appear at the site of the hematoma if the hematoma is close to the surface of the skin. Common symptoms of a hematoma that is deep in the tissues or body spaces may be less obvious. They include:  A collection of blood in the stomach (intra-abdominal hematoma). This may cause pain in the abdomen, weakness, fainting, and shortness of breath.  A collection of blood in the head (intracranial hematoma). This may cause a headache or symptoms such as weakness, trouble speaking or understanding, or a change in consciousness. How is this diagnosed? This condition is diagnosed based on:  Your medical history.  A physical exam.  Imaging tests, such as an ultrasound or CT scan. These may  be needed if your health care provider suspects a hematoma in deeper tissues or body spaces.  Blood tests. These may be needed if your health care provider believes that the hematoma is caused by a medical condition. How is this treated? Treatment for this condition depends on the cause, size, and location of the hematoma. Treatment may include:  Doing nothing. The majority of hematomas do not need treatment as many of them go away on their own over time.  Surgery or close monitoring. This may be needed for large hematomas or hematomas that affect vital organs.  Medicines. Medicines may be given if there is an underlying medical cause for the hematoma. Follow these instructions at home: Managing pain, stiffness, and swelling   If directed, put ice on the affected area. ? Put ice in a plastic bag. ? Place a towel between your skin and the bag. ? Leave the ice on for 20 minutes, 2-3 times a day for the first couple of days.  If directed, apply heat to the affected area after applying ice for a couple of days. Use the heat source that your health care provider recommends, such as a moist heat pack or a heating pad. ? Place a towel between your skin and the heat source. ? Leave the heat on for 20-30 minutes. ? Remove the heat if your skin turns bright red. This is especially important if you are unable to feel pain, heat, or cold. You may have a greater   risk of getting burned.  Raise (elevate) the affected area above the level of your heart while you are sitting or lying down.  If told, wrap the affected area with an elastic bandage. The bandage applies pressure (compression) to the area, which may help to reduce swelling and promote healing. Do not wrap the bandage too tightly around the affected area.  If your hematoma is on a leg or foot (lower extremity) and is painful, your health care provider may recommend crutches. Use them as told by your health care provider. General  instructions  Take over-the-counter and prescription medicines only as told by your health care provider.  Keep all follow-up visits as told by your health care provider. This is important. Contact a health care provider if:  You have a fever.  The swelling or discoloration gets worse.  You develop more hematomas. Get help right away if:  Your pain is worse or your pain is not controlled with medicine.  Your skin over the hematoma breaks or starts bleeding.  Your hematoma is in your chest or abdomen and you have weakness, shortness of breath, or a change in consciousness.  You have a hematoma on your scalp that is caused by a fall or injury, and you also have: ? A headache that gets worse. ? Trouble speaking or understanding speech. ? Weakness. ? Change in alertness or consciousness. Summary  A hematoma is a collection of blood under the skin, in an organ, in a body space, in a joint space, or in other tissue.  This condition usually does not need treatment because many hematomas go away on their own over time.  Large hematomas, or those that may affect vital organs, may need surgical drainage or monitoring. If the hematoma is caused by a medical condition, medicines may be prescribed.  Get help right away if your hematoma breaks or starts to bleed, you have shortness of breath, or you have a headache or trouble speaking after a fall. This information is not intended to replace advice given to you by your health care provider. Make sure you discuss any questions you have with your health care provider. Document Revised: 09/10/2018 Document Reviewed: 09/19/2017 Elsevier Patient Education  2020 [employer] Inc.  

## 2020-01-13 ENCOUNTER — Telehealth: Payer: Self-pay | Admitting: Family Medicine

## 2020-01-13 ENCOUNTER — Telehealth: Payer: Self-pay | Admitting: *Deleted

## 2020-01-13 NOTE — Telephone Encounter (Signed)
Pt called and stated he has been in the hospital and discharged off Warfarin due to internal bleeding and positive COVID diagnosis; per d/c summary pt to see PCP or Cards to decide on when to restart as CBC needs to be followed. He has an appt with PCP on 01/27/20 and he will call back with updated info. Canceled Anticoagulation Appt.

## 2020-01-13 NOTE — Telephone Encounter (Signed)
Transition Care Management Follow-up Telephone Call  Date of discharge and from where: 01/12/2020 from [facility]   How have you been since you were released from the hospital? Patient states has had extreme pain since being discharged  from hospital   Any questions or concerns? No  Items Reviewed:  Did the pt receive and understand the discharge instructions provided? Yes   Medications obtained and verified? Yes   Any new allergies since your discharge? No   Dietary orders reviewed? Yes  Do you have support at home? Yes lives with wife  Functional Questionnaire: (I = Independent and D = Dependent) ADLs: I  Bathing/Dressing- I  Meal Prep- I  Eating- I  Maintaining continence- I  Transferring/Ambulation- I  Managing Meds- I  Follow up appointments reviewed:   PCP Hospital f/u appt confirmed? Yes  Scheduled to see Dr. Sarajane Jews  on 01/19/2020 @ 11:30 am.  [facility] f/u appt confirmed? No    Are transportation arrangements needed? No   If their condition worsens, is the pt aware to call PCP or go to the Emergency Dept.? Yes  Was the patient provided with contact information for the PCP's office or ED? Yes  Was to pt encouraged to call back with questions or concerns? Yes

## 2020-01-14 ENCOUNTER — Other Ambulatory Visit: Payer: Self-pay

## 2020-01-15 ENCOUNTER — Other Ambulatory Visit: Payer: Self-pay | Admitting: Internal Medicine

## 2020-01-15 ENCOUNTER — Encounter: Payer: Self-pay | Admitting: Family Medicine

## 2020-01-15 ENCOUNTER — Ambulatory Visit (INDEPENDENT_AMBULATORY_CARE_PROVIDER_SITE_OTHER): Payer: PPO | Admitting: Family Medicine

## 2020-01-15 VITALS — BP 118/60 | HR 77 | Temp 97.8°F | Ht 68.0 in | Wt 168.0 lb

## 2020-01-15 DIAGNOSIS — R066 Hiccough: Secondary | ICD-10-CM | POA: Diagnosis not present

## 2020-01-15 DIAGNOSIS — I4891 Unspecified atrial fibrillation: Secondary | ICD-10-CM

## 2020-01-15 DIAGNOSIS — S301XXA Contusion of abdominal wall, initial encounter: Secondary | ICD-10-CM | POA: Diagnosis not present

## 2020-01-15 DIAGNOSIS — I10 Essential (primary) hypertension: Secondary | ICD-10-CM

## 2020-01-15 DIAGNOSIS — Z23 Encounter for immunization: Secondary | ICD-10-CM

## 2020-01-15 MED ORDER — METOCLOPRAMIDE HCL 10 MG PO TABS: 10.0000 mg | ORAL_TABLET | Freq: Four times a day (QID) | ORAL | 0 refills | Status: DC

## 2020-01-15 MED ORDER — WARFARIN SODIUM 4 MG PO TABS: 4.0000 mg | ORAL_TABLET | Freq: Every day | ORAL | 0 refills | Status: DC

## 2020-01-15 NOTE — Addendum Note (Signed)
Addended by: Matilde Sprang on: 01/15/2020 02:50 PM   Modules accepted: Orders

## 2020-01-15 NOTE — Progress Notes (Signed)
   Subjective:    Patient ID: Andrew Vance, male    DOB: [birth year], 84 y.o.   MRN: [id number]  HPI Here to follow up a hospital stay from 01-06-20- to 01-12-20 for  Rectus muscle sheath hematoma. He has been on Coumadin for years and he was getting his INR checked avery 6 weeks. However he tested positive for the Covid-19 virus last month, and the virus apparently affected his clotting factors. His admission INR was 4.7, and this had led to swelling and pain in his abdomen. Of note he has also had intractable hiccups for several weeks. He was found to have a large hematoma in the sheath of a rectus muscle, and I think the trauma of repeated hiccups led to its formation. He was treated successfully andhis final INR was 1.2. he was given some PRBC and FFP, and his Hgb at DC was 8.6. heis still off Coumadin at this time. He feels better, the pain has decreased, and he is able to eat again.    Review of Systems  Constitutional: Negative.   Respiratory: Negative.   Cardiovascular: Negative.   Gastrointestinal: Positive for abdominal distention and abdominal pain. Negative for anal bleeding, blood in stool, constipation, diarrhea, nausea, rectal pain and vomiting.       Objective:   Physical Exam Constitutional:      Appearance: Normal appearance. He is not ill-appearing.     Comments: He hiccups occasionally   Cardiovascular:     Rate and Rhythm: Normal rate and regular rhythm.     Pulses: Normal pulses.     Heart sounds: Normal heart sounds.  Pulmonary:     Effort: Pulmonary effort is normal.     Breath sounds: Normal breath sounds.  Abdominal:     General: Abdomen is flat. Bowel sounds are normal. There is no distension.     Palpations: There is no mass.     Tenderness: There is no guarding.     Hernia: No hernia is present.     Comments: There is still a large tender mass in the LLQ with extensive ecchymosis.   Neurological:     Mental Status: He is alert.            Assessment & Plan:  Follow up of a rectus sheath hematoma which was caused by over anticoagulation related to a Covid infection. This will resolve over time. We will check a Hgb and an INR today. He will start back on Coumadin today at 4 mg daily (he was on 5mg  previously). He will get rechecked at the [facility] next week. We will try to stop the hiccups with Reglan.  Alysia Penna, MD

## 2020-01-16 LAB — CBC WITH DIFFERENTIAL/PLATELET
Absolute Monocytes: 963 cells/uL — ABNORMAL HIGH (ref 200–950)
Basophils Absolute: 18 cells/uL (ref 0–200)
Basophils Relative: 0.2 %
Eosinophils Absolute: 171 cells/uL (ref 15–500)
Eosinophils Relative: 1.9 %
HCT: 29.6 % — ABNORMAL LOW (ref 38.5–50.0)
Hemoglobin: 9.7 g/dL — ABNORMAL LOW (ref 13.2–17.1)
Lymphs Abs: 1053 cells/uL (ref 850–3900)
MCH: 31.4 pg (ref 27.0–33.0)
MCHC: 32.8 g/dL (ref 32.0–36.0)
MCV: 95.8 fL (ref 80.0–100.0)
MPV: 10 fL (ref 7.5–12.5)
Monocytes Relative: 10.7 %
Neutro Abs: 6795 cells/uL (ref 1500–7800)
Neutrophils Relative %: 75.5 %
Platelets: 304 10*3/uL (ref 140–400)
RBC: 3.09 10*6/uL — ABNORMAL LOW (ref 4.20–5.80)
RDW: 13.5 % (ref 11.0–15.0)
Total Lymphocyte: 11.7 %
WBC: 9 10*3/uL (ref 3.8–10.8)

## 2020-01-16 LAB — PROTIME-INR
INR: 1
Prothrombin Time: 11.1 s (ref 9.0–11.5)

## 2020-01-22 ENCOUNTER — Other Ambulatory Visit: Payer: Self-pay

## 2020-01-22 ENCOUNTER — Ambulatory Visit (INDEPENDENT_AMBULATORY_CARE_PROVIDER_SITE_OTHER): Payer: PPO | Admitting: Pharmacist

## 2020-01-22 DIAGNOSIS — Z5181 Encounter for therapeutic drug level monitoring: Secondary | ICD-10-CM

## 2020-01-22 LAB — POCT INR: INR: 1.3 — AB (ref 2.0–3.0)

## 2020-01-22 MED ORDER — WARFARIN SODIUM 5 MG PO TABS: ORAL_TABLET | ORAL | 3 refills | Status: DC

## 2020-01-22 NOTE — Patient Instructions (Signed)
Description   Stop taking your 4mg  tablets and restart your 5mg  tablets. Take an extra 1/2 tablet of the 5mg  dose today, then resume taking 1 tablet (5mg ) once a day. Recheck INR next Thursday.  [phone] [id number]

## 2020-01-26 ENCOUNTER — Other Ambulatory Visit: Payer: Self-pay | Admitting: Internal Medicine

## 2020-01-26 ENCOUNTER — Inpatient Hospital Stay: Payer: PPO | Admitting: Family Medicine

## 2020-01-26 MED ORDER — CARVEDILOL 6.25 MG PO TABS
9.3750 mg | ORAL_TABLET | Freq: Two times a day (BID) | ORAL | 0 refills | Status: DC
Start: 2020-01-26 — End: 2020-07-06

## 2020-01-28 ENCOUNTER — Other Ambulatory Visit: Payer: Self-pay

## 2020-01-28 ENCOUNTER — Ambulatory Visit (INDEPENDENT_AMBULATORY_CARE_PROVIDER_SITE_OTHER): Payer: PPO | Admitting: *Deleted

## 2020-01-28 DIAGNOSIS — Z5181 Encounter for therapeutic drug level monitoring: Secondary | ICD-10-CM

## 2020-01-28 DIAGNOSIS — I4891 Unspecified atrial fibrillation: Secondary | ICD-10-CM

## 2020-01-28 LAB — POCT INR: INR: 1.7 — AB (ref 2.0–3.0)

## 2020-01-28 NOTE — Patient Instructions (Signed)
Description   Today take take 1.5 tablets then start taking 1 tablet (5mg ) except 1.5 tablets on Mondays. Recheck INR in 1 week. [id number]

## 2020-02-04 ENCOUNTER — Other Ambulatory Visit: Payer: Self-pay

## 2020-02-04 ENCOUNTER — Ambulatory Visit (INDEPENDENT_AMBULATORY_CARE_PROVIDER_SITE_OTHER): Payer: PPO | Admitting: *Deleted

## 2020-02-04 DIAGNOSIS — I4891 Unspecified atrial fibrillation: Secondary | ICD-10-CM | POA: Diagnosis not present

## 2020-02-04 DIAGNOSIS — Z5181 Encounter for therapeutic drug level monitoring: Secondary | ICD-10-CM | POA: Diagnosis not present

## 2020-02-04 LAB — POCT INR: INR: 2.9 (ref 2.0–3.0)

## 2020-02-04 NOTE — Patient Instructions (Signed)
Description   Continue taking Warfarin 1 tablet (5mg ) except 1.5 tablets on Mondays. Recheck INR in 3 weeks. [phone] [id number]

## 2020-02-24 ENCOUNTER — Other Ambulatory Visit: Payer: Self-pay

## 2020-02-24 ENCOUNTER — Ambulatory Visit (INDEPENDENT_AMBULATORY_CARE_PROVIDER_SITE_OTHER): Payer: PPO | Admitting: *Deleted

## 2020-02-24 DIAGNOSIS — I4891 Unspecified atrial fibrillation: Secondary | ICD-10-CM | POA: Diagnosis not present

## 2020-02-24 DIAGNOSIS — Z5181 Encounter for therapeutic drug level monitoring: Secondary | ICD-10-CM

## 2020-02-24 LAB — POCT INR: INR: 3.4 — AB (ref 2.0–3.0)

## 2020-02-24 NOTE — Patient Instructions (Addendum)
Description   Do not take any Warfarin tomorrow then continue taking Warfarin 1 tablet (5mg ) except 1.5 tablets on Mondays. Recheck INR in 2 weeks with MD appt. [phone] [id number]

## 2020-02-25 DIAGNOSIS — H5203 Hypermetropia, bilateral: Secondary | ICD-10-CM | POA: Diagnosis not present

## 2020-02-25 DIAGNOSIS — H401132 Primary open-angle glaucoma, bilateral, moderate stage: Secondary | ICD-10-CM | POA: Diagnosis not present

## 2020-03-07 ENCOUNTER — Ambulatory Visit (INDEPENDENT_AMBULATORY_CARE_PROVIDER_SITE_OTHER): Payer: PPO

## 2020-03-07 DIAGNOSIS — I4891 Unspecified atrial fibrillation: Secondary | ICD-10-CM

## 2020-03-08 ENCOUNTER — Other Ambulatory Visit: Payer: Self-pay

## 2020-03-08 ENCOUNTER — Ambulatory Visit: Payer: PPO | Admitting: Internal Medicine

## 2020-03-08 ENCOUNTER — Ambulatory Visit (INDEPENDENT_AMBULATORY_CARE_PROVIDER_SITE_OTHER): Payer: PPO

## 2020-03-08 ENCOUNTER — Encounter: Payer: Self-pay | Admitting: Internal Medicine

## 2020-03-08 VITALS — BP 122/84 | HR 90 | Ht 68.0 in | Wt 169.0 lb

## 2020-03-08 DIAGNOSIS — Z95810 Presence of automatic (implantable) cardiac defibrillator: Secondary | ICD-10-CM

## 2020-03-08 DIAGNOSIS — I4891 Unspecified atrial fibrillation: Secondary | ICD-10-CM | POA: Diagnosis not present

## 2020-03-08 DIAGNOSIS — I5022 Chronic systolic (congestive) heart failure: Secondary | ICD-10-CM

## 2020-03-08 DIAGNOSIS — Z5181 Encounter for therapeutic drug level monitoring: Secondary | ICD-10-CM | POA: Diagnosis not present

## 2020-03-08 LAB — POCT INR: INR: 3.3 — AB (ref 2.0–3.0)

## 2020-03-08 NOTE — Progress Notes (Signed)
HPI [name] returns today for followup. He is a pleasant 84yo man with a h/o atrial fib, symptomatic bradycardia and LBBB. He is s/p biV ICD andpaces 60% of the time. He has class 2 CHF symptoms. His EF was 15-20% initially then 25-30% by repeat echo. He has been on a beta blocker and ACE inhibitor and with initiation of lasix his dyspnea has resolvedand his peripheral edema improved.  He denies chest pain.He is back to work. He notes that despite being vaccinated, he got Covid and was hospitalized after bleeding in the abdominal cavity after coughing from Covid.  Allergies  Allergen Reactions  . Zithromax [Azithromycin] Other (See Comments)    Affects Coumadin levels      Current Outpatient Medications  Medication Sig Dispense Refill  . acetaminophen (TYLENOL) 325 MG tablet Take 650 mg by mouth 2 (two) times daily as needed (pain).    . benzonatate (TESSALON) 100 MG capsule Take 1 capsule (100 mg total) by mouth 3 (three) times daily. 20 capsule 0  . carvedilol (COREG) 6.25 MG tablet Take 1.5 tablets (9.375 mg total) by mouth 2 (two) times daily. Please keep upcoming appt in November with Dr. Lovena Le before anymore refills. Thank you 270 tablet 0  . digoxin (LANOXIN) 0.125 MG tablet TAKE 1 TABLET BY MOUTH DAILY 90 tablet 0  . fluocinonide (LIDEX) 0.05 % external solution Apply 1 application topically 2 (two) times daily.    . hydrocortisone cream 1 % Apply 1 application topically daily as needed for itching. Reported on 06/20/2015    . Lutein 20 MG CAPS Take 20 mg by mouth at bedtime.     . metoCLOPramide (REGLAN) 10 MG tablet Take 1 tablet (10 mg total) by mouth 4 (four) times daily. 40 tablet 0  . Multiple Vitamin (MULTIVITAMIN WITH MINERALS) TABS tablet Take 1 tablet by mouth daily. 30 tablet 0  . polyethylene glycol (MIRALAX / GLYCOLAX) 17 g packet Take 17 g by mouth daily as needed for mild constipation. 14 each 0  . rosuvastatin (CRESTOR) 10 MG tablet Take 1 tablet (10 mg  total) by mouth daily. (Patient taking differently: Take 10 mg by mouth every evening. ) 90 tablet 3  . simethicone (MYLICON) 80 MG chewable tablet Chew 1 tablet (80 mg total) by mouth 4 (four) times daily as needed for flatulence. 30 tablet 0  . warfarin (COUMADIN) 5 MG tablet Take 1 tablet daily or as directed by Coumadin clinic 30 tablet 3   No current facility-administered medications for this visit.     Past Medical History:  Diagnosis Date  . AICD (automatic cardioverter/defibrillator) present   . Anemia, iron deficiency    hx  . Arthritis    "all over"  . Atrial fibrillation [facility])    sees Dr. Virl Axe  . Bronchial pneumonia X 3 "when I was a kid"  . Cataract   . Complication of anesthesia 1976   BP elevated with spinal;  "I was still in the service when that happened"  . GERD (gastroesophageal reflux disease)   . GI bleed    hx  . Glaucoma   . H/O hiatal hernia   . Hyperlipidemia   . Hypertension   . Psoriatic arthritis [facility])    sees Dr. Hurley Cisco   . Tachycardia-bradycardia syndrome ([place])     ROS:   All systems reviewed and negative except as noted in the HPI.   Past Surgical History:  Procedure Laterality Date  .  APPENDECTOMY    . CARDIAC CATHETERIZATION  09/10/08  . CARDIAC DEFIBRILLATOR PLACEMENT  11/18/2014  . CATARACT EXTRACTION, BILATERAL  2020   at [facility]   . COLONOSCOPY  05-27-10   per Dr, Ardis Hughs, extensive diverticulosis, repeat in 10 yrs   . EP IMPLANTABLE DEVICE N/A 11/18/2014   Procedure: BiV ICD Upgrade;  Surgeon: Evans Lance, MD;  Location: [place] [facility];  Service: Cardiovascular;  Laterality: N/A;  . ESOPHAGOGASTRODUODENOSCOPY  05-27-10   per Dr. Ardis Hughs, normal   . GLAUCOMA SURGERY Bilateral 2020   at [facility]   . HAND SURGERY Left 1978   lft hand, fusions to DIPs and PIPs of fingers 1,2,4,and 5  . INSERT / REPLACE / REMOVE PACEMAKER  2010  . JOINT REPLACEMENT    . PILONIDAL CYST EXCISION  1961  . TOTAL HIP ARTHROPLASTY Bilateral  1998 `2001   bilateral, sees Dr. Para March   . TOTAL HIP REVISION  02/25/2012   Procedure: TOTAL HIP REVISION;  Surgeon: Kerin Salen, MD;  Location: [place];  Service: Orthopedics;  Laterality: Left;     Family History  Problem Relation Age of Onset  . Hypertension Other   . Heart disease Other      Social History   Socioeconomic History  . Marital status: Married    Spouse name: Not on file  . Number of children: Not on file  . Years of education: Not on file  . Highest education level: Not on file  Occupational History  . Not on file  Tobacco Use  . Smoking status: Former Smoker    Packs/day: 1.00    Years: 30.00    Pack years: 30.00    Types: Cigarettes, Pipe    Quit date: 02/17/1982    Years since quitting: 38.0  . Smokeless tobacco: Former Systems developer    Quit date: 05/01/1983  Vaping Use  . Vaping Use: Never used  Substance and Sexual Activity  . Alcohol use: Yes    Alcohol/week: 3.0 standard drinks    Types: 2 Shots of liquor, 1 Standard drinks or equivalent per week  . Drug use: No  . Sexual activity: Yes  Other Topics Concern  . Not on file  Social History Narrative  . Not on file   Social Determinants of Health   Financial Resource Strain:   . Difficulty of Paying Living Expenses: Not on file  Food Insecurity:   . Worried About Charity fundraiser in the Last Year: Not on file  . Ran Out of Food in the Last Year: Not on file  Transportation Needs:   . Lack of Transportation (Medical): Not on file  . Lack of Transportation (Non-Medical): Not on file  Physical Activity:   . Days of Exercise per Week: Not on file  . Minutes of Exercise per Session: Not on file  Stress:   . Feeling of Stress : Not on file  Social Connections:   . Frequency of Communication with Friends and Family: Not on file  . Frequency of Social Gatherings with Friends and Family: Not on file  . Attends Religious Services: Not on file  . Active Member of Clubs or Organizations: Not on  file  . Attends Archivist Meetings: Not on file  . Marital Status: Not on file  Intimate Partner Violence:   . Fear of Current or Ex-Partner: Not on file  . Emotionally Abused: Not on file  . Physically Abused: Not on file  . Sexually Abused:  Not on file     BP 122/84   Pulse 90   Ht 5\' 8"  (1.727 m)   Wt 169 lb (76.7 kg)   SpO2 97%   BMI 25.70 kg/m   Physical Exam:  Well appearing NAD HEENT: Unremarkable Neck:  No JVD, no thyromegally Lymphatics:  No adenopathy Back:  No CVA tenderness Lungs:  Clear with no wheezes HEART:  Regular rate rhythm, no murmurs, no rubs, no clicks Abd:  soft, positive bowel sounds, no organomegally, no rebound, no guarding Ext:  2 plus pulses, no edema, no cyanosis, no clubbing Skin:  No rashes no nodules Neuro:  CN II through XII intact, motor grossly intact  EKG - atrial fib with ventricular pacing  DEVICE  Normal device function.  See PaceArt for details.   Assess/Plan: 1. Atrial fib - his VR is well controlled. 2. ICD - his [employer] Biv ICD is working normally.  3. Chronic systolic heart failure - he has class 2 symptoms. He will continue guideline directed medical therapy. 4. Dyslipidemia - he will continue his statin therapy.  Carleene Overlie Minh Jasper,MD

## 2020-03-08 NOTE — Patient Instructions (Signed)
Description   Do not take any Warfarin tomorrow then start taking Warfarin 1 tablet (5mg ) daily. Recheck INR in 3 weeks. [phone] [id number]

## 2020-03-08 NOTE — Patient Instructions (Signed)
Medication Instructions:  Your physician recommends that you continue on your current medications as directed. Please refer to the Current Medication list given to you today.  Labwork: None ordered.  Testing/Procedures: None ordered.  Follow-Up: Your physician wants you to follow-up in: one year with Dr. Lovena Le.   You will receive a reminder letter in the mail two months in advance. If you don't receive a letter, please call our office to schedule the follow-up appointment.  Remote monitoring is used to monitor your ICD from home. This monitoring reduces the number of office visits required to check your device to one time per year. It allows [place] to keep an eye on the functioning of your device to ensure it is working properly. You are scheduled for a device check from home on 06/06/2020. You may send your transmission at any time that day. If you have a wireless device, the transmission will be sent automatically. After your physician reviews your transmission, you will receive a postcard with your next transmission date.  Any Other Special Instructions Will Be Listed Below (If Applicable).  If you need a refill on your cardiac medications before your next appointment, please call your pharmacy.

## 2020-03-09 LAB — CUP PACEART REMOTE DEVICE CHECK
Battery Remaining Longevity: 31 mo
Battery Remaining Percentage: 36 %
Battery Voltage: 2.92 V
Date Time Interrogation Session: 20211108154638
HighPow Impedance: 66 Ohm
HighPow Impedance: 66 Ohm
Implantable Lead Implant Date: 20100521
Implantable Lead Implant Date: 20160721
Implantable Lead Implant Date: 20160721
Implantable Lead Location: 753858
Implantable Lead Location: 753859
Implantable Lead Location: 753860
Implantable Lead Model: 7122
Implantable Pulse Generator Implant Date: 20160721
Lead Channel Impedance Value: 430 Ohm
Lead Channel Impedance Value: 690 Ohm
Lead Channel Pacing Threshold Amplitude: 1 V
Lead Channel Pacing Threshold Amplitude: 1.5 V
Lead Channel Pacing Threshold Pulse Width: 0.5 ms
Lead Channel Pacing Threshold Pulse Width: 0.5 ms
Lead Channel Sensing Intrinsic Amplitude: 12 mV
Lead Channel Setting Pacing Amplitude: 2.5 V
Lead Channel Setting Pacing Amplitude: 2.5 V
Lead Channel Setting Pacing Pulse Width: 0.5 ms
Lead Channel Setting Pacing Pulse Width: 0.5 ms
Lead Channel Setting Sensing Sensitivity: 0.5 mV
Pulse Gen Serial Number: 7289863

## 2020-03-09 NOTE — Addendum Note (Signed)
Addended by: Rose Phi on: 03/09/2020 12:01 PM   Modules accepted: Orders

## 2020-03-09 NOTE — Progress Notes (Signed)
Remote ICD transmission.   

## 2020-03-29 ENCOUNTER — Ambulatory Visit (INDEPENDENT_AMBULATORY_CARE_PROVIDER_SITE_OTHER): Payer: PPO

## 2020-03-29 ENCOUNTER — Other Ambulatory Visit: Payer: Self-pay

## 2020-03-29 DIAGNOSIS — Z5181 Encounter for therapeutic drug level monitoring: Secondary | ICD-10-CM | POA: Diagnosis not present

## 2020-03-29 DIAGNOSIS — I4891 Unspecified atrial fibrillation: Secondary | ICD-10-CM

## 2020-03-29 LAB — POCT INR: INR: 2.8 (ref 2.0–3.0)

## 2020-03-29 NOTE — Patient Instructions (Signed)
Description   Continue on same dosage Warfarin 1 tablet (5mg ) daily. Recheck INR in 4 weeks. [phone] [id number]

## 2020-04-04 ENCOUNTER — Other Ambulatory Visit: Payer: Self-pay | Admitting: Internal Medicine

## 2020-04-13 ENCOUNTER — Other Ambulatory Visit: Payer: Self-pay | Admitting: Internal Medicine

## 2020-04-25 ENCOUNTER — Telehealth: Payer: Self-pay | Admitting: Family Medicine

## 2020-04-25 NOTE — Telephone Encounter (Signed)
Left message for patient to call back and schedule Medicare Annual Wellness Visit (AWV) either virtually or in office.   Last AWV  No information  please schedule at anytime with [facility] Nurse Health Advisor 1 or 2   This should be a 45 minute visit.

## 2020-04-26 ENCOUNTER — Ambulatory Visit (INDEPENDENT_AMBULATORY_CARE_PROVIDER_SITE_OTHER): Payer: PPO | Admitting: Pharmacist

## 2020-04-26 ENCOUNTER — Other Ambulatory Visit: Payer: Self-pay

## 2020-04-26 DIAGNOSIS — I4891 Unspecified atrial fibrillation: Secondary | ICD-10-CM

## 2020-04-26 DIAGNOSIS — Z5181 Encounter for therapeutic drug level monitoring: Secondary | ICD-10-CM | POA: Diagnosis not present

## 2020-04-26 LAB — POCT INR: INR: 2.9 (ref 2.0–3.0)

## 2020-04-26 NOTE — Patient Instructions (Signed)
Continue on same dosage Warfarin 1 tablet (5mg ) daily. Recheck INR in 5 weeks. [phone] 

## 2020-05-31 ENCOUNTER — Ambulatory Visit (INDEPENDENT_AMBULATORY_CARE_PROVIDER_SITE_OTHER): Payer: PPO | Admitting: *Deleted

## 2020-05-31 ENCOUNTER — Other Ambulatory Visit: Payer: Self-pay

## 2020-05-31 DIAGNOSIS — Z5181 Encounter for therapeutic drug level monitoring: Secondary | ICD-10-CM

## 2020-05-31 LAB — POCT INR: INR: 2.5 (ref 2.0–3.0)

## 2020-05-31 NOTE — Patient Instructions (Signed)
Description   Continue on same dosage Warfarin 1 tablet (5mg ) daily. Recheck INR in 6 weeks. [phone] [id number]

## 2020-06-06 ENCOUNTER — Ambulatory Visit (INDEPENDENT_AMBULATORY_CARE_PROVIDER_SITE_OTHER): Payer: PPO

## 2020-06-06 DIAGNOSIS — I495 Sick sinus syndrome: Secondary | ICD-10-CM | POA: Diagnosis not present

## 2020-06-08 LAB — CUP PACEART REMOTE DEVICE CHECK
Battery Remaining Longevity: 28 mo
Battery Remaining Percentage: 32 %
Battery Voltage: 2.9 V
Date Time Interrogation Session: 20220207135803
HighPow Impedance: 63 Ohm
HighPow Impedance: 63 Ohm
Implantable Lead Implant Date: 20100521
Implantable Lead Implant Date: 20160721
Implantable Lead Implant Date: 20160721
Implantable Lead Location: 753858
Implantable Lead Location: 753859
Implantable Lead Location: 753860
Implantable Lead Model: 7122
Implantable Pulse Generator Implant Date: 20160721
Lead Channel Impedance Value: 440 Ohm
Lead Channel Impedance Value: 680 Ohm
Lead Channel Pacing Threshold Amplitude: 0.75 V
Lead Channel Pacing Threshold Amplitude: 2.5 V
Lead Channel Pacing Threshold Pulse Width: 0.4 ms
Lead Channel Pacing Threshold Pulse Width: 0.5 ms
Lead Channel Sensing Intrinsic Amplitude: 7.7 mV
Lead Channel Setting Pacing Amplitude: 2.5 V
Lead Channel Setting Pacing Amplitude: 2.5 V
Lead Channel Setting Pacing Pulse Width: 0.5 ms
Lead Channel Setting Pacing Pulse Width: 0.8 ms
Lead Channel Setting Sensing Sensitivity: 0.5 mV
Pulse Gen Serial Number: 7289863

## 2020-06-13 NOTE — Progress Notes (Signed)
Remote ICD transmission.   

## 2020-06-21 DIAGNOSIS — H401132 Primary open-angle glaucoma, bilateral, moderate stage: Secondary | ICD-10-CM | POA: Diagnosis not present

## 2020-07-06 ENCOUNTER — Other Ambulatory Visit: Payer: Self-pay | Admitting: Internal Medicine

## 2020-07-12 ENCOUNTER — Ambulatory Visit (INDEPENDENT_AMBULATORY_CARE_PROVIDER_SITE_OTHER): Payer: PPO | Admitting: *Deleted

## 2020-07-12 ENCOUNTER — Other Ambulatory Visit: Payer: Self-pay

## 2020-07-12 DIAGNOSIS — Z5181 Encounter for therapeutic drug level monitoring: Secondary | ICD-10-CM | POA: Diagnosis not present

## 2020-07-12 DIAGNOSIS — I4891 Unspecified atrial fibrillation: Secondary | ICD-10-CM | POA: Diagnosis not present

## 2020-07-12 LAB — POCT INR: INR: 2 (ref 2.0–3.0)

## 2020-07-12 NOTE — Patient Instructions (Signed)
Description   Today take 1.5 tablets then continue on same dosage Warfarin 1 tablet (5mg ) daily. Recheck INR in 6 weeks. [phone] [id number]

## 2020-08-04 DIAGNOSIS — Z85828 Personal history of other malignant neoplasm of skin: Secondary | ICD-10-CM | POA: Diagnosis not present

## 2020-08-04 DIAGNOSIS — L812 Freckles: Secondary | ICD-10-CM | POA: Diagnosis not present

## 2020-08-04 DIAGNOSIS — L570 Actinic keratosis: Secondary | ICD-10-CM | POA: Diagnosis not present

## 2020-08-04 DIAGNOSIS — L821 Other seborrheic keratosis: Secondary | ICD-10-CM | POA: Diagnosis not present

## 2020-08-04 DIAGNOSIS — L308 Other specified dermatitis: Secondary | ICD-10-CM | POA: Diagnosis not present

## 2020-08-04 DIAGNOSIS — D1801 Hemangioma of skin and subcutaneous tissue: Secondary | ICD-10-CM | POA: Diagnosis not present

## 2020-08-15 ENCOUNTER — Encounter: Payer: Self-pay | Admitting: Gastroenterology

## 2020-08-18 ENCOUNTER — Other Ambulatory Visit: Payer: Self-pay | Admitting: Family Medicine

## 2020-08-23 ENCOUNTER — Ambulatory Visit (INDEPENDENT_AMBULATORY_CARE_PROVIDER_SITE_OTHER): Payer: PPO

## 2020-08-23 ENCOUNTER — Other Ambulatory Visit: Payer: Self-pay

## 2020-08-23 DIAGNOSIS — Z5181 Encounter for therapeutic drug level monitoring: Secondary | ICD-10-CM | POA: Diagnosis not present

## 2020-08-23 DIAGNOSIS — I4891 Unspecified atrial fibrillation: Secondary | ICD-10-CM

## 2020-08-23 LAB — POCT INR: INR: 3.1 — AB (ref 2.0–3.0)

## 2020-08-23 NOTE — Patient Instructions (Signed)
Description   Take 1/2 tablet tomorrow, then resume same dosage Warfarin 1 tablet (5mg ) daily. Recheck INR in 6 weeks. [phone] [id number]

## 2020-09-05 ENCOUNTER — Ambulatory Visit (INDEPENDENT_AMBULATORY_CARE_PROVIDER_SITE_OTHER): Payer: PPO

## 2020-09-05 DIAGNOSIS — I495 Sick sinus syndrome: Secondary | ICD-10-CM | POA: Diagnosis not present

## 2020-09-06 ENCOUNTER — Encounter: Payer: Self-pay | Admitting: Family Medicine

## 2020-09-06 LAB — CUP PACEART REMOTE DEVICE CHECK
Battery Remaining Longevity: 25 mo
Battery Remaining Percentage: 30 %
Battery Voltage: 2.89 V
Date Time Interrogation Session: 20220509140351
HighPow Impedance: 60 Ohm
HighPow Impedance: 60 Ohm
Implantable Lead Implant Date: 20100521
Implantable Lead Implant Date: 20160721
Implantable Lead Implant Date: 20160721
Implantable Lead Location: 753858
Implantable Lead Location: 753859
Implantable Lead Location: 753860
Implantable Lead Model: 7122
Implantable Pulse Generator Implant Date: 20160721
Lead Channel Impedance Value: 410 Ohm
Lead Channel Impedance Value: 680 Ohm
Lead Channel Pacing Threshold Amplitude: 0.75 V
Lead Channel Pacing Threshold Amplitude: 2.5 V
Lead Channel Pacing Threshold Pulse Width: 0.4 ms
Lead Channel Pacing Threshold Pulse Width: 0.5 ms
Lead Channel Sensing Intrinsic Amplitude: 5.5 mV
Lead Channel Setting Pacing Amplitude: 2.5 V
Lead Channel Setting Pacing Amplitude: 2.5 V
Lead Channel Setting Pacing Pulse Width: 0.5 ms
Lead Channel Setting Pacing Pulse Width: 0.8 ms
Lead Channel Setting Sensing Sensitivity: 0.5 mV
Pulse Gen Serial Number: 7289863

## 2020-09-23 NOTE — Progress Notes (Signed)
Remote ICD transmission.   

## 2020-10-04 ENCOUNTER — Other Ambulatory Visit: Payer: Self-pay

## 2020-10-04 ENCOUNTER — Ambulatory Visit (INDEPENDENT_AMBULATORY_CARE_PROVIDER_SITE_OTHER): Payer: PPO | Admitting: *Deleted

## 2020-10-04 DIAGNOSIS — Z5181 Encounter for therapeutic drug level monitoring: Secondary | ICD-10-CM | POA: Diagnosis not present

## 2020-10-04 LAB — POCT INR: INR: 2.7 (ref 2.0–3.0)

## 2020-10-04 NOTE — Patient Instructions (Signed)
Description   Continue same dosage Warfarin 1 tablet (5mg ) daily. Recheck INR in 6 weeks. [phone] [id number]

## 2020-10-16 ENCOUNTER — Other Ambulatory Visit: Payer: Self-pay | Admitting: Internal Medicine

## 2020-10-25 DIAGNOSIS — H401132 Primary open-angle glaucoma, bilateral, moderate stage: Secondary | ICD-10-CM | POA: Diagnosis not present

## 2020-10-25 DIAGNOSIS — Z961 Presence of intraocular lens: Secondary | ICD-10-CM | POA: Diagnosis not present

## 2020-11-15 ENCOUNTER — Other Ambulatory Visit: Payer: Self-pay

## 2020-11-15 ENCOUNTER — Ambulatory Visit (INDEPENDENT_AMBULATORY_CARE_PROVIDER_SITE_OTHER): Payer: PPO | Admitting: *Deleted

## 2020-11-15 DIAGNOSIS — Z5181 Encounter for therapeutic drug level monitoring: Secondary | ICD-10-CM | POA: Diagnosis not present

## 2020-11-15 LAB — POCT INR: INR: 2.6 (ref 2.0–3.0)

## 2020-11-15 NOTE — Patient Instructions (Signed)
Description   Continue same dosage Warfarin 1 tablet (5mg ) daily. Recheck INR in 6 weeks. [phone] [id number]

## 2020-12-05 ENCOUNTER — Ambulatory Visit (INDEPENDENT_AMBULATORY_CARE_PROVIDER_SITE_OTHER): Payer: PPO

## 2020-12-05 DIAGNOSIS — I495 Sick sinus syndrome: Secondary | ICD-10-CM

## 2020-12-07 LAB — CUP PACEART REMOTE DEVICE CHECK
Battery Remaining Longevity: 23 mo
Battery Remaining Percentage: 27 %
Battery Voltage: 2.89 V
Date Time Interrogation Session: 20220808115541
HighPow Impedance: 61 Ohm
HighPow Impedance: 61 Ohm
Implantable Lead Implant Date: 20100521
Implantable Lead Implant Date: 20160721
Implantable Lead Implant Date: 20160721
Implantable Lead Location: 753858
Implantable Lead Location: 753859
Implantable Lead Location: 753860
Implantable Lead Model: 7122
Implantable Pulse Generator Implant Date: 20160721
Lead Channel Impedance Value: 460 Ohm
Lead Channel Impedance Value: 660 Ohm
Lead Channel Pacing Threshold Amplitude: 0.75 V
Lead Channel Pacing Threshold Amplitude: 2.5 V
Lead Channel Pacing Threshold Pulse Width: 0.4 ms
Lead Channel Pacing Threshold Pulse Width: 0.5 ms
Lead Channel Sensing Intrinsic Amplitude: 6.9 mV
Lead Channel Setting Pacing Amplitude: 2.5 V
Lead Channel Setting Pacing Amplitude: 2.5 V
Lead Channel Setting Pacing Pulse Width: 0.5 ms
Lead Channel Setting Pacing Pulse Width: 0.8 ms
Lead Channel Setting Sensing Sensitivity: 0.5 mV
Pulse Gen Serial Number: 7289863

## 2020-12-27 ENCOUNTER — Ambulatory Visit (INDEPENDENT_AMBULATORY_CARE_PROVIDER_SITE_OTHER): Payer: PPO | Admitting: *Deleted

## 2020-12-27 ENCOUNTER — Other Ambulatory Visit: Payer: Self-pay

## 2020-12-27 DIAGNOSIS — Z5181 Encounter for therapeutic drug level monitoring: Secondary | ICD-10-CM

## 2020-12-27 LAB — POCT INR: INR: 2.7 (ref 2.0–3.0)

## 2020-12-27 NOTE — Patient Instructions (Signed)
Description   Continue same dosage Warfarin 1 tablet ('5mg'$ ) daily. Recheck INR in 6 weeks. [phone] [id number]

## 2020-12-29 NOTE — Progress Notes (Signed)
Remote ICD transmission.   

## 2021-02-07 ENCOUNTER — Other Ambulatory Visit: Payer: Self-pay

## 2021-02-07 ENCOUNTER — Ambulatory Visit (INDEPENDENT_AMBULATORY_CARE_PROVIDER_SITE_OTHER): Payer: PPO | Admitting: *Deleted

## 2021-02-07 DIAGNOSIS — Z5181 Encounter for therapeutic drug level monitoring: Secondary | ICD-10-CM

## 2021-02-07 DIAGNOSIS — I4891 Unspecified atrial fibrillation: Secondary | ICD-10-CM | POA: Diagnosis not present

## 2021-02-07 LAB — POCT INR: INR: 2.4 (ref 2.0–3.0)

## 2021-02-07 NOTE — Patient Instructions (Signed)
Description   Continue taking Warfarin 1 tablet (5mg ) daily. Recheck INR in 6 weeks. [phone] [id number]

## 2021-02-23 DIAGNOSIS — H5203 Hypermetropia, bilateral: Secondary | ICD-10-CM | POA: Diagnosis not present

## 2021-02-23 DIAGNOSIS — H401132 Primary open-angle glaucoma, bilateral, moderate stage: Secondary | ICD-10-CM | POA: Diagnosis not present

## 2021-02-23 DIAGNOSIS — H353131 Nonexudative age-related macular degeneration, bilateral, early dry stage: Secondary | ICD-10-CM | POA: Diagnosis not present

## 2021-03-06 ENCOUNTER — Ambulatory Visit (INDEPENDENT_AMBULATORY_CARE_PROVIDER_SITE_OTHER): Payer: PPO

## 2021-03-06 DIAGNOSIS — I495 Sick sinus syndrome: Secondary | ICD-10-CM | POA: Diagnosis not present

## 2021-03-07 LAB — CUP PACEART REMOTE DEVICE CHECK
Battery Remaining Longevity: 24 mo
Battery Remaining Percentage: 27 %
Battery Voltage: 2.87 V
Date Time Interrogation Session: 20221107061144
HighPow Impedance: 66 Ohm
HighPow Impedance: 66 Ohm
Implantable Lead Implant Date: 20100521
Implantable Lead Implant Date: 20160721
Implantable Lead Implant Date: 20160721
Implantable Lead Location: 753858
Implantable Lead Location: 753859
Implantable Lead Location: 753860
Implantable Lead Model: 7122
Implantable Pulse Generator Implant Date: 20160721
Lead Channel Impedance Value: 580 Ohm
Lead Channel Impedance Value: 660 Ohm
Lead Channel Pacing Threshold Amplitude: 0.75 V
Lead Channel Pacing Threshold Amplitude: 2.5 V
Lead Channel Pacing Threshold Pulse Width: 0.4 ms
Lead Channel Pacing Threshold Pulse Width: 0.5 ms
Lead Channel Sensing Intrinsic Amplitude: 5.6 mV
Lead Channel Setting Pacing Amplitude: 2.5 V
Lead Channel Setting Pacing Amplitude: 2.5 V
Lead Channel Setting Pacing Pulse Width: 0.5 ms
Lead Channel Setting Pacing Pulse Width: 0.8 ms
Lead Channel Setting Sensing Sensitivity: 0.5 mV
Pulse Gen Serial Number: 7289863

## 2021-03-08 ENCOUNTER — Other Ambulatory Visit: Payer: Self-pay

## 2021-03-08 ENCOUNTER — Ambulatory Visit: Payer: PPO | Admitting: Internal Medicine

## 2021-03-08 VITALS — BP 126/72 | HR 71 | Ht 68.0 in | Wt 176.0 lb

## 2021-03-08 DIAGNOSIS — I5022 Chronic systolic (congestive) heart failure: Secondary | ICD-10-CM | POA: Diagnosis not present

## 2021-03-08 DIAGNOSIS — I4891 Unspecified atrial fibrillation: Secondary | ICD-10-CM | POA: Diagnosis not present

## 2021-03-08 DIAGNOSIS — Z95810 Presence of automatic (implantable) cardiac defibrillator: Secondary | ICD-10-CM

## 2021-03-08 NOTE — Patient Instructions (Addendum)
Medication Instructions:  Your physician recommends that you continue on your current medications as directed. Please refer to the Current Medication list given to you today.  Labwork: None ordered.  Testing/Procedures: None ordered.  Follow-Up: Your physician wants you to follow-up in: one year with one of the following Advanced Practice Providers on your designated Care Team:   Tommye Standard, [place] Legrand Como "Jonni Sanger" Chalmers Cater, [place]  Remote monitoring is used to monitor your ICD from home. This monitoring reduces the number of office visits required to check your device to one time per year. It allows [place] to keep an eye on the functioning of your device to ensure it is working properly. You are scheduled for a device check from home on 06/05/2021. You may send your transmission at any time that day. If you have a wireless device, the transmission will be sent automatically. After your physician reviews your transmission, you will receive a postcard with your next transmission date.  Any Other Special Instructions Will Be Listed Below (If Applicable).  If you need a refill on your cardiac medications before your next appointment, please call your pharmacy.

## 2021-03-08 NOTE — Progress Notes (Signed)
HPI [name] returns today for followup. He is a pleasant 85 yo man with a h/o atrial fib, symptomatic bradycardia and LBBB. He is s/p biV ICD and  paces 60% of the time. He has class 2 CHF symptoms. His EF was 15-20% initially then 25-30% by repeat echo. He has been on a beta blocker and ACE inhibitor and with initiation of lasix his dyspnea has resolved and his peripheral edema improved.  He denies chest pain. He has finally retired. 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  Allergies  Allergen Reactions   Zithromax [Azithromycin] Other (See Comments)    Affects Coumadin levels      Current Outpatient Medications  Medication Sig Dispense Refill   acetaminophen (TYLENOL) 325 MG tablet Take 650 mg by mouth 2 (two) times daily as needed (pain).     benzonatate (TESSALON) 100 MG capsule Take 1 capsule (100 mg total) by mouth 3 (three) times daily. 20 capsule 0   carvedilol (COREG) 6.25 MG tablet Take 1.5 tablets (9.375 mg total) by mouth 2 (two) times daily with a meal. 270 tablet 2   digoxin (LANOXIN) 0.125 MG tablet TAKE 1 TABLET BY MOUTH DAILY 90 tablet 3   fluocinonide (LIDEX) 0.05 % external solution Apply 1 application topically 2 (two) times daily.     hydrocortisone cream 1 % Apply 1 application topically daily as needed for itching. Reported on 06/20/2015     Lutein 20 MG CAPS Take 20 mg by mouth at bedtime.      metoCLOPramide (REGLAN) 10 MG tablet Take 1 tablet (10 mg total) by mouth 4 (four) times daily. 40 tablet 0   Multiple Vitamin (MULTIVITAMIN WITH MINERALS) TABS tablet Take 1 tablet by mouth daily. 30 tablet 0   polyethylene glycol (MIRALAX / GLYCOLAX) 17 g packet Take 17 g by mouth daily as needed for mild constipation. 14 each 0   rosuvastatin (CRESTOR) 10 MG tablet TAKE 1 TABLET BY MOUTH EVERY DAY 90 tablet 3   simethicone (MYLICON) 80 MG chewable tablet Chew 1 tablet (80 mg total) by mouth 4 (four) times daily as needed for flatulence. 30 tablet 0    triamcinolone cream (KENALOG) 0.1 % Apply 1 application topically 2 (two) times daily.     warfarin (COUMADIN) 5 MG tablet TAKE 1 TABLET BY MOUTH DAILY OR AS DIRECTED BY COUMADIN CLINIC 100 tablet 1   No current facility-administered medications for this visit.     Past Medical History:  Diagnosis Date   AICD (automatic cardioverter/defibrillator) present    Anemia, iron deficiency    hx   Arthritis    "all over"   Atrial fibrillation [facility])    sees Dr. Virl Vance   Bronchial pneumonia X 3 "when I was a kid"   Cataract    Complication of anesthesia 1976   BP elevated with spinal;  "I was still in the service when that happened"   GERD (gastroesophageal reflux disease)    GI bleed    hx   Glaucoma    H/O hiatal hernia    Hyperlipidemia    Hypertension    Psoriatic arthritis ([place])    sees Dr. Hurley Vance    Tachycardia-bradycardia syndrome [facility])     ROS:   All systems reviewed and negative except as noted in the HPI.   Past Surgical History:  Procedure Laterality Date   APPENDECTOMY     CARDIAC CATHETERIZATION  09/10/08   CARDIAC DEFIBRILLATOR PLACEMENT  11/18/2014   CATARACT EXTRACTION, BILATERAL  2020   at [facility]    COLONOSCOPY  05-27-10   per Dr, Andrew Vance, extensive diverticulosis, repeat in 10 yrs    EP IMPLANTABLE DEVICE N/A 11/18/2014   Procedure: BiV ICD Upgrade;  Surgeon: Andrew Lance, MD;  Location: [place] [facility];  Service: Cardiovascular;  Laterality: N/A;   ESOPHAGOGASTRODUODENOSCOPY  05-27-10   per Dr. Ardis Vance, normal    GLAUCOMA SURGERY Bilateral 2020   at [place] hand, fusions to DIPs and PIPs of fingers 1,2,4,and 5   INSERT / REPLACE / REMOVE PACEMAKER  2010   JOINT REPLACEMENT     PILONIDAL CYST EXCISION  1961   TOTAL HIP ARTHROPLASTY Bilateral 1998 `2001   bilateral, sees Dr. Para March    TOTAL HIP REVISION  02/25/2012   Procedure: TOTAL HIP REVISION;  Surgeon: Kerin Salen, MD;  Location: [place];  Service:  Orthopedics;  Laterality: Left;     Family History  Problem Relation Age of Onset   Hypertension Other    Heart disease Other      Social History   Socioeconomic History   Marital status: Married    Spouse name: Not on file   Number of children: Not on file   Years of education: Not on file   Highest education level: Not on file  Occupational History   Not on file  Tobacco Use   Smoking status: Former    Packs/day: 1.00    Years: 30.00    Pack years: 30.00    Types: Cigarettes, Pipe    Quit date: 02/17/1982    Years since quitting: 39.0   Smokeless tobacco: Former    Quit date: 05/01/1983  Vaping Use   Vaping Use: Never used  Substance and Sexual Activity   Alcohol use: Yes    Alcohol/week: 3.0 standard drinks    Types: 2 Shots of liquor, 1 Standard drinks or equivalent per week   Drug use: No   Sexual activity: Yes  Other Topics Concern   Not on file  Social History Narrative   Not on file   Social Determinants of Health   Financial Resource Strain: Not on file  Food Insecurity: Not on file  Transportation Needs: Not on file  Physical Activity: Not on file  Stress: Not on file  Social Connections: Not on file  Intimate Partner Violence: Not on file     BP 126/72   Pulse 71   Ht 5\' 8"  (1.727 m)   Wt 176 lb (79.8 kg)   SpO2 97%   BMI 26.76 kg/m   Physical Exam:  Well appearing NAD HEENT: Unremarkable Neck:  No JVD, no thyromegally Lymphatics:  No adenopathy Back:  No CVA tenderness Lungs:  Clear HEART:  Regular rate rhythm, no murmurs, no rubs, no clicks Abd:  soft, positive bowel sounds, no organomegally, no rebound, no guarding Ext:  2 plus pulses, no edema, no cyanosis, no clubbing Skin:  No rashes no nodules Neuro:  CN II through XII intact, motor grossly intact  EKG  DEVICE  Normal device function.  See PaceArt for details.   Assess/Plan:  1. Atrial fib - his VR is well controlled. 2. ICD - his St. Jude Biv ICD is working  normally.  3. Chronic systolic heart failure - he has class 2 symptoms. He will continue guideline directed medical therapy. 4. Dyslipidemia - he will continue his statin therapy.  Carleene Overlie Andrew Bonfield,MD

## 2021-03-09 LAB — CUP PACEART INCLINIC DEVICE CHECK
Battery Remaining Longevity: 25 mo
Brady Statistic RA Percent Paced: 0 %
Brady Statistic RV Percent Paced: 73 %
Date Time Interrogation Session: 20221109115600
HighPow Impedance: 66.375
Implantable Lead Implant Date: 20100521
Implantable Lead Implant Date: 20160721
Implantable Lead Implant Date: 20160721
Implantable Lead Location: 753858
Implantable Lead Location: 753859
Implantable Lead Location: 753860
Implantable Lead Model: 7122
Implantable Pulse Generator Implant Date: 20160721
Lead Channel Impedance Value: 375 Ohm
Lead Channel Impedance Value: 487.5 Ohm
Lead Channel Impedance Value: 712.5 Ohm
Lead Channel Pacing Threshold Amplitude: 1 V
Lead Channel Pacing Threshold Amplitude: 1 V
Lead Channel Pacing Threshold Amplitude: 1 V
Lead Channel Pacing Threshold Amplitude: 1 V
Lead Channel Pacing Threshold Pulse Width: 0.5 ms
Lead Channel Pacing Threshold Pulse Width: 0.5 ms
Lead Channel Pacing Threshold Pulse Width: 0.8 ms
Lead Channel Pacing Threshold Pulse Width: 0.8 ms
Lead Channel Sensing Intrinsic Amplitude: 7.3 mV
Lead Channel Setting Pacing Amplitude: 2 V
Lead Channel Setting Pacing Amplitude: 2 V
Lead Channel Setting Pacing Pulse Width: 0.5 ms
Lead Channel Setting Pacing Pulse Width: 0.8 ms
Lead Channel Setting Sensing Sensitivity: 0.5 mV
Pulse Gen Serial Number: 7289863

## 2021-03-10 NOTE — Progress Notes (Signed)
Remote ICD transmission.   

## 2021-03-16 ENCOUNTER — Telehealth: Payer: Self-pay

## 2021-03-16 NOTE — Telephone Encounter (Signed)
Last OV 01/25/20.   Pt notified of above; appt scheduled for 03/22/21.

## 2021-03-20 ENCOUNTER — Ambulatory Visit (INDEPENDENT_AMBULATORY_CARE_PROVIDER_SITE_OTHER): Payer: PPO

## 2021-03-20 ENCOUNTER — Other Ambulatory Visit: Payer: Self-pay

## 2021-03-20 DIAGNOSIS — Z5181 Encounter for therapeutic drug level monitoring: Secondary | ICD-10-CM

## 2021-03-20 DIAGNOSIS — I4891 Unspecified atrial fibrillation: Secondary | ICD-10-CM

## 2021-03-20 LAB — POCT INR: INR: 3 (ref 2.0–3.0)

## 2021-03-20 NOTE — Patient Instructions (Signed)
Description   Continue taking Warfarin 1 tablet (5mg ) daily. Recheck INR in 6 weeks. [phone] [id number]

## 2021-03-22 ENCOUNTER — Encounter: Payer: PPO | Admitting: Family Medicine

## 2021-03-30 ENCOUNTER — Other Ambulatory Visit: Payer: Self-pay | Admitting: Internal Medicine

## 2021-04-05 ENCOUNTER — Encounter: Payer: Self-pay | Admitting: Family Medicine

## 2021-04-05 ENCOUNTER — Ambulatory Visit (INDEPENDENT_AMBULATORY_CARE_PROVIDER_SITE_OTHER): Payer: PPO | Admitting: Family Medicine

## 2021-04-05 VITALS — BP 128/60 | HR 70 | Temp 98.1°F | Ht 68.0 in | Wt 177.1 lb

## 2021-04-05 DIAGNOSIS — R1314 Dysphagia, pharyngoesophageal phase: Secondary | ICD-10-CM

## 2021-04-05 DIAGNOSIS — Z0000 Encounter for general adult medical examination without abnormal findings: Secondary | ICD-10-CM | POA: Diagnosis not present

## 2021-04-05 LAB — CBC WITH DIFFERENTIAL/PLATELET
Basophils Absolute: 0 10*3/uL (ref 0.0–0.1)
Basophils Relative: 0.4 % (ref 0.0–3.0)
Eosinophils Absolute: 0.1 10*3/uL (ref 0.0–0.7)
Eosinophils Relative: 1.6 % (ref 0.0–5.0)
HCT: 48.9 % (ref 39.0–52.0)
Hemoglobin: 16.3 g/dL (ref 13.0–17.0)
Lymphocytes Relative: 15.5 % (ref 12.0–46.0)
Lymphs Abs: 1 10*3/uL (ref 0.7–4.0)
MCHC: 33.3 g/dL (ref 30.0–36.0)
MCV: 99.6 fl (ref 78.0–100.0)
Monocytes Absolute: 0.6 10*3/uL (ref 0.1–1.0)
Monocytes Relative: 9.2 % (ref 3.0–12.0)
Neutro Abs: 4.6 10*3/uL (ref 1.4–7.7)
Neutrophils Relative %: 73.3 % (ref 43.0–77.0)
Platelets: 147 10*3/uL — ABNORMAL LOW (ref 150.0–400.0)
RBC: 4.91 Mil/uL (ref 4.22–5.81)
RDW: 14.1 % (ref 11.5–15.5)
WBC: 6.2 10*3/uL (ref 4.0–10.5)

## 2021-04-05 LAB — LIPID PANEL
Cholesterol: 125 mg/dL (ref 0–200)
HDL: 58.1 mg/dL (ref >39.00)
LDL Cholesterol: 51 mg/dL (ref 0–99)
NonHDL: 66.98
Total CHOL/HDL Ratio: 2
Triglycerides: 78 mg/dL (ref 0.0–149.0)
VLDL: 15.6 mg/dL (ref 0.0–40.0)

## 2021-04-05 LAB — BASIC METABOLIC PANEL
BUN: 16 mg/dL (ref 6–23)
CO2: 30 mEq/L (ref 19–32)
Calcium: 9.6 mg/dL (ref 8.4–10.5)
Chloride: 104 mEq/L (ref 96–112)
Creatinine, Ser: 0.95 mg/dL (ref 0.40–1.50)
GFR: 74.19 mL/min (ref >60.00)
Glucose, Bld: 83 mg/dL (ref 70–99)
Potassium: 4.4 mEq/L (ref 3.5–5.1)
Sodium: 140 mEq/L (ref 135–145)

## 2021-04-05 LAB — HEPATIC FUNCTION PANEL
ALT: 16 U/L (ref 0–53)
AST: 22 U/L (ref 0–37)
Albumin: 4.2 g/dL (ref 3.5–5.2)
Alkaline Phosphatase: 79 U/L (ref 39–117)
Bilirubin, Direct: 0.2 mg/dL (ref 0.0–0.3)
Total Bilirubin: 1.1 mg/dL (ref 0.2–1.2)
Total Protein: 7 g/dL (ref 6.0–8.3)

## 2021-04-05 LAB — PSA: PSA: 3.29 ng/mL (ref 0.10–4.00)

## 2021-04-05 LAB — HEMOGLOBIN A1C: Hgb A1c MFr Bld: 5.7 % (ref 4.6–6.5)

## 2021-04-05 LAB — TSH: TSH: 1.86 u[IU]/mL (ref 0.35–5.50)

## 2021-04-05 NOTE — Progress Notes (Signed)
Subjective:    Patient ID: Andrew Vance, male    DOB: 01-24-1936, 85 y.o.   MRN: [id number]  HPI Here for a well exam. He has a few items to discuss. He is monitored closely by Cardiology for his HTN, CHF, cardiomyopathy, and atrial fibrillation. He denies any chest pain or SOB or palpitations. He does describe some intermittent trouble swallowing when eating. He says it feels like the food (the worst is bread) gets stuck at the back of his throat. No heartburn or belching. He also fell while working in his yard about 6 weeks and landed with his chest over a stump. He has had sharp pains in the right ribs since then, but they are steadily getting better. No trouble breathing. His glaucoma is stable per Dr. Delman Cheadle.    Review of Systems  Constitutional: Negative.   HENT:  Positive for trouble swallowing.   Eyes: Negative.   Respiratory: Negative.    Cardiovascular:  Positive for chest pain.  Gastrointestinal: Negative.   Genitourinary: Negative.   Musculoskeletal: Negative.   Skin: Negative.   Neurological: Negative.   Psychiatric/Behavioral: Negative.        Objective:   Physical Exam Constitutional:      General: He is not in acute distress.    Appearance: Normal appearance. He is well-developed. He is not diaphoretic.  HENT:     Head: Normocephalic and atraumatic.     Right Ear: External ear normal.     Left Ear: External ear normal.     Nose: Nose normal.     Mouth/Throat:     Pharynx: No oropharyngeal exudate.  Eyes:     General: No scleral icterus.       Right eye: No discharge.        Left eye: No discharge.     Conjunctiva/sclera: Conjunctivae normal.     Pupils: Pupils are equal, round, and reactive to light.  Neck:     Thyroid: No thyromegaly.     Vascular: No JVD.     Trachea: No tracheal deviation.  Cardiovascular:     Rate and Rhythm: Normal rate and regular rhythm.     Heart sounds: Normal heart sounds. No murmur heard.   No friction rub. No gallop.   Pulmonary:     Effort: Pulmonary effort is normal. No respiratory distress.     Breath sounds: Normal breath sounds. No wheezing or rales.     Comments: The right ribs are normal, no tenderness or crepitus  Chest:     Chest wall: No tenderness.  Abdominal:     General: Bowel sounds are normal. There is no distension.     Palpations: Abdomen is soft. There is no mass.     Tenderness: There is no abdominal tenderness. There is no guarding or rebound.  Genitourinary:    Penis: Normal. No tenderness.      Testes: Normal.     Prostate: Normal.     Rectum: Normal. Guaiac result negative.  Musculoskeletal:        General: No tenderness. Normal range of motion.     Cervical back: Neck supple.  Lymphadenopathy:     Cervical: No cervical adenopathy.  Skin:    General: Skin is warm and dry.     Coloration: Skin is not pale.     Findings: No erythema or rash.  Neurological:     General: No focal deficit present.     Mental Status: He is alert and oriented to  person, place, and time.     Cranial Nerves: No cranial nerve deficit.     Motor: No abnormal muscle tone.     Coordination: Coordination normal.     Deep Tendon Reflexes: Reflexes are normal and symmetric. Reflexes normal.  Psychiatric:        Behavior: Behavior normal.        Thought Content: Thought content normal.        Judgment: Judgment normal.          Assessment & Plan:  Well exam. We discussed diet and exercise. Get fasting labs. He has a rib contusion that is slowly healing. No intervention is needed. He also has some dysphagia, so we will refer him to GI.  Alysia Penna, MD

## 2021-04-16 ENCOUNTER — Other Ambulatory Visit: Payer: Self-pay | Admitting: Internal Medicine

## 2021-05-03 ENCOUNTER — Other Ambulatory Visit: Payer: Self-pay

## 2021-05-03 ENCOUNTER — Ambulatory Visit (INDEPENDENT_AMBULATORY_CARE_PROVIDER_SITE_OTHER): Payer: PPO

## 2021-05-03 DIAGNOSIS — I4891 Unspecified atrial fibrillation: Secondary | ICD-10-CM | POA: Diagnosis not present

## 2021-05-03 DIAGNOSIS — Z5181 Encounter for therapeutic drug level monitoring: Secondary | ICD-10-CM

## 2021-05-03 LAB — POCT INR: INR: 3.1 — AB (ref 2.0–3.0)

## 2021-05-03 NOTE — Patient Instructions (Signed)
Description   Continue taking Warfarin 1 tablet (5mg ) daily. Recheck INR in 6 weeks. [phone] [id number]

## 2021-06-05 ENCOUNTER — Ambulatory Visit (INDEPENDENT_AMBULATORY_CARE_PROVIDER_SITE_OTHER): Payer: PPO

## 2021-06-05 DIAGNOSIS — I495 Sick sinus syndrome: Secondary | ICD-10-CM | POA: Diagnosis not present

## 2021-06-06 LAB — CUP PACEART REMOTE DEVICE CHECK
Battery Remaining Longevity: 25 mo
Battery Remaining Percentage: 28 %
Battery Voltage: 2.84 V
Date Time Interrogation Session: 20230206124429
HighPow Impedance: 56 Ohm
HighPow Impedance: 56 Ohm
Implantable Lead Implant Date: 20100521
Implantable Lead Implant Date: 20160721
Implantable Lead Implant Date: 20160721
Implantable Lead Location: 753858
Implantable Lead Location: 753859
Implantable Lead Location: 753860
Implantable Lead Model: 7122
Implantable Pulse Generator Implant Date: 20160721
Lead Channel Impedance Value: 490 Ohm
Lead Channel Impedance Value: 700 Ohm
Lead Channel Pacing Threshold Amplitude: 1 V
Lead Channel Pacing Threshold Amplitude: 1 V
Lead Channel Pacing Threshold Pulse Width: 0.5 ms
Lead Channel Pacing Threshold Pulse Width: 0.8 ms
Lead Channel Sensing Intrinsic Amplitude: 5.2 mV
Lead Channel Setting Pacing Amplitude: 2 V
Lead Channel Setting Pacing Amplitude: 2 V
Lead Channel Setting Pacing Pulse Width: 0.5 ms
Lead Channel Setting Pacing Pulse Width: 0.8 ms
Lead Channel Setting Sensing Sensitivity: 0.5 mV
Pulse Gen Serial Number: 7289863

## 2021-06-08 NOTE — Progress Notes (Signed)
Remote ICD transmission.   

## 2021-06-14 ENCOUNTER — Other Ambulatory Visit: Payer: Self-pay

## 2021-06-14 ENCOUNTER — Ambulatory Visit (INDEPENDENT_AMBULATORY_CARE_PROVIDER_SITE_OTHER): Payer: PPO | Admitting: *Deleted

## 2021-06-14 DIAGNOSIS — I4891 Unspecified atrial fibrillation: Secondary | ICD-10-CM | POA: Diagnosis not present

## 2021-06-14 DIAGNOSIS — Z5181 Encounter for therapeutic drug level monitoring: Secondary | ICD-10-CM | POA: Diagnosis not present

## 2021-06-14 LAB — POCT INR: INR: 3.5 — AB (ref 2.0–3.0)

## 2021-06-14 NOTE — Patient Instructions (Signed)
Description   Do not take any Warfarin tomorrow then start taking Warfarin 1 tablet (5mg ) daily except 1/2 tablet of Sundays. Recheck INR in 4 weeks. Coumadin Clinic [phone]

## 2021-06-26 ENCOUNTER — Encounter: Payer: Self-pay | Admitting: Family Medicine

## 2021-07-08 ENCOUNTER — Other Ambulatory Visit: Payer: Self-pay | Admitting: Family Medicine

## 2021-07-11 ENCOUNTER — Other Ambulatory Visit: Payer: Self-pay

## 2021-07-11 ENCOUNTER — Ambulatory Visit (INDEPENDENT_AMBULATORY_CARE_PROVIDER_SITE_OTHER): Payer: PPO | Admitting: *Deleted

## 2021-07-11 DIAGNOSIS — Z5181 Encounter for therapeutic drug level monitoring: Secondary | ICD-10-CM | POA: Diagnosis not present

## 2021-07-11 DIAGNOSIS — I4891 Unspecified atrial fibrillation: Secondary | ICD-10-CM | POA: Diagnosis not present

## 2021-07-11 LAB — POCT INR: INR: 3 (ref 2.0–3.0)

## 2021-07-11 NOTE — Patient Instructions (Signed)
Description   ?Continue taking Warfarin 1 tablet ('5mg'$ ) daily except 1/2 tablet of Sundays. Recheck INR in 4 weeks. Coumadin Clinic [phone] ?  ?  ?

## 2021-08-08 ENCOUNTER — Ambulatory Visit (INDEPENDENT_AMBULATORY_CARE_PROVIDER_SITE_OTHER): Payer: PPO

## 2021-08-08 DIAGNOSIS — I4891 Unspecified atrial fibrillation: Secondary | ICD-10-CM

## 2021-08-08 DIAGNOSIS — Z5181 Encounter for therapeutic drug level monitoring: Secondary | ICD-10-CM | POA: Diagnosis not present

## 2021-08-08 LAB — POCT INR: INR: 3.9 — AB (ref 2.0–3.0)

## 2021-08-08 NOTE — Patient Instructions (Signed)
Description   ?Skip tomorrow's dosage of Warfarin, then start taking Warfarin 1 tablet ('5mg'$ ) daily except 1/2 tablet of Sundays and Thursdays. Recheck INR in 3 weeks. Coumadin Clinic [phone] ?  ?  ?

## 2021-08-29 ENCOUNTER — Ambulatory Visit (INDEPENDENT_AMBULATORY_CARE_PROVIDER_SITE_OTHER): Payer: PPO

## 2021-08-29 DIAGNOSIS — I4891 Unspecified atrial fibrillation: Secondary | ICD-10-CM | POA: Diagnosis not present

## 2021-08-29 DIAGNOSIS — Z5181 Encounter for therapeutic drug level monitoring: Secondary | ICD-10-CM | POA: Diagnosis not present

## 2021-08-29 LAB — POCT INR: INR: 2.3 (ref 2.0–3.0)

## 2021-08-29 NOTE — Patient Instructions (Signed)
Description   ?Continue on same dosage of Warfarin 1 tablet ('5mg'$ ) daily except 1/2 tablet of Sundays and Thursdays. Recheck INR in 4 weeks. Coumadin Clinic [phone] ?  ?  ?

## 2021-08-31 DIAGNOSIS — Z85828 Personal history of other malignant neoplasm of skin: Secondary | ICD-10-CM | POA: Diagnosis not present

## 2021-08-31 DIAGNOSIS — D692 Other nonthrombocytopenic purpura: Secondary | ICD-10-CM | POA: Diagnosis not present

## 2021-08-31 DIAGNOSIS — L309 Dermatitis, unspecified: Secondary | ICD-10-CM | POA: Diagnosis not present

## 2021-08-31 DIAGNOSIS — L812 Freckles: Secondary | ICD-10-CM | POA: Diagnosis not present

## 2021-08-31 DIAGNOSIS — L820 Inflamed seborrheic keratosis: Secondary | ICD-10-CM | POA: Diagnosis not present

## 2021-08-31 DIAGNOSIS — L821 Other seborrheic keratosis: Secondary | ICD-10-CM | POA: Diagnosis not present

## 2021-08-31 DIAGNOSIS — L570 Actinic keratosis: Secondary | ICD-10-CM | POA: Diagnosis not present

## 2021-09-04 ENCOUNTER — Ambulatory Visit (INDEPENDENT_AMBULATORY_CARE_PROVIDER_SITE_OTHER): Payer: PPO

## 2021-09-04 DIAGNOSIS — I4891 Unspecified atrial fibrillation: Secondary | ICD-10-CM

## 2021-09-05 LAB — CUP PACEART REMOTE DEVICE CHECK
Battery Remaining Longevity: 25 mo
Battery Remaining Percentage: 28 %
Battery Voltage: 2.84 V
Date Time Interrogation Session: 20230508061530
HighPow Impedance: 64 Ohm
HighPow Impedance: 64 Ohm
Implantable Lead Implant Date: 20100521
Implantable Lead Implant Date: 20160721
Implantable Lead Implant Date: 20160721
Implantable Lead Location: 753858
Implantable Lead Location: 753859
Implantable Lead Location: 753860
Implantable Lead Model: 7122
Implantable Pulse Generator Implant Date: 20160721
Lead Channel Impedance Value: 510 Ohm
Lead Channel Impedance Value: 660 Ohm
Lead Channel Pacing Threshold Amplitude: 1 V
Lead Channel Pacing Threshold Amplitude: 1 V
Lead Channel Pacing Threshold Pulse Width: 0.5 ms
Lead Channel Pacing Threshold Pulse Width: 0.8 ms
Lead Channel Sensing Intrinsic Amplitude: 8.2 mV
Lead Channel Setting Pacing Amplitude: 2 V
Lead Channel Setting Pacing Amplitude: 2 V
Lead Channel Setting Pacing Pulse Width: 0.5 ms
Lead Channel Setting Pacing Pulse Width: 0.8 ms
Lead Channel Setting Sensing Sensitivity: 0.5 mV
Pulse Gen Serial Number: 7289863

## 2021-09-19 NOTE — Progress Notes (Signed)
Remote ICD transmission.   

## 2021-09-26 ENCOUNTER — Ambulatory Visit (INDEPENDENT_AMBULATORY_CARE_PROVIDER_SITE_OTHER): Payer: PPO | Admitting: *Deleted

## 2021-09-26 DIAGNOSIS — Z5181 Encounter for therapeutic drug level monitoring: Secondary | ICD-10-CM | POA: Diagnosis not present

## 2021-09-26 DIAGNOSIS — I4891 Unspecified atrial fibrillation: Secondary | ICD-10-CM

## 2021-09-26 LAB — POCT INR: INR: 1.9 — AB (ref 2.0–3.0)

## 2021-09-26 NOTE — Patient Instructions (Signed)
Description   Take 1.5 tablets of warfarin today,  Then continue on same dosage of Warfarin 1 tablet ('5mg'$ ) daily except 1/2 tablet of Sundays and Thursdays. Recheck INR in 3 weeks. Coumadin Clinic [phone]

## 2021-09-28 DIAGNOSIS — H4720 Unspecified optic atrophy: Secondary | ICD-10-CM | POA: Diagnosis not present

## 2021-09-28 DIAGNOSIS — H353121 Nonexudative age-related macular degeneration, left eye, early dry stage: Secondary | ICD-10-CM | POA: Diagnosis not present

## 2021-09-28 DIAGNOSIS — H0100A Unspecified blepharitis right eye, upper and lower eyelids: Secondary | ICD-10-CM | POA: Diagnosis not present

## 2021-09-28 DIAGNOSIS — H401132 Primary open-angle glaucoma, bilateral, moderate stage: Secondary | ICD-10-CM | POA: Diagnosis not present

## 2021-10-17 ENCOUNTER — Ambulatory Visit (INDEPENDENT_AMBULATORY_CARE_PROVIDER_SITE_OTHER): Payer: PPO | Admitting: *Deleted

## 2021-10-17 DIAGNOSIS — I4891 Unspecified atrial fibrillation: Secondary | ICD-10-CM

## 2021-10-17 DIAGNOSIS — Z5181 Encounter for therapeutic drug level monitoring: Secondary | ICD-10-CM

## 2021-10-17 LAB — POCT INR: INR: 1.8 — AB (ref 2.0–3.0)

## 2021-10-17 NOTE — Patient Instructions (Addendum)
Description   Take 1.5 tablets of warfarin today, then start taking Warfarin 1 tablet ('5mg'$ ) daily except 1/2 tablet on Sundays. Recheck INR in 3 weeks. Coumadin Clinic [phone]

## 2021-10-18 ENCOUNTER — Other Ambulatory Visit: Payer: Self-pay | Admitting: Internal Medicine

## 2021-11-08 ENCOUNTER — Ambulatory Visit (INDEPENDENT_AMBULATORY_CARE_PROVIDER_SITE_OTHER): Payer: PPO | Admitting: *Deleted

## 2021-11-08 DIAGNOSIS — Z5181 Encounter for therapeutic drug level monitoring: Secondary | ICD-10-CM

## 2021-11-08 DIAGNOSIS — I4891 Unspecified atrial fibrillation: Secondary | ICD-10-CM | POA: Diagnosis not present

## 2021-11-08 LAB — POCT INR: INR: 2.5 (ref 2.0–3.0)

## 2021-11-08 NOTE — Patient Instructions (Signed)
Description   Continue taking Warfarin 1 tablet ('5mg'$ ) daily except 1/2 tablet on Sundays. Recheck INR in 4 weeks. Coumadin Clinic [phone]

## 2021-12-04 ENCOUNTER — Ambulatory Visit (INDEPENDENT_AMBULATORY_CARE_PROVIDER_SITE_OTHER): Payer: PPO

## 2021-12-04 DIAGNOSIS — I4891 Unspecified atrial fibrillation: Secondary | ICD-10-CM

## 2021-12-05 LAB — CUP PACEART REMOTE DEVICE CHECK
Battery Remaining Longevity: 22 mo
Battery Remaining Longevity: 22 mo
Battery Remaining Percentage: 23 %
Battery Remaining Percentage: 23 %
Battery Voltage: 2.8 V
Battery Voltage: 2.8 V
Date Time Interrogation Session: 20230805124627
Date Time Interrogation Session: 20230807025246
HighPow Impedance: 69 Ohm
HighPow Impedance: 69 Ohm
HighPow Impedance: 66 Ohm
HighPow Impedance: 66 Ohm
Implantable Lead Implant Date: 20100521
Implantable Lead Implant Date: 20160721
Implantable Lead Implant Date: 20160721
Implantable Lead Implant Date: 20100521
Implantable Lead Implant Date: 20160721
Implantable Lead Implant Date: 20160721
Implantable Lead Location: 753858
Implantable Lead Location: 753859
Implantable Lead Location: 753860
Implantable Lead Location: 753858
Implantable Lead Location: 753859
Implantable Lead Location: 753860
Implantable Lead Model: 7122
Implantable Lead Model: 7122
Implantable Pulse Generator Implant Date: 20160721
Implantable Pulse Generator Implant Date: 20160721
Lead Channel Impedance Value: 580 Ohm
Lead Channel Impedance Value: 680 Ohm
Lead Channel Impedance Value: 590 Ohm
Lead Channel Impedance Value: 700 Ohm
Lead Channel Pacing Threshold Amplitude: 1 V
Lead Channel Pacing Threshold Amplitude: 1 V
Lead Channel Pacing Threshold Amplitude: 1 V
Lead Channel Pacing Threshold Amplitude: 1 V
Lead Channel Pacing Threshold Pulse Width: 0.5 ms
Lead Channel Pacing Threshold Pulse Width: 0.8 ms
Lead Channel Pacing Threshold Pulse Width: 0.5 ms
Lead Channel Pacing Threshold Pulse Width: 0.8 ms
Lead Channel Sensing Intrinsic Amplitude: 6.2 mV
Lead Channel Sensing Intrinsic Amplitude: 6.4 mV
Lead Channel Setting Pacing Amplitude: 2 V
Lead Channel Setting Pacing Amplitude: 2 V
Lead Channel Setting Pacing Amplitude: 2 V
Lead Channel Setting Pacing Amplitude: 2 V
Lead Channel Setting Pacing Pulse Width: 0.5 ms
Lead Channel Setting Pacing Pulse Width: 0.8 ms
Lead Channel Setting Pacing Pulse Width: 0.5 ms
Lead Channel Setting Pacing Pulse Width: 0.8 ms
Lead Channel Setting Sensing Sensitivity: 0.5 mV
Lead Channel Setting Sensing Sensitivity: 0.5 mV
Pulse Gen Serial Number: 7289863
Pulse Gen Serial Number: 7289863

## 2021-12-06 ENCOUNTER — Ambulatory Visit (INDEPENDENT_AMBULATORY_CARE_PROVIDER_SITE_OTHER): Payer: PPO | Admitting: *Deleted

## 2021-12-06 DIAGNOSIS — Z5181 Encounter for therapeutic drug level monitoring: Secondary | ICD-10-CM

## 2021-12-06 DIAGNOSIS — I4891 Unspecified atrial fibrillation: Secondary | ICD-10-CM

## 2021-12-06 LAB — POCT INR: INR: 2.8 (ref 2.0–3.0)

## 2021-12-06 NOTE — Patient Instructions (Addendum)
Description   Continue taking Warfarin 1 tablet ('5mg'$ ) daily except 1/2 tablet on Sundays. Recheck INR in 5 weeks. Coumadin Clinic [phone] or [phone]

## 2021-12-31 ENCOUNTER — Other Ambulatory Visit: Payer: Self-pay | Admitting: Internal Medicine

## 2022-01-09 NOTE — Progress Notes (Signed)
Remote ICD transmission.   

## 2022-01-10 ENCOUNTER — Ambulatory Visit: Payer: PPO | Attending: Cardiovascular Disease | Admitting: *Deleted

## 2022-01-10 DIAGNOSIS — I4891 Unspecified atrial fibrillation: Secondary | ICD-10-CM

## 2022-01-10 DIAGNOSIS — Z5181 Encounter for therapeutic drug level monitoring: Secondary | ICD-10-CM | POA: Diagnosis not present

## 2022-01-10 LAB — POCT INR: INR: 2.6 (ref 2.0–3.0)

## 2022-01-10 NOTE — Patient Instructions (Signed)
Description   Continue taking Warfarin 1 tablet ('5mg'$ ) daily except 1/2 tablet on Sundays. Recheck INR in 6 weeks. Coumadin Clinic [phone] or [phone]

## 2022-01-21 ENCOUNTER — Other Ambulatory Visit: Payer: Self-pay | Admitting: Family Medicine

## 2022-02-21 ENCOUNTER — Ambulatory Visit: Payer: PPO | Attending: Internal Medicine | Admitting: *Deleted

## 2022-02-21 DIAGNOSIS — Z5181 Encounter for therapeutic drug level monitoring: Secondary | ICD-10-CM | POA: Diagnosis not present

## 2022-02-21 DIAGNOSIS — I4891 Unspecified atrial fibrillation: Secondary | ICD-10-CM | POA: Diagnosis not present

## 2022-02-21 LAB — POCT INR: INR: 3.3 — AB (ref 2.0–3.0)

## 2022-02-21 NOTE — Patient Instructions (Signed)
Description   Do not take any warfarin tomorrow then continue taking Warfarin 1 tablet ('5mg'$ ) daily except 1/2 tablet on Sundays. Recheck INR in 5 weeks. Coumadin Clinic [phone] or [phone]

## 2022-03-05 ENCOUNTER — Ambulatory Visit (INDEPENDENT_AMBULATORY_CARE_PROVIDER_SITE_OTHER): Payer: PPO

## 2022-03-05 DIAGNOSIS — I4891 Unspecified atrial fibrillation: Secondary | ICD-10-CM

## 2022-03-07 DIAGNOSIS — I4891 Unspecified atrial fibrillation: Secondary | ICD-10-CM | POA: Diagnosis not present

## 2022-03-07 DIAGNOSIS — Z95810 Presence of automatic (implantable) cardiac defibrillator: Secondary | ICD-10-CM | POA: Diagnosis not present

## 2022-03-07 LAB — CUP PACEART REMOTE DEVICE CHECK
Battery Remaining Longevity: 22 mo
Battery Remaining Percentage: 24 %
Battery Voltage: 2.8 V
Date Time Interrogation Session: 20231106022527
HighPow Impedance: 69 Ohm
HighPow Impedance: 69 Ohm
Implantable Lead Connection Status: 753985
Implantable Lead Connection Status: 753985
Implantable Lead Connection Status: 753985
Implantable Lead Implant Date: 20100521
Implantable Lead Implant Date: 20160721
Implantable Lead Implant Date: 20160721
Implantable Lead Location: 753858
Implantable Lead Location: 753859
Implantable Lead Location: 753860
Implantable Lead Model: 7122
Implantable Pulse Generator Implant Date: 20160721
Lead Channel Impedance Value: 650 Ohm
Lead Channel Impedance Value: 790 Ohm
Lead Channel Pacing Threshold Amplitude: 1 V
Lead Channel Pacing Threshold Amplitude: 1 V
Lead Channel Pacing Threshold Pulse Width: 0.5 ms
Lead Channel Pacing Threshold Pulse Width: 0.8 ms
Lead Channel Sensing Intrinsic Amplitude: 7.7 mV
Lead Channel Setting Pacing Amplitude: 2 V
Lead Channel Setting Pacing Amplitude: 2 V
Lead Channel Setting Pacing Pulse Width: 0.5 ms
Lead Channel Setting Pacing Pulse Width: 0.8 ms
Lead Channel Setting Sensing Sensitivity: 0.5 mV
Pulse Gen Serial Number: 7289863
Zone Setting Status: 755011

## 2022-03-28 ENCOUNTER — Ambulatory Visit: Payer: PPO | Attending: Internal Medicine

## 2022-03-28 DIAGNOSIS — I4891 Unspecified atrial fibrillation: Secondary | ICD-10-CM

## 2022-03-28 DIAGNOSIS — Z5181 Encounter for therapeutic drug level monitoring: Secondary | ICD-10-CM

## 2022-03-28 LAB — POCT INR: INR: 2.7 (ref 2.0–3.0)

## 2022-03-28 NOTE — Patient Instructions (Signed)
Description   Continue taking Warfarin 1 tablet ('5mg'$ ) daily except 1/2 tablet on Sundays. Recheck INR in 6 weeks.  Coumadin Clinic [phone]

## 2022-03-30 DIAGNOSIS — H4720 Unspecified optic atrophy: Secondary | ICD-10-CM | POA: Diagnosis not present

## 2022-03-30 DIAGNOSIS — H401132 Primary open-angle glaucoma, bilateral, moderate stage: Secondary | ICD-10-CM | POA: Diagnosis not present

## 2022-03-30 DIAGNOSIS — H02831 Dermatochalasis of right upper eyelid: Secondary | ICD-10-CM | POA: Diagnosis not present

## 2022-03-30 DIAGNOSIS — H02834 Dermatochalasis of left upper eyelid: Secondary | ICD-10-CM | POA: Diagnosis not present

## 2022-03-30 DIAGNOSIS — H353131 Nonexudative age-related macular degeneration, bilateral, early dry stage: Secondary | ICD-10-CM | POA: Diagnosis not present

## 2022-04-04 ENCOUNTER — Other Ambulatory Visit: Payer: Self-pay | Admitting: Internal Medicine

## 2022-04-05 ENCOUNTER — Ambulatory Visit (INDEPENDENT_AMBULATORY_CARE_PROVIDER_SITE_OTHER): Payer: PPO | Admitting: Family Medicine

## 2022-04-05 ENCOUNTER — Encounter: Payer: Self-pay | Admitting: Family Medicine

## 2022-04-05 VITALS — BP 124/88 | HR 76 | Temp 97.6°F | Ht 68.0 in | Wt 176.5 lb

## 2022-04-05 DIAGNOSIS — Z0000 Encounter for general adult medical examination without abnormal findings: Secondary | ICD-10-CM | POA: Diagnosis not present

## 2022-04-05 LAB — LIPID PANEL
Cholesterol: 144 mg/dL (ref 0–200)
HDL: 57.3 mg/dL (ref >39.00)
LDL Cholesterol: 70 mg/dL (ref 0–99)
NonHDL: 86.79
Total CHOL/HDL Ratio: 3
Triglycerides: 84 mg/dL (ref 0.0–149.0)
VLDL: 16.8 mg/dL (ref 0.0–40.0)

## 2022-04-05 LAB — CBC WITH DIFFERENTIAL/PLATELET
Basophils Absolute: 0 10*3/uL (ref 0.0–0.1)
Basophils Relative: 0.8 % (ref 0.0–3.0)
Eosinophils Absolute: 0.1 10*3/uL (ref 0.0–0.7)
Eosinophils Relative: 2.4 % (ref 0.0–5.0)
HCT: 49.3 % (ref 39.0–52.0)
Hemoglobin: 16.3 g/dL (ref 13.0–17.0)
Lymphocytes Relative: 21.3 % (ref 12.0–46.0)
Lymphs Abs: 1.3 10*3/uL (ref 0.7–4.0)
MCHC: 33.2 g/dL (ref 30.0–36.0)
MCV: 98.7 fl (ref 78.0–100.0)
Monocytes Absolute: 0.6 10*3/uL (ref 0.1–1.0)
Monocytes Relative: 10.1 % (ref 3.0–12.0)
Neutro Abs: 3.9 10*3/uL (ref 1.4–7.7)
Neutrophils Relative %: 65.4 % (ref 43.0–77.0)
Platelets: 171 10*3/uL (ref 150.0–400.0)
RBC: 4.99 Mil/uL (ref 4.22–5.81)
RDW: 14 % (ref 11.5–15.5)
WBC: 6 10*3/uL (ref 4.0–10.5)

## 2022-04-05 LAB — HEPATIC FUNCTION PANEL
ALT: 18 U/L (ref 0–53)
AST: 24 U/L (ref 0–37)
Albumin: 4.4 g/dL (ref 3.5–5.2)
Alkaline Phosphatase: 76 U/L (ref 39–117)
Bilirubin, Direct: 0.2 mg/dL (ref 0.0–0.3)
Total Bilirubin: 0.9 mg/dL (ref 0.2–1.2)
Total Protein: 7.3 g/dL (ref 6.0–8.3)

## 2022-04-05 LAB — BASIC METABOLIC PANEL
BUN: 19 mg/dL (ref 6–23)
CO2: 27 mEq/L (ref 19–32)
Calcium: 9.5 mg/dL (ref 8.4–10.5)
Chloride: 104 mEq/L (ref 96–112)
Creatinine, Ser: 0.96 mg/dL (ref 0.40–1.50)
GFR: 72.76 mL/min (ref >60.00)
Glucose, Bld: 86 mg/dL (ref 70–99)
Potassium: 4.6 mEq/L (ref 3.5–5.1)
Sodium: 142 mEq/L (ref 135–145)

## 2022-04-05 LAB — PSA: PSA: 3.32 ng/mL (ref 0.10–4.00)

## 2022-04-05 LAB — TSH: TSH: 2.2 u[IU]/mL (ref 0.35–5.50)

## 2022-04-05 LAB — HEMOGLOBIN A1C: Hgb A1c MFr Bld: 5.9 % (ref 4.6–6.5)

## 2022-04-05 MED ORDER — ROSUVASTATIN CALCIUM 10 MG PO TABS: 10.0000 mg | ORAL_TABLET | Freq: Every day | ORAL | 3 refills | Status: DC

## 2022-04-05 NOTE — Progress Notes (Signed)
   Subjective:    Patient ID: Andrew Vance, male    DOB: 21-May-1935, 86 y.o.   MRN: [id number]  HPI Here for a well exam. He feels well. He says he is bored since he retired and he has no hobbies. He sees Cardiology frequently.    Review of Systems  Constitutional: Negative.   HENT: Negative.    Eyes: Negative.   Respiratory: Negative.    Cardiovascular: Negative.   Gastrointestinal: Negative.   Genitourinary: Negative.   Musculoskeletal: Negative.   Skin: Negative.   Neurological: Negative.   Psychiatric/Behavioral: Negative.         Objective:   Physical Exam Constitutional:      General: He is not in acute distress.    Appearance: Normal appearance. He is well-developed. He is not diaphoretic.  HENT:     Head: Normocephalic and atraumatic.     Right Ear: External ear normal.     Left Ear: External ear normal.     Nose: Nose normal.     Mouth/Throat:     Pharynx: No oropharyngeal exudate.  Eyes:     General: No scleral icterus.       Right eye: No discharge.        Left eye: No discharge.     Conjunctiva/sclera: Conjunctivae normal.     Pupils: Pupils are equal, round, and reactive to light.  Neck:     Thyroid: No thyromegaly.     Vascular: No JVD.     Trachea: No tracheal deviation.  Cardiovascular:     Rate and Rhythm: Normal rate and regular rhythm.     Heart sounds: Normal heart sounds. No murmur heard.    No friction rub. No gallop.  Pulmonary:     Effort: Pulmonary effort is normal. No respiratory distress.     Breath sounds: Normal breath sounds. No wheezing or rales.  Chest:     Chest wall: No tenderness.  Abdominal:     General: Bowel sounds are normal. There is no distension.     Palpations: Abdomen is soft. There is no mass.     Tenderness: There is no abdominal tenderness. There is no guarding or rebound.  Genitourinary:    Penis: Normal. No tenderness.      Testes: Normal.  Musculoskeletal:        General: No tenderness. Normal range of  motion.     Cervical back: Neck supple.  Lymphadenopathy:     Cervical: No cervical adenopathy.  Skin:    General: Skin is warm and dry.     Coloration: Skin is not pale.     Findings: No erythema or rash.  Neurological:     Mental Status: He is alert and oriented to person, place, and time.     Cranial Nerves: No cranial nerve deficit.     Motor: No abnormal muscle tone.     Coordination: Coordination normal.     Deep Tendon Reflexes: Reflexes are normal and symmetric. Reflexes normal.  Psychiatric:        Behavior: Behavior normal.        Thought Content: Thought content normal.        Judgment: Judgment normal.           Assessment & Plan:  Well exam. We discussed diet and exercise. Get fasting labs. I suggested he either get a part time job or volunteer, and he said he would think about it.  Alysia Penna, MD

## 2022-04-12 NOTE — Progress Notes (Signed)
Remote ICD transmission.   

## 2022-04-18 ENCOUNTER — Other Ambulatory Visit: Payer: Self-pay | Admitting: Internal Medicine

## 2022-04-18 NOTE — Progress Notes (Unsigned)
Electrophysiology Office Note Date: 04/19/2022  ID:  Andrew Vance, DOB 08-25-1935, MRN [id number]  PCP: Laurey Morale, MD Primary Cardiologist: None Electrophysiologist: Cristopher Peru, MD   CC: Routine ICD follow-up  Andrew Vance is a 86 y.o. male seen today for Cristopher Peru, MD for routine electrophysiology followup. Since last being seen in our clinic the patient reports doing very well.  he denies chest pain, palpitations, dyspnea, PND, orthopnea, nausea, vomiting, dizziness, syncope, edema, weight gain, or early satiety.    Device History: St. Jude BiV ICD implanted 2010, BiV upgrade 10/2014 for CHF   Past Medical History:  Diagnosis Date   AICD (automatic cardioverter/defibrillator) present    Anemia, iron deficiency    hx   Arthritis    "all over"   Atrial fibrillation [facility])    sees Dr. Virl Axe   Bronchial pneumonia X 3 "when I was a kid"   Cataract    Complication of anesthesia 1976   BP elevated with spinal;  "I was still in the service when that happened"   GERD (gastroesophageal reflux disease)    GI bleed    hx   Glaucoma    H/O hiatal hernia    Hyperlipidemia    Hypertension    Psoriatic arthritis ([place])    sees Dr. 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 Ardis Hughs, normal    GLAUCOMA SURGERY Bilateral 2020   at [place] hand, fusions to DIPs and PIPs of fingers 1,2,4,and 5   INSERT / REPLACE /  REMOVE PACEMAKER  2010   JOINT REPLACEMENT     PILONIDAL CYST EXCISION  1961   TOTAL HIP ARTHROPLASTY Bilateral 1998 `2001   bilateral, sees Dr. Para March    TOTAL HIP REVISION  02/25/2012   Procedure: TOTAL HIP REVISION;  Surgeon: Kerin Salen, MD;  Location: [place];  Service: Orthopedics;  Laterality: Left;    Current Outpatient Medications  Medication Sig Dispense Refill   acetaminophen (TYLENOL) 325 MG tablet Take 650 mg by mouth 2 (two) times daily as needed (pain).     AMOXICILLIN PO Take 200 mg by mouth. For Dental procedure     carvedilol (COREG) 6.25 MG tablet TAKE 1.5 TABLETS (9.375 MG TOTAL) BY MOUTH 2 (TWO) TIMES DAILY WITH A MEAL. 270 tablet 1   digoxin (LANOXIN) 0.125 MG tablet TAKE 1 TABLET BY MOUTH EVERY DAY 90 tablet 0   fluocinonide (LIDEX) 0.05 % external solution Apply 1 application topically 2 (two) times daily.     hydrocortisone 2.5 % lotion Apply topically 2 (two) times daily.     Lutein 20 MG CAPS Take 20 mg by mouth at bedtime.      rosuvastatin (CRESTOR) 10 MG tablet Take 1 tablet (10 mg total) by mouth daily. 90 tablet 3   triamcinolone cream (KENALOG) 0.1 % Apply 1 application topically 2 (  two) times daily.     warfarin (COUMADIN) 5 MG tablet TAKE 1 TABLET BY MOUTH DAILY OR AS DIRECTED BY COUMADIN CLINIC 100 tablet 1   No current facility-administered medications for this visit.    Allergies:   Zithromax [azithromycin]   Social History: Social History   Socioeconomic History   Marital status: Married    Spouse name: Not on file   Number of children: Not on file   Years of education: Not on file   Highest education level: Not on file  Occupational History   Not on file  Tobacco Use   Smoking status: Former    Packs/day: 1.00    Years: 30.00    Total pack years: 30.00    Types: Cigarettes, Pipe    Quit date: 02/17/1982    Years since quitting: 40.1   Smokeless tobacco: Former    Quit date: 05/01/1983  Vaping Use   Vaping Use: Never used   Substance and Sexual Activity   Alcohol use: Yes    Alcohol/week: 3.0 standard drinks of alcohol    Types: 2 Shots of liquor, 1 Standard drinks or equivalent per week   Drug use: No   Sexual activity: Yes  Other Topics Concern   Not on file  Social History Narrative   Not on file   Social Determinants of Health   Financial Resource Strain: Not on file  Food Insecurity: Not on file  Transportation Needs: Not on file  Physical Activity: Not on file  Stress: Not on file  Social Connections: Not on file  Intimate Partner Violence: Not on file    Family History: Family History  Problem Relation Age of Onset   Hypertension Other    Heart disease Other     Review of Systems: All other systems reviewed and are otherwise negative except as noted above.   Physical Exam: Vitals:   04/19/22 0921  BP: (!) 146/78  Pulse: 70  SpO2: 97%  Weight: 178 lb 9.6 oz (81 kg)  Height: '5\' 8"'$  (1.727 m)     GEN- The patient is well appearing, alert and oriented x 3 today.   HEENT: normocephalic, atraumatic; sclera clear, conjunctiva pink; hearing intact; oropharynx clear; neck supple, no JVP Lymph- no cervical lymphadenopathy Lungs- Clear to ausculation bilaterally, normal work of breathing.  No wheezes, rales, rhonchi Heart- Regular  rate and rhythm ( V paced), no murmurs, rubs or gallops, PMI not laterally displaced GI- soft, non-tender, non-distended, bowel sounds present, no hepatosplenomegaly Extremities- no clubbing or cyanosis. No peripheral edema; DP/PT/radial pulses 2+ bilaterally MS- no significant deformity or atrophy Skin- warm and dry, no rash or lesion; ICD pocket well healed Psych- euthymic mood, full affect Neuro- strength and sensation are intact  ICD interrogation- reviewed in detail today,  See PACEART report  EKG:  EKG is ordered today. Personal review of EKG ordered today shows likely AF with V pacing at 70 bpm  Recent Labs: 04/05/2022: ALT 18; BUN 19;  Creatinine, Ser 0.96; Hemoglobin 16.3; Platelets 171.0; Potassium 4.6; Sodium 142; TSH 2.20   Wt Readings from Last 3 Encounters:  04/19/22 178 lb 9.6 oz (81 kg)  04/05/22 176 lb 8 oz (80.1 kg)  04/05/21 177 lb 2 oz (80.3 kg)     Other studies Reviewed: Additional studies/ records that were reviewed today include: Previous EP office notes.    Assessment and Plan:  1.  Chronic systolic dysfunction s/p St. Jude CRT-D  euvolemic today Stable on an appropriate medical  regimen Normal ICD function See Claudia Desanctis Art report No changes today Echo 2016 showed EF 25-30%, prior to [place] upgrade  2. Atrial fibrillation, Permanent EKG today shows rate controlled AF Continue coumadin  Current medicines are reviewed at length with the patient today.    Labs/ tests ordered today include:  No orders of the defined types were placed in this encounter.   Disposition:   Follow up with Dr. Lovena Le in 12 months   Signed, Shirley Friar, PA-C  04/19/2022 9:26 AM  [facility] [address] [place] [address] [phone] (office) [phone] (fax)

## 2022-04-19 ENCOUNTER — Ambulatory Visit: Payer: PPO | Attending: Student | Admitting: Student

## 2022-04-19 ENCOUNTER — Encounter: Payer: Self-pay | Admitting: Student

## 2022-04-19 VITALS — BP 146/78 | HR 70 | Ht 68.0 in | Wt 178.6 lb

## 2022-04-19 DIAGNOSIS — I5022 Chronic systolic (congestive) heart failure: Secondary | ICD-10-CM

## 2022-04-19 DIAGNOSIS — I4891 Unspecified atrial fibrillation: Secondary | ICD-10-CM

## 2022-04-19 DIAGNOSIS — I495 Sick sinus syndrome: Secondary | ICD-10-CM

## 2022-04-19 LAB — CUP PACEART INCLINIC DEVICE CHECK
Battery Remaining Longevity: 18 mo
Brady Statistic RA Percent Paced: 0 %
Brady Statistic RV Percent Paced: 75 %
Date Time Interrogation Session: 20231221095053
HighPow Impedance: 68.625
Implantable Lead Connection Status: 753985
Implantable Lead Connection Status: 753985
Implantable Lead Connection Status: 753985
Implantable Lead Implant Date: 20100521
Implantable Lead Implant Date: 20160721
Implantable Lead Implant Date: 20160721
Implantable Lead Location: 753858
Implantable Lead Location: 753859
Implantable Lead Location: 753860
Implantable Lead Model: 7122
Implantable Pulse Generator Implant Date: 20160721
Lead Channel Impedance Value: 387.5 Ohm
Lead Channel Impedance Value: 587.5 Ohm
Lead Channel Impedance Value: 675 Ohm
Lead Channel Pacing Threshold Amplitude: 1 V
Lead Channel Pacing Threshold Amplitude: 1 V
Lead Channel Pacing Threshold Amplitude: 1 V
Lead Channel Pacing Threshold Amplitude: 1 V
Lead Channel Pacing Threshold Pulse Width: 0.5 ms
Lead Channel Pacing Threshold Pulse Width: 0.5 ms
Lead Channel Pacing Threshold Pulse Width: 0.8 ms
Lead Channel Pacing Threshold Pulse Width: 0.8 ms
Lead Channel Sensing Intrinsic Amplitude: 7.4 mV
Lead Channel Setting Pacing Amplitude: 2 V
Lead Channel Setting Pacing Amplitude: 2 V
Lead Channel Setting Pacing Pulse Width: 0.5 ms
Lead Channel Setting Pacing Pulse Width: 0.8 ms
Lead Channel Setting Sensing Sensitivity: 0.5 mV
Pulse Gen Serial Number: 7289863
Zone Setting Status: 755011

## 2022-04-19 NOTE — Patient Instructions (Signed)
Medication Instructions:  Your physician recommends that you continue on your current medications as directed. Please refer to the Current Medication list given to you today.  *If you need a refill on your cardiac medications before your next appointment, please call your pharmacy*   Lab Work: None If you have labs (blood work) drawn today and your tests are completely normal, you will receive your results only by: MyChart Message (if you have MyChart) OR A paper copy in the mail If you have any lab test that is abnormal or we need to change your treatment, we will call you to review the results.    Follow-Up: At [facility], you and your health needs are our priority.  As part of our continuing mission to provide you with exceptional heart care, we have created designated Provider Care Teams.  These Care Teams include your primary Cardiologist (physician) and Advanced Practice Providers (APPs -  Physician Assistants and Nurse Practitioners) who all work together to provide you with the care you need, when you need it.   Your next appointment:   1 year(s)  The format for your next appointment:   In Person  Provider:    Gregg Taylor, MD    Important Information About Sugar       

## 2022-05-09 ENCOUNTER — Ambulatory Visit: Payer: PPO | Attending: Cardiology

## 2022-05-09 DIAGNOSIS — Z5181 Encounter for therapeutic drug level monitoring: Secondary | ICD-10-CM

## 2022-05-09 DIAGNOSIS — I4891 Unspecified atrial fibrillation: Secondary | ICD-10-CM | POA: Diagnosis not present

## 2022-05-09 LAB — POCT INR: INR: 2.4 (ref 2.0–3.0)

## 2022-05-09 NOTE — Patient Instructions (Signed)
Description   Continue taking Warfarin 1 tablet ('5mg'$ ) daily except 1/2 tablet on Sundays.  Recheck INR in 7 weeks.  Coumadin Clinic [phone]

## 2022-06-04 ENCOUNTER — Ambulatory Visit: Payer: PPO

## 2022-06-04 DIAGNOSIS — I495 Sick sinus syndrome: Secondary | ICD-10-CM

## 2022-06-04 DIAGNOSIS — I4891 Unspecified atrial fibrillation: Secondary | ICD-10-CM

## 2022-06-05 LAB — CUP PACEART REMOTE DEVICE CHECK
Battery Remaining Longevity: 18 mo
Battery Remaining Percentage: 20 %
Battery Voltage: 2.77 V
Date Time Interrogation Session: 20240205101528
HighPow Impedance: 65 Ohm
HighPow Impedance: 65 Ohm
Implantable Lead Connection Status: 753985
Implantable Lead Connection Status: 753985
Implantable Lead Connection Status: 753985
Implantable Lead Implant Date: 20100521
Implantable Lead Implant Date: 20160721
Implantable Lead Implant Date: 20160721
Implantable Lead Location: 753858
Implantable Lead Location: 753859
Implantable Lead Location: 753860
Implantable Lead Model: 7122
Implantable Pulse Generator Implant Date: 20160721
Lead Channel Impedance Value: 560 Ohm
Lead Channel Impedance Value: 690 Ohm
Lead Channel Pacing Threshold Amplitude: 1 V
Lead Channel Pacing Threshold Amplitude: 1 V
Lead Channel Pacing Threshold Pulse Width: 0.5 ms
Lead Channel Pacing Threshold Pulse Width: 0.8 ms
Lead Channel Sensing Intrinsic Amplitude: 9.9 mV
Lead Channel Setting Pacing Amplitude: 2 V
Lead Channel Setting Pacing Amplitude: 2 V
Lead Channel Setting Pacing Pulse Width: 0.5 ms
Lead Channel Setting Pacing Pulse Width: 0.8 ms
Lead Channel Setting Sensing Sensitivity: 0.5 mV
Pulse Gen Serial Number: 7289863
Zone Setting Status: 755011

## 2022-06-27 ENCOUNTER — Ambulatory Visit: Payer: PPO | Attending: Cardiovascular Disease | Admitting: *Deleted

## 2022-06-27 DIAGNOSIS — I4891 Unspecified atrial fibrillation: Secondary | ICD-10-CM

## 2022-06-27 DIAGNOSIS — Z5181 Encounter for therapeutic drug level monitoring: Secondary | ICD-10-CM | POA: Diagnosis not present

## 2022-06-27 LAB — POCT INR: INR: 4 — AB (ref 2.0–3.0)

## 2022-06-27 NOTE — Patient Instructions (Addendum)
Description   Do not take any warfarin tomorrow then continue taking Warfarin 1 tablet ('5mg'$ ) daily except 1/2 tablet on Sundays. Recheck INR in 3 weeks (normally 6 weeks). Coumadin Clinic [phone]

## 2022-07-09 ENCOUNTER — Other Ambulatory Visit: Payer: Self-pay | Admitting: Internal Medicine

## 2022-07-09 DIAGNOSIS — I4891 Unspecified atrial fibrillation: Secondary | ICD-10-CM

## 2022-07-18 ENCOUNTER — Ambulatory Visit: Payer: PPO | Attending: Cardiology | Admitting: *Deleted

## 2022-07-18 DIAGNOSIS — I4891 Unspecified atrial fibrillation: Secondary | ICD-10-CM

## 2022-07-18 DIAGNOSIS — Z5181 Encounter for therapeutic drug level monitoring: Secondary | ICD-10-CM

## 2022-07-18 LAB — POCT INR: INR: 3.4 — AB (ref 2.0–3.0)

## 2022-07-18 NOTE — Patient Instructions (Signed)
Description   Do not take any warfarin tomorrow then START taking Warfarin 1 tablet (5mg ) daily except 1/2 tablet on Sundays and Thursdays. Recheck INR in 3 weeks (normally 6 weeks). Coumadin Clinic [phone]

## 2022-07-23 NOTE — Progress Notes (Signed)
Remote ICD transmission.   

## 2022-08-08 ENCOUNTER — Ambulatory Visit: Payer: PPO | Attending: Internal Medicine

## 2022-08-08 DIAGNOSIS — H353131 Nonexudative age-related macular degeneration, bilateral, early dry stage: Secondary | ICD-10-CM | POA: Diagnosis not present

## 2022-08-08 DIAGNOSIS — I4891 Unspecified atrial fibrillation: Secondary | ICD-10-CM

## 2022-08-08 DIAGNOSIS — H401133 Primary open-angle glaucoma, bilateral, severe stage: Secondary | ICD-10-CM | POA: Diagnosis not present

## 2022-08-08 DIAGNOSIS — Z5181 Encounter for therapeutic drug level monitoring: Secondary | ICD-10-CM

## 2022-08-08 DIAGNOSIS — Z961 Presence of intraocular lens: Secondary | ICD-10-CM | POA: Diagnosis not present

## 2022-08-08 DIAGNOSIS — H524 Presbyopia: Secondary | ICD-10-CM | POA: Diagnosis not present

## 2022-08-08 LAB — POCT INR: INR: 2.2 (ref 2.0–3.0)

## 2022-08-08 NOTE — Patient Instructions (Signed)
Description   Continue taking Warfarin 1 tablet (5mg ) daily except 1/2 tablet on Sundays and Thursdays. Recheck INR in 4 weeks (normally 6 weeks). Coumadin Clinic [phone]

## 2022-09-03 ENCOUNTER — Ambulatory Visit (INDEPENDENT_AMBULATORY_CARE_PROVIDER_SITE_OTHER): Payer: PPO

## 2022-09-03 DIAGNOSIS — I4891 Unspecified atrial fibrillation: Secondary | ICD-10-CM | POA: Diagnosis not present

## 2022-09-03 LAB — CUP PACEART REMOTE DEVICE CHECK
Battery Remaining Longevity: 12 mo
Battery Remaining Percentage: 13 %
Battery Voltage: 2.71 V
Date Time Interrogation Session: 20240506092500
HighPow Impedance: 71 Ohm
HighPow Impedance: 71 Ohm
Implantable Lead Connection Status: 753985
Implantable Lead Connection Status: 753985
Implantable Lead Connection Status: 753985
Implantable Lead Implant Date: 20100521
Implantable Lead Implant Date: 20160721
Implantable Lead Implant Date: 20160721
Implantable Lead Location: 753858
Implantable Lead Location: 753859
Implantable Lead Location: 753860
Implantable Lead Model: 7122
Implantable Pulse Generator Implant Date: 20160721
Lead Channel Impedance Value: 540 Ohm
Lead Channel Impedance Value: 640 Ohm
Lead Channel Pacing Threshold Amplitude: 1 V
Lead Channel Pacing Threshold Amplitude: 1 V
Lead Channel Pacing Threshold Pulse Width: 0.5 ms
Lead Channel Pacing Threshold Pulse Width: 0.8 ms
Lead Channel Sensing Intrinsic Amplitude: 8.5 mV
Lead Channel Setting Pacing Amplitude: 2 V
Lead Channel Setting Pacing Amplitude: 2 V
Lead Channel Setting Pacing Pulse Width: 0.5 ms
Lead Channel Setting Pacing Pulse Width: 0.8 ms
Lead Channel Setting Sensing Sensitivity: 0.5 mV
Pulse Gen Serial Number: 7289863
Zone Setting Status: 755011

## 2022-09-03 IMAGING — CT CT ABD-PELV W/ CM
2 of 5 series · 15 of 46 positions shown, 17 images · IV contrast (omnipaque)
Comparison: None.

CLINICAL DATA: Abdominal distension.

EXAM:
CT ABDOMEN AND PELVIS WITH CONTRAST
TECHNIQUE: Multidetector CT imaging of the abdomen and pelvis was performed
using the standard protocol following bolus administration of
intravenous contrast.
CONTRAST:  100mL OMNIPAQUE IOHEXOL 300 MG/ML  SOLN

[Series 2: axial st · axial · 0.74mm/px · z∈[-789,-354]mm · 12 of 99 slices shown, 14 images]
[im 6/99  soft-tissue]
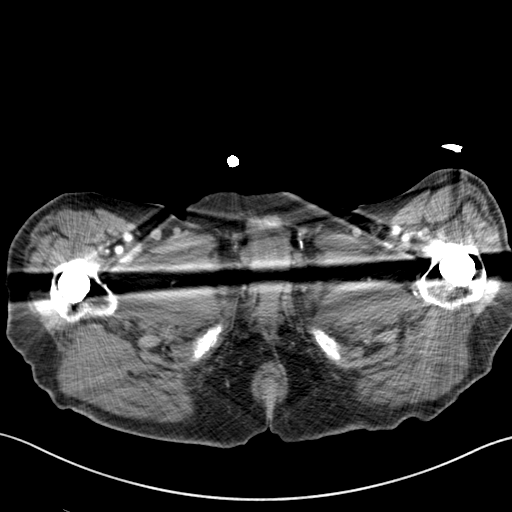
[im 6/99  bone]
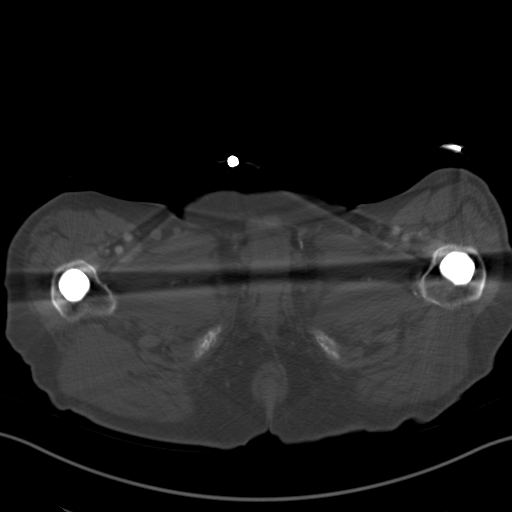
[im 18/99  soft-tissue]
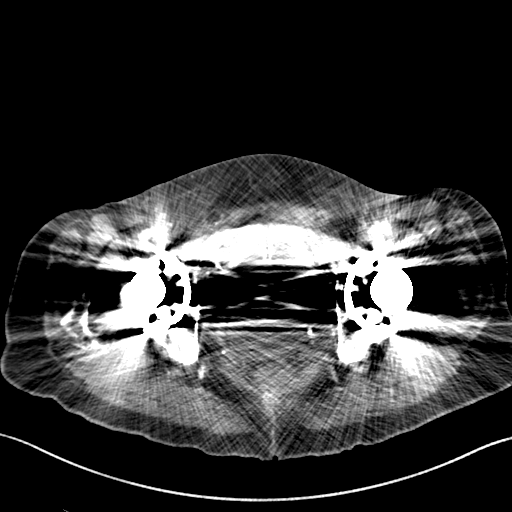
[im 24/99  soft-tissue]
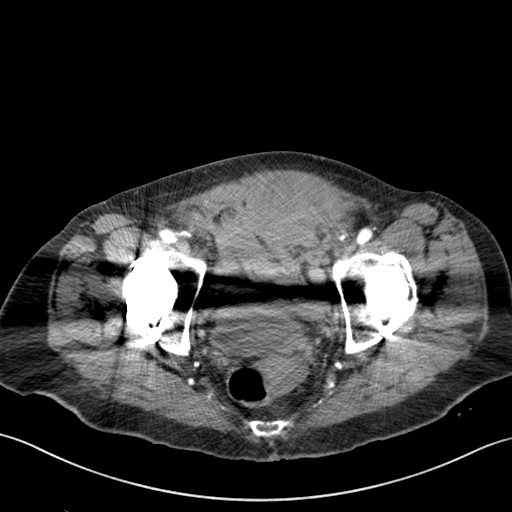
[im 29/99  soft-tissue]
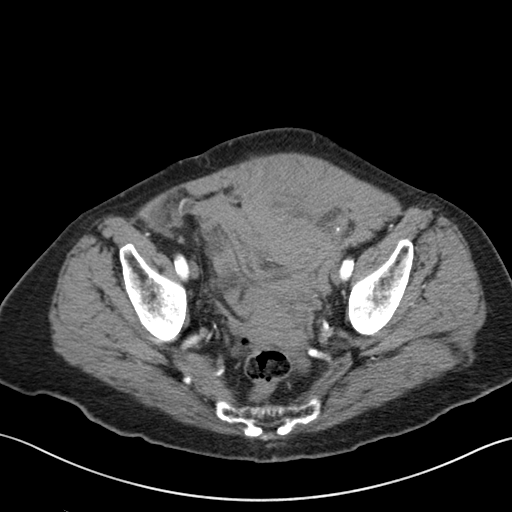
[im 41/99  soft-tissue]
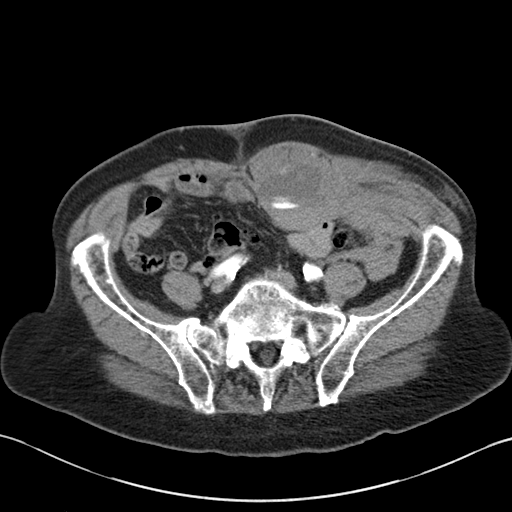
[im 47/99  soft-tissue]
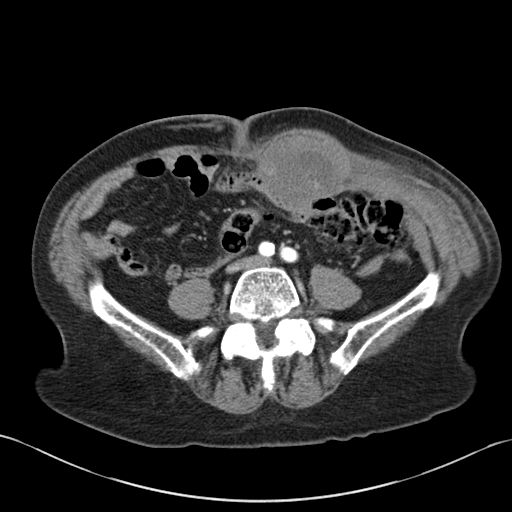
[im 52/99  soft-tissue]
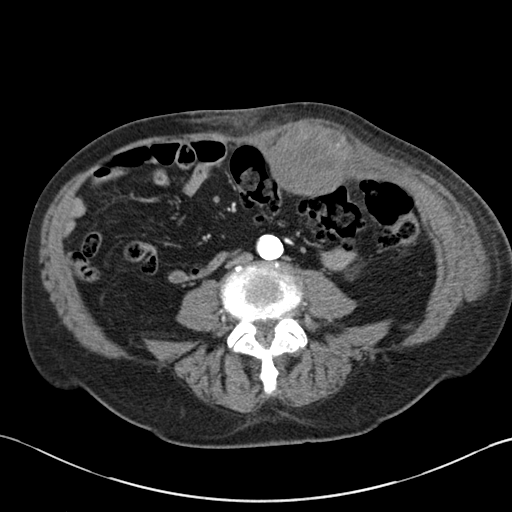
[im 64/99  soft-tissue]
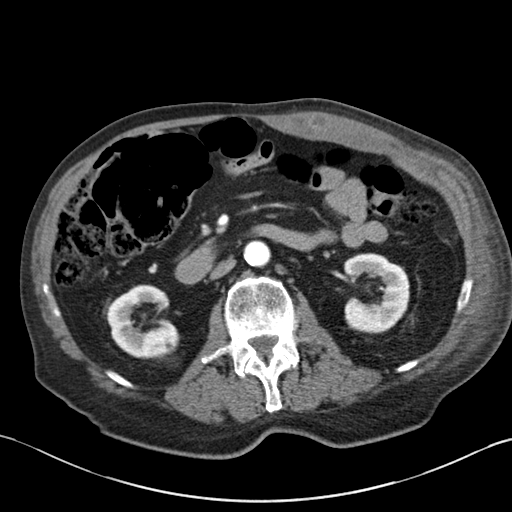
[im 70/99  soft-tissue]
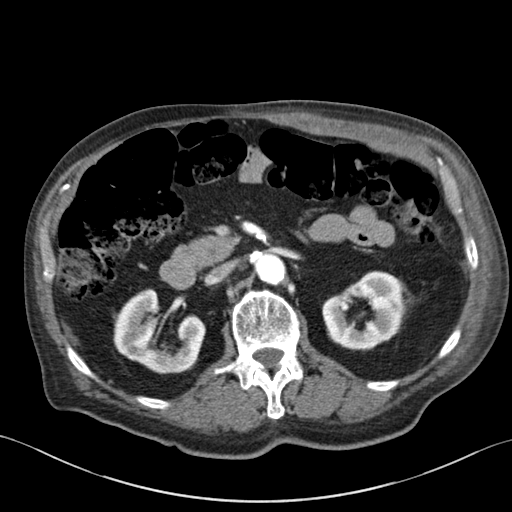
[im 70/99  bone]
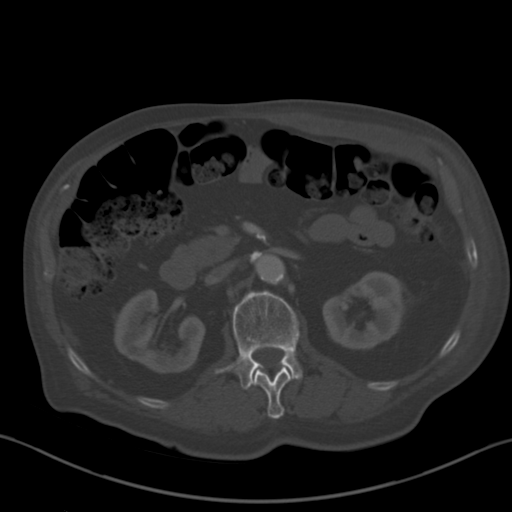
[im 75/99  soft-tissue]
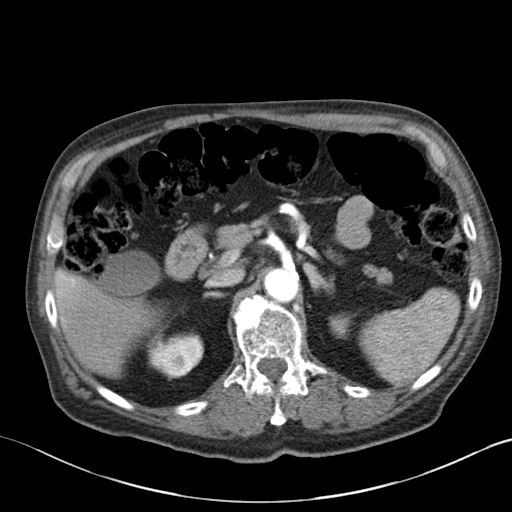
[im 87/99  soft-tissue]
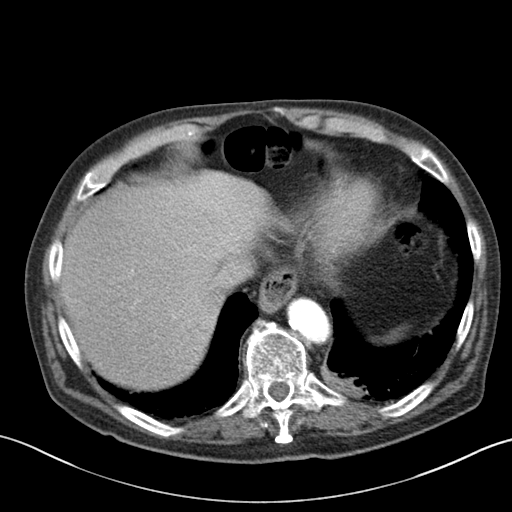
[im 93/99  soft-tissue]
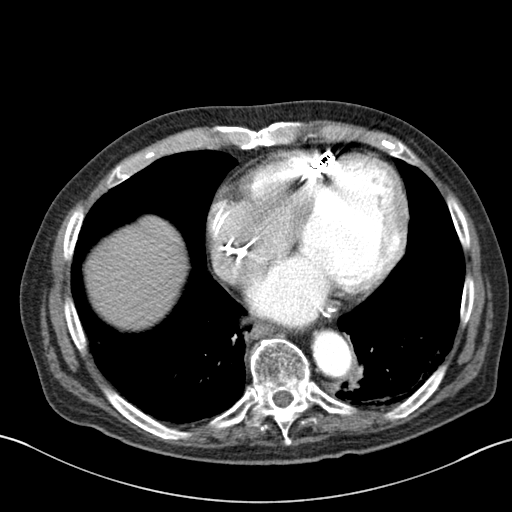

[Series 5: coronal st · coronal · 0.77mm/px · 3 of 140 slices shown]
[im 47/140  soft-tissue]
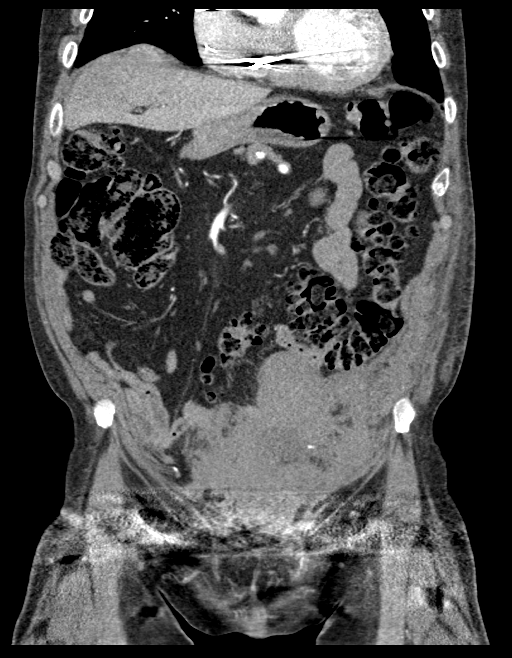
[im 62/140  soft-tissue]
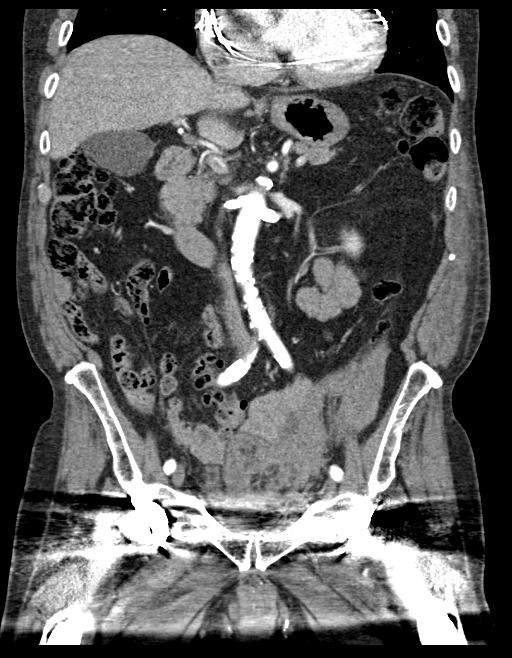
[im 78/140  soft-tissue]
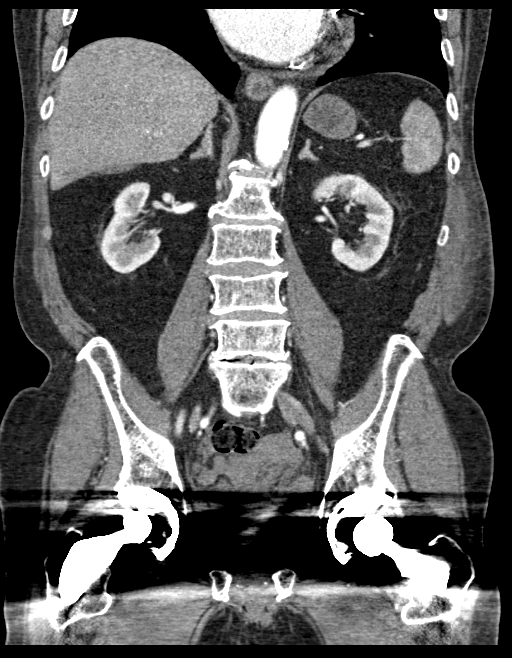

[15 of 46 positions shown; findings below may reference images not displayed]

FINDINGS: Lower chest: There is atelectasis at the lung bases, left greater
than right.The heart is enlarged.

Hepatobiliary: The liver is normal. Normal gallbladder.There is no
biliary ductal dilation.

Pancreas: Normal contours without ductal dilatation. No
peripancreatic fluid collection.

Spleen: Unremarkable.

Adrenals/Urinary Tract:

--Adrenal glands: Unremarkable.

--Right kidney/ureter: There is a nonobstructing stone in the
interpolar region of the right kidney measuring approximately 4 mm.
There is a cyst in the interpolar region with a thin peripheral
calcification.

--Left kidney/ureter: No hydronephrosis or radiopaque kidney stones.

--Urinary bladder: The urinary bladder is poorly evaluated secondary
to extensive streak artifact through the patient's pelvis.

Stomach/Bowel:

--Stomach/Duodenum: There is a small hiatal hernia.

--Small bowel: Unremarkable.

--Colon: There are colonic diverticula, greatest at the level of the
sigmoid colon, without CT evidence for diverticulitis.

--Appendix: Not visualized. No right lower quadrant inflammation or
free fluid.

Vascular/Lymphatic: Atherosclerotic calcification is present within
the non-aneurysmal abdominal aorta, without hemodynamically
significant stenosis.

--No retroperitoneal lymphadenopathy.

--No mesenteric lymphadenopathy.

--No pelvic or inguinal lymphadenopathy.

Reproductive: Unremarkable

Other: No ascites or free air. There is a large left rectus sheath
hematoma measuring approximately 7.1 by 5.9 by 21 cm. This hematoma
has ruptured into the preperitoneal space and pelvis. There is
evidence for areas of active extravasation within the hematoma.

Musculoskeletal. The patient is status post prior bilateral total
hip arthroplasty. There is no acute displaced fracture.
IMPRESSION: 1. Large left rectus sheath hematoma with evidence for active
extravasation within the hematoma.
2. Colonic diverticulosis without CT evidence for diverticulitis.
3. There is a nonobstructing right-sided nephroliths as detailed
above.
4. Cardiomegaly.

Aortic Atherosclerosis ([id number]-[id number].N).

These results were called by telephone at the time of interpretation
on 01/06/2020 at [DATE] to provider LA LINDA LIGHTNER , who verbally
acknowledged these results.

## 2022-09-05 ENCOUNTER — Ambulatory Visit: Payer: PPO | Attending: Cardiology

## 2022-09-05 DIAGNOSIS — I4891 Unspecified atrial fibrillation: Secondary | ICD-10-CM | POA: Diagnosis not present

## 2022-09-05 DIAGNOSIS — Z5181 Encounter for therapeutic drug level monitoring: Secondary | ICD-10-CM | POA: Diagnosis not present

## 2022-09-05 LAB — POCT INR: INR: 2.1 (ref 2.0–3.0)

## 2022-09-05 NOTE — Patient Instructions (Signed)
Description   Continue taking Warfarin 1 tablet (5mg ) daily except 1/2 tablet on Sundays and Thursdays. Recheck INR in 5 weeks (normally 6 weeks). [facility] [phone]

## 2022-09-07 IMAGING — DX DG ABD PORTABLE 1V
2 series · 2 of 2 positions shown · non-contrast
Comparison: CT dated 01/06/2020

CLINICAL DATA: Constipation.  Abdominal pain.

EXAM:
X-RAY ABDOMEN 1 VIEW

[abdomen kub (1 of 2)]
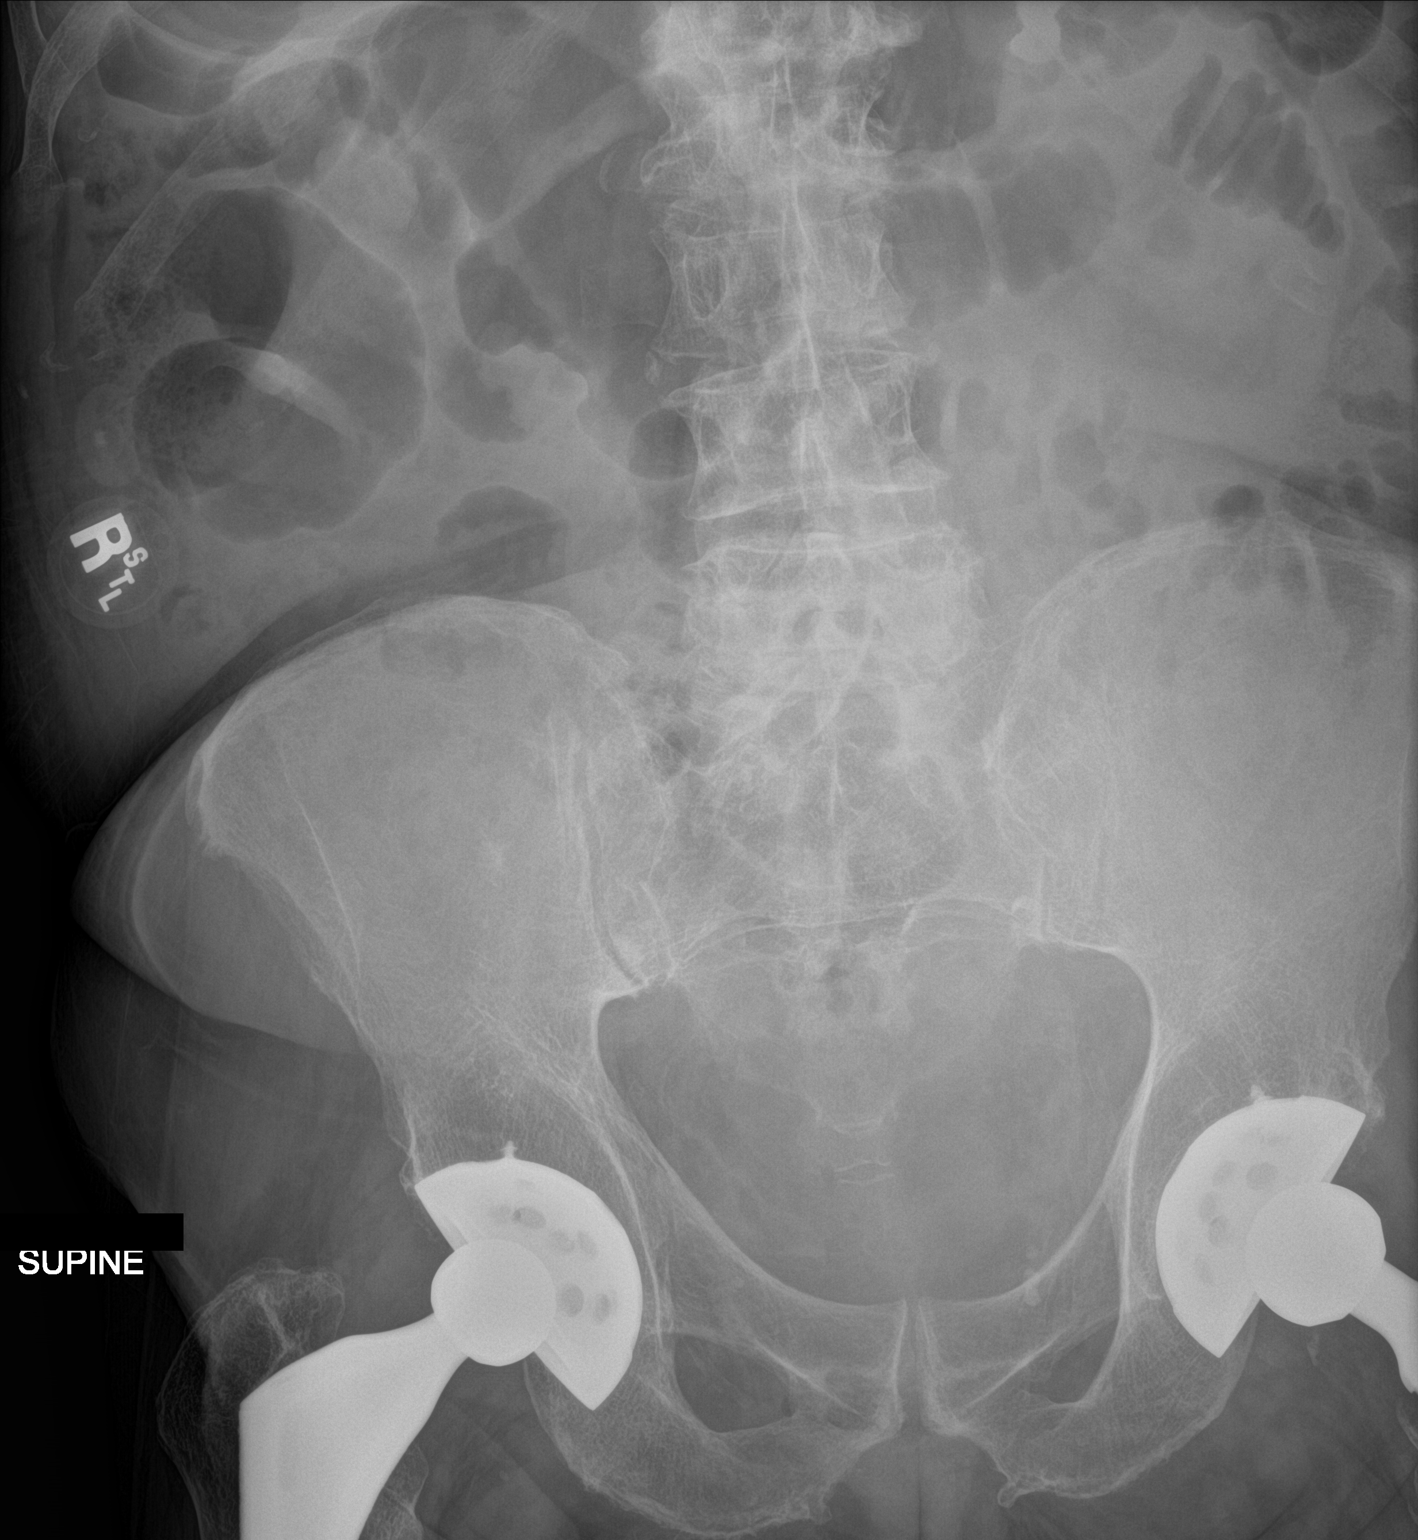

[abdomen kub (2 of 2)]
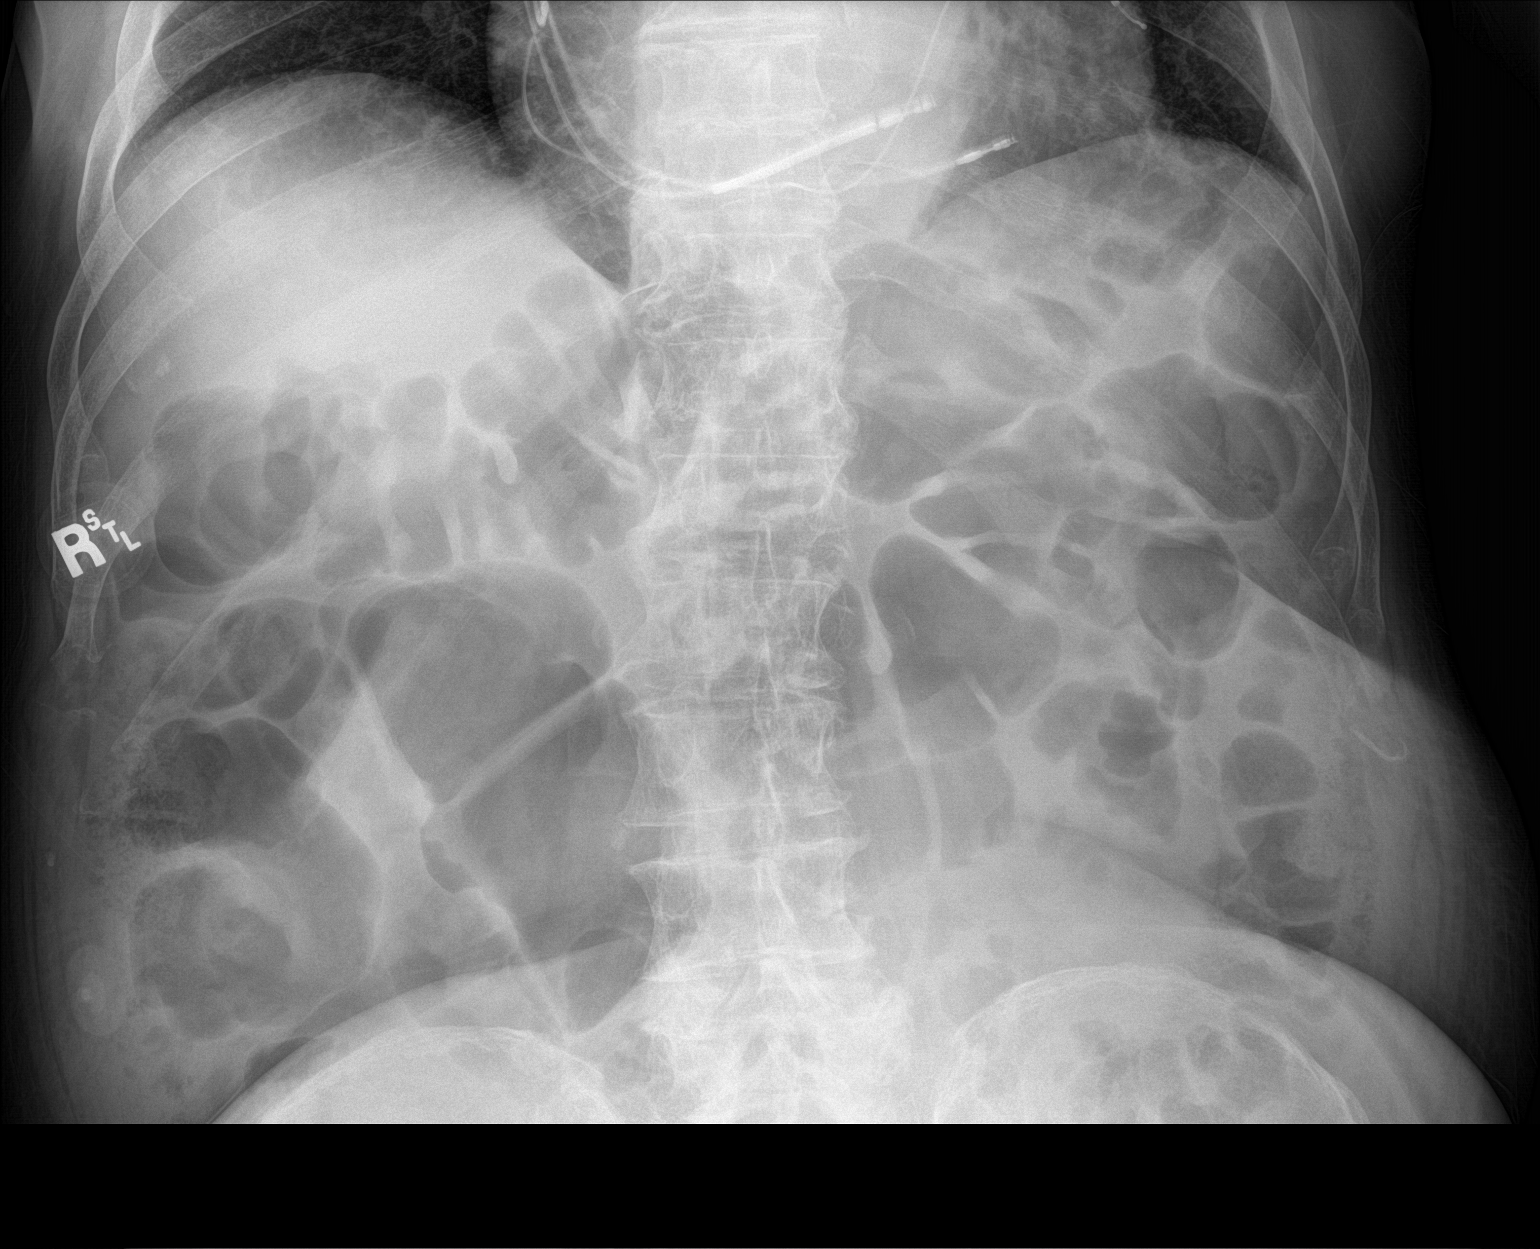

[2 of 2 positions shown; findings below may reference images not displayed]

FINDINGS: There is nonspecific gaseous distention of loops of small bowel and
colon scattered throughout the abdomen. The bowel gas pattern is
otherwise nonobstructive. There is a moderate amount of stool in the
ascending colon. There are degenerative changes of the lumbar spine.
No pneumatosis or free air. The patient is status post bilateral
total hip arthroplasty.
IMPRESSION: Nonspecific gaseous distention of loops of small bowel and colon
scattered throughout the abdomen.

## 2022-09-18 DIAGNOSIS — C44622 Squamous cell carcinoma of skin of right upper limb, including shoulder: Secondary | ICD-10-CM | POA: Diagnosis not present

## 2022-09-18 DIAGNOSIS — L570 Actinic keratosis: Secondary | ICD-10-CM | POA: Diagnosis not present

## 2022-09-18 DIAGNOSIS — L309 Dermatitis, unspecified: Secondary | ICD-10-CM | POA: Diagnosis not present

## 2022-09-18 DIAGNOSIS — Z85828 Personal history of other malignant neoplasm of skin: Secondary | ICD-10-CM | POA: Diagnosis not present

## 2022-09-18 DIAGNOSIS — L821 Other seborrheic keratosis: Secondary | ICD-10-CM | POA: Diagnosis not present

## 2022-09-18 DIAGNOSIS — D485 Neoplasm of uncertain behavior of skin: Secondary | ICD-10-CM | POA: Diagnosis not present

## 2022-10-03 NOTE — Progress Notes (Signed)
Remote ICD transmission.   

## 2022-10-05 ENCOUNTER — Other Ambulatory Visit: Payer: Self-pay | Admitting: Internal Medicine

## 2022-10-10 ENCOUNTER — Ambulatory Visit: Payer: PPO | Attending: Internal Medicine | Admitting: *Deleted

## 2022-10-10 DIAGNOSIS — Z5181 Encounter for therapeutic drug level monitoring: Secondary | ICD-10-CM

## 2022-10-10 DIAGNOSIS — I4891 Unspecified atrial fibrillation: Secondary | ICD-10-CM | POA: Diagnosis not present

## 2022-10-10 LAB — POCT INR: INR: 3 (ref 2.0–3.0)

## 2022-10-10 NOTE — Patient Instructions (Signed)
Description   Continue taking Warfarin 1 tablet (5mg ) daily except 1/2 tablet on Sundays and Thursdays. Have something leafy green today and remain consistent. Recheck INR in 6 weeks. [facility] [phone]

## 2022-10-16 ENCOUNTER — Other Ambulatory Visit: Payer: Self-pay | Admitting: Cardiovascular Disease

## 2022-10-16 DIAGNOSIS — I4891 Unspecified atrial fibrillation: Secondary | ICD-10-CM

## 2022-11-20 DIAGNOSIS — L309 Dermatitis, unspecified: Secondary | ICD-10-CM | POA: Diagnosis not present

## 2022-11-20 DIAGNOSIS — Z85828 Personal history of other malignant neoplasm of skin: Secondary | ICD-10-CM | POA: Diagnosis not present

## 2022-11-21 ENCOUNTER — Ambulatory Visit: Payer: PPO | Attending: Internal Medicine

## 2022-11-21 DIAGNOSIS — I4891 Unspecified atrial fibrillation: Secondary | ICD-10-CM

## 2022-11-21 DIAGNOSIS — Z5181 Encounter for therapeutic drug level monitoring: Secondary | ICD-10-CM | POA: Diagnosis not present

## 2022-11-21 LAB — POCT INR: INR: 3 (ref 2.0–3.0)

## 2022-11-21 NOTE — Patient Instructions (Signed)
Description   Continue taking Warfarin 1 tablet (5mg ) daily except 1/2 tablet on Sundays and Thursdays. Have something leafy green today and remain consistent. Recheck INR in 6 weeks. [facility] [phone]

## 2022-12-03 ENCOUNTER — Ambulatory Visit: Payer: PPO

## 2022-12-03 DIAGNOSIS — I495 Sick sinus syndrome: Secondary | ICD-10-CM

## 2022-12-03 DIAGNOSIS — I4891 Unspecified atrial fibrillation: Secondary | ICD-10-CM

## 2022-12-03 LAB — CUP PACEART REMOTE DEVICE CHECK
Battery Remaining Longevity: 9 mo
Battery Remaining Percentage: 9 %
Battery Voltage: 2.68 V
Date Time Interrogation Session: 20240805120311
HighPow Impedance: 68 Ohm
HighPow Impedance: 68 Ohm
Implantable Lead Connection Status: 753985
Implantable Lead Connection Status: 753985
Implantable Lead Connection Status: 753985
Implantable Lead Implant Date: 20100521
Implantable Lead Implant Date: 20160721
Implantable Lead Implant Date: 20160721
Implantable Lead Location: 753858
Implantable Lead Location: 753859
Implantable Lead Location: 753860
Implantable Lead Model: 7122
Implantable Pulse Generator Implant Date: 20160721
Lead Channel Impedance Value: 580 Ohm
Lead Channel Impedance Value: 650 Ohm
Lead Channel Pacing Threshold Amplitude: 1 V
Lead Channel Pacing Threshold Amplitude: 1 V
Lead Channel Pacing Threshold Pulse Width: 0.5 ms
Lead Channel Pacing Threshold Pulse Width: 0.8 ms
Lead Channel Sensing Intrinsic Amplitude: 10 mV
Lead Channel Setting Pacing Amplitude: 2 V
Lead Channel Setting Pacing Amplitude: 2 V
Lead Channel Setting Pacing Pulse Width: 0.5 ms
Lead Channel Setting Pacing Pulse Width: 0.8 ms
Lead Channel Setting Sensing Sensitivity: 0.5 mV
Pulse Gen Serial Number: 7289863
Zone Setting Status: 755011

## 2022-12-18 NOTE — Progress Notes (Signed)
Remote ICD transmission.   

## 2023-01-02 ENCOUNTER — Ambulatory Visit: Payer: PPO | Attending: Cardiovascular Disease | Admitting: *Deleted

## 2023-01-02 DIAGNOSIS — Z5181 Encounter for therapeutic drug level monitoring: Secondary | ICD-10-CM | POA: Diagnosis not present

## 2023-01-02 DIAGNOSIS — I4891 Unspecified atrial fibrillation: Secondary | ICD-10-CM

## 2023-01-02 LAB — POCT INR: INR: 2.8 (ref 2.0–3.0)

## 2023-01-02 NOTE — Patient Instructions (Addendum)
Description   Continue taking Warfarin 1 tablet (5mg ) daily except 1/2 tablet on Sundays and Thursdays. Keep leafy green veggies consistent.  Recheck INR in 6 weeks. [facility] [phone]

## 2023-01-07 ENCOUNTER — Encounter: Payer: Self-pay | Admitting: Family Medicine

## 2023-01-18 ENCOUNTER — Other Ambulatory Visit: Payer: Self-pay | Admitting: Internal Medicine

## 2023-01-22 ENCOUNTER — Other Ambulatory Visit: Payer: Self-pay | Admitting: Cardiovascular Disease

## 2023-01-22 DIAGNOSIS — I4891 Unspecified atrial fibrillation: Secondary | ICD-10-CM

## 2023-02-06 DIAGNOSIS — H353131 Nonexudative age-related macular degeneration, bilateral, early dry stage: Secondary | ICD-10-CM | POA: Diagnosis not present

## 2023-02-06 DIAGNOSIS — H401133 Primary open-angle glaucoma, bilateral, severe stage: Secondary | ICD-10-CM | POA: Diagnosis not present

## 2023-02-13 ENCOUNTER — Ambulatory Visit: Payer: PPO | Attending: Internal Medicine | Admitting: *Deleted

## 2023-02-13 DIAGNOSIS — I4891 Unspecified atrial fibrillation: Secondary | ICD-10-CM | POA: Diagnosis not present

## 2023-02-13 DIAGNOSIS — Z5181 Encounter for therapeutic drug level monitoring: Secondary | ICD-10-CM

## 2023-02-13 LAB — POCT INR: INR: 2.5 (ref 2.0–3.0)

## 2023-02-13 NOTE — Patient Instructions (Signed)
Description   Continue taking Warfarin 1 tablet (5mg ) daily except 1/2 tablet on Sundays and Thursdays. Keep leafy green veggies consistent.  Recheck INR in 7 weeks. [facility] [phone]

## 2023-03-04 ENCOUNTER — Ambulatory Visit (INDEPENDENT_AMBULATORY_CARE_PROVIDER_SITE_OTHER): Payer: PPO

## 2023-03-04 DIAGNOSIS — I4891 Unspecified atrial fibrillation: Secondary | ICD-10-CM

## 2023-03-04 LAB — CUP PACEART REMOTE DEVICE CHECK
Battery Remaining Longevity: 6 mo
Battery Remaining Percentage: 6 %
Battery Voltage: 2.63 V
Date Time Interrogation Session: 20241104093327
HighPow Impedance: 66 Ohm
HighPow Impedance: 66 Ohm
Implantable Lead Connection Status: 753985
Implantable Lead Connection Status: 753985
Implantable Lead Connection Status: 753985
Implantable Lead Implant Date: 20100521
Implantable Lead Implant Date: 20160721
Implantable Lead Implant Date: 20160721
Implantable Lead Location: 753858
Implantable Lead Location: 753859
Implantable Lead Location: 753860
Implantable Lead Model: 7122
Implantable Pulse Generator Implant Date: 20160721
Lead Channel Impedance Value: 590 Ohm
Lead Channel Impedance Value: 680 Ohm
Lead Channel Pacing Threshold Amplitude: 1 V
Lead Channel Pacing Threshold Amplitude: 1 V
Lead Channel Pacing Threshold Pulse Width: 0.5 ms
Lead Channel Pacing Threshold Pulse Width: 0.8 ms
Lead Channel Sensing Intrinsic Amplitude: 7.8 mV
Lead Channel Setting Pacing Amplitude: 2 V
Lead Channel Setting Pacing Amplitude: 2 V
Lead Channel Setting Pacing Pulse Width: 0.5 ms
Lead Channel Setting Pacing Pulse Width: 0.8 ms
Lead Channel Setting Sensing Sensitivity: 0.5 mV
Pulse Gen Serial Number: 7289863
Zone Setting Status: 755011

## 2023-04-01 NOTE — Progress Notes (Signed)
Remote ICD transmission.   

## 2023-04-03 ENCOUNTER — Ambulatory Visit: Payer: PPO | Attending: Cardiovascular Disease

## 2023-04-03 DIAGNOSIS — I4891 Unspecified atrial fibrillation: Secondary | ICD-10-CM

## 2023-04-03 DIAGNOSIS — Z5181 Encounter for therapeutic drug level monitoring: Secondary | ICD-10-CM | POA: Diagnosis not present

## 2023-04-03 LAB — POCT INR: INR: 2 (ref 2.0–3.0)

## 2023-04-03 NOTE — Patient Instructions (Signed)
Description   Take 1.5 tablets today and then continue taking Warfarin 1 tablet (5mg ) daily except 1/2 tablet on Sundays and Thursdays. Keep leafy green veggies consistent.  Recheck INR in 7 weeks. [facility] [phone]

## 2023-04-08 ENCOUNTER — Ambulatory Visit (INDEPENDENT_AMBULATORY_CARE_PROVIDER_SITE_OTHER): Payer: PPO | Admitting: Family Medicine

## 2023-04-08 ENCOUNTER — Encounter: Payer: Self-pay | Admitting: Family Medicine

## 2023-04-08 VITALS — BP 120/80 | HR 70 | Temp 98.1°F | Ht 68.0 in | Wt 174.8 lb

## 2023-04-08 DIAGNOSIS — Z0001 Encounter for general adult medical examination with abnormal findings: Secondary | ICD-10-CM | POA: Diagnosis not present

## 2023-04-08 DIAGNOSIS — Z0000 Encounter for general adult medical examination without abnormal findings: Secondary | ICD-10-CM | POA: Diagnosis not present

## 2023-04-08 DIAGNOSIS — R972 Elevated prostate specific antigen [PSA]: Secondary | ICD-10-CM

## 2023-04-08 DIAGNOSIS — L989 Disorder of the skin and subcutaneous tissue, unspecified: Secondary | ICD-10-CM | POA: Diagnosis not present

## 2023-04-08 DIAGNOSIS — G5621 Lesion of ulnar nerve, right upper limb: Secondary | ICD-10-CM | POA: Insufficient documentation

## 2023-04-08 MED ORDER — ROSUVASTATIN CALCIUM 10 MG PO TABS: 10.0000 mg | ORAL_TABLET | Freq: Every day | ORAL | 3 refills | Status: DC

## 2023-04-08 NOTE — Progress Notes (Signed)
Subjective:    Patient ID: Andrew Vance, male    DOB: 08/12/1935, 86 y.o.   MRN: [id number]  HPI Here for a well exam. His main concerns are arthritis pains in multiple joints for which he takes Tylenol. He does mention a pain in the right elbow that comes and goes. Sometimes this prohibits him from flexing the elbow such that he cannot touch his face. He says today is a "good day". He has founs that applying ice helps this a lot. Lastly he asks [place] to check a lesion on the right temple. This has been present for years, but it has slowly grown larger so that it is now difficult for him to wear a hat. He sees Cardiology regularly, and it sounds like he may need to have the battery in his defibrillator changed sometime soon.    Review of Systems  Constitutional: Negative.   HENT: Negative.    Eyes: Negative.   Respiratory: Negative.    Cardiovascular: Negative.   Gastrointestinal: Negative.   Genitourinary: Negative.   Musculoskeletal:  Positive for arthralgias.  Skin: Negative.   Neurological: Negative.   Psychiatric/Behavioral: Negative.         Objective:   Physical Exam Constitutional:      General: He is not in acute distress.    Appearance: Normal appearance. He is well-developed. He is not diaphoretic.  HENT:     Head: Normocephalic and atraumatic.     Right Ear: External ear normal.     Left Ear: External ear normal.     Nose: Nose normal.     Mouth/Throat:     Pharynx: No oropharyngeal exudate.  Eyes:     General: No scleral icterus.       Right eye: No discharge.        Left eye: No discharge.     Conjunctiva/sclera: Conjunctivae normal.     Pupils: Pupils are equal, round, and reactive to light.  Neck:     Thyroid: No thyromegaly.     Vascular: No JVD.     Trachea: No tracheal deviation.  Cardiovascular:     Rate and Rhythm: Normal rate and regular rhythm.     Pulses: Normal pulses.     Heart sounds: Normal heart sounds. No murmur heard.    No friction  rub. No gallop.  Pulmonary:     Effort: Pulmonary effort is normal. No respiratory distress.     Breath sounds: Normal breath sounds. No wheezing or rales.  Chest:     Chest wall: No tenderness.  Abdominal:     General: Bowel sounds are normal. There is no distension.     Palpations: Abdomen is soft. There is no mass.     Tenderness: There is no abdominal tenderness. There is no guarding or rebound.  Genitourinary:    Penis: Normal. No tenderness.      Testes: Normal.     Prostate: Normal.     Rectum: Normal. Guaiac result negative.  Musculoskeletal:        General: No tenderness. Normal range of motion.     Cervical back: Neck supple.     Comments: He is tender over the ulnar nerve in the right elbow. Extension is full, but he cannot fully flex the elbow due to pain   Lymphadenopathy:     Cervical: No cervical adenopathy.  Skin:    General: Skin is warm and dry.     Coloration: Skin is not pale.  Findings: No erythema or rash.     Comments: There are 2 nodular lesions on the right temple that appear to be seborrheic keratoses   Neurological:     General: No focal deficit present.     Mental Status: He is alert and oriented to person, place, and time.     Cranial Nerves: No cranial nerve deficit.     Motor: No abnormal muscle tone.     Coordination: Coordination normal.     Deep Tendon Reflexes: Reflexes are normal and symmetric. Reflexes normal.  Psychiatric:        Mood and Affect: Mood normal.        Behavior: Behavior normal.        Thought Content: Thought content normal.        Judgment: Judgment normal.           Assessment & Plan:  Well exam. We discussed diet and exercise. Get fasting labs. 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 He will try ice as needed, and he will let us know if this gets worse. The lesions on the right temple appear to be benign, but he will mention this when he sees Dr. Donzetta Starch next month for his dermatologic exam.  Gershon Crane, MD

## 2023-04-09 LAB — BASIC METABOLIC PANEL
BUN: 15 mg/dL (ref 6–23)
CO2: 25 meq/L (ref 19–32)
Calcium: 9.8 mg/dL (ref 8.4–10.5)
Chloride: 103 meq/L (ref 96–112)
Creatinine, Ser: 0.93 mg/dL (ref 0.40–1.50)
GFR: 75.05 mL/min (ref >60.00)
Glucose, Bld: 93 mg/dL (ref 70–99)
Potassium: 4.6 meq/L (ref 3.5–5.1)
Sodium: 140 meq/L (ref 135–145)

## 2023-04-09 LAB — PSA: PSA: 5.03 ng/mL — ABNORMAL HIGH (ref 0.10–4.00)

## 2023-04-09 LAB — CBC WITH DIFFERENTIAL/PLATELET
Basophils Absolute: 0 10*3/uL (ref 0.0–0.1)
Basophils Relative: 0.6 % (ref 0.0–3.0)
Eosinophils Absolute: 0.1 10*3/uL (ref 0.0–0.7)
Eosinophils Relative: 1.6 % (ref 0.0–5.0)
HCT: 51 % (ref 39.0–52.0)
Hemoglobin: 17 g/dL (ref 13.0–17.0)
Lymphocytes Relative: 13.1 % (ref 12.0–46.0)
Lymphs Abs: 1 10*3/uL (ref 0.7–4.0)
MCHC: 33.3 g/dL (ref 30.0–36.0)
MCV: 99.6 fL (ref 78.0–100.0)
Monocytes Absolute: 0.7 10*3/uL (ref 0.1–1.0)
Monocytes Relative: 9.3 % (ref 3.0–12.0)
Neutro Abs: 5.9 10*3/uL (ref 1.4–7.7)
Neutrophils Relative %: 75.4 % (ref 43.0–77.0)
Platelets: 159 10*3/uL (ref 150.0–400.0)
RBC: 5.13 Mil/uL (ref 4.22–5.81)
RDW: 14.6 % (ref 11.5–15.5)
WBC: 7.9 10*3/uL (ref 4.0–10.5)

## 2023-04-09 LAB — LIPID PANEL
Cholesterol: 134 mg/dL (ref 0–200)
HDL: 65.4 mg/dL (ref >39.00)
LDL Cholesterol: 51 mg/dL (ref 0–99)
NonHDL: 68.22
Total CHOL/HDL Ratio: 2
Triglycerides: 88 mg/dL (ref 0.0–149.0)
VLDL: 17.6 mg/dL (ref 0.0–40.0)

## 2023-04-09 LAB — HEPATIC FUNCTION PANEL
ALT: 20 U/L (ref 0–53)
AST: 27 U/L (ref 0–37)
Albumin: 4.5 g/dL (ref 3.5–5.2)
Alkaline Phosphatase: 95 U/L (ref 39–117)
Bilirubin, Direct: 0.3 mg/dL (ref 0.0–0.3)
Total Bilirubin: 1.3 mg/dL — ABNORMAL HIGH (ref 0.2–1.2)
Total Protein: 7.6 g/dL (ref 6.0–8.3)

## 2023-04-09 LAB — TSH: TSH: 2.06 u[IU]/mL (ref 0.35–5.50)

## 2023-04-09 LAB — HEMOGLOBIN A1C: Hgb A1c MFr Bld: 5.8 % (ref 4.6–6.5)

## 2023-04-10 NOTE — Addendum Note (Signed)
Addended by: Sallee Lange A on: 04/10/2023 04:00 PM   Modules accepted: Orders

## 2023-04-16 ENCOUNTER — Ambulatory Visit: Payer: PPO | Attending: Internal Medicine | Admitting: Internal Medicine

## 2023-04-16 ENCOUNTER — Encounter: Payer: Self-pay | Admitting: Internal Medicine

## 2023-04-16 VITALS — BP 124/80 | HR 71 | Ht 68.0 in | Wt 175.6 lb

## 2023-04-16 DIAGNOSIS — I4891 Unspecified atrial fibrillation: Secondary | ICD-10-CM

## 2023-04-16 NOTE — Patient Instructions (Signed)
Medication Instructions:  Your physician recommends that you continue on your current medications as directed. Please refer to the Current Medication list given to you today.  *If you need a refill on your cardiac medications before your next appointment, please call your pharmacy*  Lab Work: None ordered.  If you have labs (blood work) drawn today and your tests are completely normal, you will receive your results only by: MyChart Message (if you have MyChart) OR A paper copy in the mail If you have any lab test that is abnormal or we need to change your treatment, we will call you to review the results.  Testing/Procedures: Will schedule Generator change at [facility]   Follow-Up: At [facility], you and your health needs are our priority.  As part of our continuing mission to provide you with exceptional heart care, we have created designated Provider Care Teams.  These Care Teams include your primary Cardiologist (physician) and Advanced Practice Providers (APPs -  Physician Assistants and Nurse Practitioners) who all work together to provide you with the care you need, when you need it.   Your next appointment:   To be scheduled  The format for your next appointment:   In Person  Provider:   Lewayne Bunting, MD{or one of the following Advanced Practice Providers on your designated Care Team:   Francis Dowse, [place] Casimiro Needle "Mardelle Matte" [place], [place] Earnest Rosier, NP  Remote monitoring is used to monitor your Pacemaker/ ICD from home. This monitoring reduces the number of office visits required to check your device to one time per year. It allows [place] to keep an eye on the functioning of your device to ensure it is working properly.   Important Information About Sugar

## 2023-04-16 NOTE — Progress Notes (Signed)
HPI [name] returns today for followup. He is a pleasant 87 yo man with a h/o atrial fib, symptomatic bradycardia and LBBB. He is s/p biV ICD and  paces 76% of the time. He has class 2 CHF symptoms. His EF was 15-20% initially then 25-30% by repeat echo. He has been on a beta blocker and ACE inhibitor and with initiation of lasix his dyspnea has resolved and his peripheral edema improved.  He denies chest pain.  He remains active working in his yard.  Allergies  Allergen Reactions   Zithromax [Azithromycin] Other (See Comments)    Affects Coumadin levels      Current Outpatient Medications  Medication Sig Dispense Refill   acetaminophen (TYLENOL) 325 MG tablet Take 650 mg by mouth 2 (two) times daily as needed (pain).     AMOXICILLIN PO Take 200 mg by mouth. For Dental procedure     carvedilol (COREG) 6.25 MG tablet TAKE 1.5 TABLETS (9.375 MG TOTAL) BY MOUTH 2 (TWO) TIMES DAILY WITH A MEAL. 270 tablet 1   digoxin (LANOXIN) 0.125 MG tablet TAKE 1 TABLET BY MOUTH EVERY DAY 90 tablet 1   fluocinonide (LIDEX) 0.05 % external solution Apply 1 application topically 2 (two) times daily.     hydrocortisone 2.5 % lotion Apply topically 2 (two) times daily.     Lutein 20 MG CAPS Take 20 mg by mouth at bedtime.      rosuvastatin (CRESTOR) 10 MG tablet Take 1 tablet (10 mg total) by mouth daily. 90 tablet 3   warfarin (COUMADIN) 5 MG tablet TAKE 1 TABLET BY MOUTH DAILY OR AS DIRECTED BY COUMADIN CLINIC 100 tablet 0   No current facility-administered medications for this visit.     Past Medical History:  Diagnosis Date   AICD (automatic cardioverter/defibrillator) present    Anemia, iron deficiency    hx   Arthritis    "all over"   Atrial fibrillation [facility])    sees Dr. Sherryl Manges   Bronchial pneumonia X 3 "when I was a kid"   Cataract    Complication of anesthesia 1976   BP elevated with spinal;  "I was still in the service when that happened"   GERD (gastroesophageal reflux  disease)    GI bleed    hx   Glaucoma    H/O hiatal hernia    Hyperlipidemia    Hypertension    Psoriatic arthritis (HCC)    sees Dr. 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   Past Surgical History:  Procedure Laterality Date   APPENDECTOMY     CARDIAC CATHETERIZATION  09/10/08   CARDIAC DEFIBRILLATOR PLACEMENT  11/18/2014   CATARACT EXTRACTION, BILATERAL  2020   at [facility]    COLONOSCOPY  05-27-10   per Dr, Christella Hartigan, extensive diverticulosis, repeat in 10 yrs    EP IMPLANTABLE DEVICE N/A 11/18/2014   Procedure: BiV ICD Upgrade;  Surgeon: Marinus Maw, MD;  Location: [facility] INVASIVE CV LAB;  Service: Cardiovascular;  Laterality: N/A;   ESOPHAGOGASTRODUODENOSCOPY  05-27-10   per Dr. Christella Hartigan, normal    GLAUCOMA SURGERY Bilateral 2020   at [facility]    HAND SURGERY Left 1978   lft hand, fusions to DIPs and PIPs of fingers 1,2,4,and 5   INSERT / REPLACE / REMOVE PACEMAKER  2010   JOINT REPLACEMENT     PILONIDAL CYST  EXCISION  1961   TOTAL HIP ARTHROPLASTY Bilateral 1998 `2001   bilateral, sees Dr. Wyline Mood    TOTAL HIP REVISION  02/25/2012   Procedure: TOTAL HIP REVISION;  Surgeon: Nestor Lewandowsky, MD;  Location: [facility] OR;  Service: Orthopedics;  Laterality: Left;     Family History  Problem Relation Age of Onset   Hypertension Other    Heart disease Other      Social History   Socioeconomic History   Marital status: Married    Spouse name: Not on file   Number of children: Not on file   Years of education: Not on file   Highest education level: 12th grade  Occupational History   Not on file  Tobacco Use   Smoking status: Former    Current packs/day: 0.00    Average packs/day: 1 pack/day for 30.0 years (30.0 ttl pk-yrs)    Types: Cigarettes, Pipe    Start date: 02/18/1952    Quit date: 02/17/1982    Years since quitting: 41.1   Smokeless tobacco: Former    Quit date: 05/01/1983  Vaping  Use   Vaping status: Never Used  Substance and Sexual Activity   Alcohol use: Yes    Alcohol/week: 3.0 standard drinks of alcohol    Types: 2 Shots of liquor, 1 Standard drinks or equivalent per week   Drug use: No   Sexual activity: Yes  Other Topics Concern   Not on file  Social History Narrative   Not on file   Social Drivers of Health   Financial Resource Strain: Low Risk  (04/04/2023)   Overall Financial Resource Strain (CARDIA)    Difficulty of Paying Living Expenses: Not hard at all  Food Insecurity: No Food Insecurity (04/04/2023)   Hunger Vital Sign    Worried About Running Out of Food in the Last Year: Never true    Ran Out of Food in the Last Year: Never true  Transportation Needs: No Transportation Needs (04/04/2023)   PRAPARE - Administrator, Civil Service (Medical): No    Lack of Transportation (Non-Medical): No  Physical Activity: Unknown (04/04/2023)   Exercise Vital Sign    Days of Exercise per Week: 0 days    Minutes of Exercise per Session: Not on file  Stress: No Stress Concern Present (04/04/2023)   [employer] of Occupational Health - Occupational Stress Questionnaire    Feeling of Stress : Not at all  Social Connections: Socially Isolated (04/04/2023)   Social Connection and Isolation Panel [NHANES]    Frequency of Communication with Friends and Family: Never    Frequency of Social Gatherings with Friends and Family: Never    Attends Religious Services: Never    Database administrator or Organizations: No    Attends Engineer, structural: Not on file    Marital Status: Married  Catering manager Violence: Not on file     BP 124/80   Pulse 71   Ht 5\' 8"  (1.727 m)   Wt 175 lb 9.6 oz (79.7 kg)   SpO2 98%   BMI 26.70 kg/m   Physical Exam:  Well appearing NAD HEENT: Unremarkable Neck:  No JVD, no thyromegally Lymphatics:  No adenopathy Back:  No CVA tenderness Lungs:  Clear with no wheezes HEART:  Regular rate rhythm,  no murmurs, no rubs, no clicks Abd:  soft, positive bowel sounds, no organomegally, no rebound, no guarding Ext:  2 plus pulses, no edema, no cyanosis, no clubbing Skin:  No rashes no nodules Neuro:  CN II through XII intact, motor grossly intact  EKG - atrial fib with ventricular pacing  DEVICE  Normal device function.  See PaceArt for details.   Assess/Plan:  1. Atrial fib - his VR is well controlled. 2. ICD - his St. Jude Biv ICD is working normally.  3. Chronic systolic heart failure - he has class 2 symptoms. He will continue guideline directed medical therapy. 4. Dyslipidemia - he will continue his statin therapy.   Sharlot Gowda Graciemae Delisle,MD

## 2023-04-18 LAB — CUP PACEART INCLINIC DEVICE CHECK
Battery Remaining Longevity: 4 mo
Brady Statistic RA Percent Paced: 0 %
Brady Statistic RV Percent Paced: 76 %
Date Time Interrogation Session: 20241217145200
HighPow Impedance: 66.375
Implantable Lead Connection Status: 753985
Implantable Lead Connection Status: 753985
Implantable Lead Connection Status: 753985
Implantable Lead Implant Date: 20100521
Implantable Lead Implant Date: 20160721
Implantable Lead Implant Date: 20160721
Implantable Lead Location: 753858
Implantable Lead Location: 753859
Implantable Lead Location: 753860
Implantable Lead Model: 7122
Implantable Pulse Generator Implant Date: 20160721
Lead Channel Impedance Value: 375 Ohm
Lead Channel Impedance Value: 537.5 Ohm
Lead Channel Impedance Value: 662.5 Ohm
Lead Channel Pacing Threshold Amplitude: 1 V
Lead Channel Pacing Threshold Amplitude: 1 V
Lead Channel Pacing Threshold Amplitude: 1 V
Lead Channel Pacing Threshold Amplitude: 1 V
Lead Channel Pacing Threshold Pulse Width: 0.5 ms
Lead Channel Pacing Threshold Pulse Width: 0.5 ms
Lead Channel Pacing Threshold Pulse Width: 0.8 ms
Lead Channel Pacing Threshold Pulse Width: 0.8 ms
Lead Channel Sensing Intrinsic Amplitude: 10 mV
Lead Channel Setting Pacing Amplitude: 2 V
Lead Channel Setting Pacing Amplitude: 2 V
Lead Channel Setting Pacing Pulse Width: 0.5 ms
Lead Channel Setting Pacing Pulse Width: 0.8 ms
Lead Channel Setting Sensing Sensitivity: 0.5 mV
Pulse Gen Serial Number: 7289863
Zone Setting Status: 755011

## 2023-05-05 ENCOUNTER — Other Ambulatory Visit: Payer: Self-pay | Admitting: Cardiovascular Disease

## 2023-05-05 DIAGNOSIS — I4891 Unspecified atrial fibrillation: Secondary | ICD-10-CM

## 2023-05-22 ENCOUNTER — Ambulatory Visit: Payer: PPO

## 2023-05-23 ENCOUNTER — Ambulatory Visit: Payer: PPO | Attending: Internal Medicine

## 2023-05-23 DIAGNOSIS — I4891 Unspecified atrial fibrillation: Secondary | ICD-10-CM | POA: Diagnosis not present

## 2023-05-23 DIAGNOSIS — Z5181 Encounter for therapeutic drug level monitoring: Secondary | ICD-10-CM

## 2023-05-23 LAB — POCT INR: INR: 1.7 — AB (ref 2.0–3.0)

## 2023-05-23 NOTE — Patient Instructions (Signed)
Description   Take 1 tablet today and then continue taking Warfarin 1 tablet (5mg ) daily except 1/2 tablet on Sundays and Thursdays.  Keep leafy green veggies consistent.  Recheck INR in 6 weeks.  C[facility]madin Clinic [phone]

## 2023-06-03 ENCOUNTER — Ambulatory Visit (INDEPENDENT_AMBULATORY_CARE_PROVIDER_SITE_OTHER): Payer: PPO

## 2023-06-03 DIAGNOSIS — I4891 Unspecified atrial fibrillation: Secondary | ICD-10-CM

## 2023-06-04 ENCOUNTER — Telehealth: Payer: Self-pay

## 2023-06-04 ENCOUNTER — Encounter: Payer: Self-pay | Admitting: Internal Medicine

## 2023-06-04 LAB — CUP PACEART REMOTE DEVICE CHECK
Battery Remaining Longevity: 1 mo
Battery Remaining Percentage: 3 %
Battery Voltage: 2.6 V
Date Time Interrogation Session: 20250203100123
HighPow Impedance: 65 Ohm
HighPow Impedance: 65 Ohm
Implantable Lead Connection Status: 753985
Implantable Lead Connection Status: 753985
Implantable Lead Connection Status: 753985
Implantable Lead Implant Date: 20100521
Implantable Lead Implant Date: 20160721
Implantable Lead Implant Date: 20160721
Implantable Lead Location: 753858
Implantable Lead Location: 753859
Implantable Lead Location: 753860
Implantable Lead Model: 7122
Implantable Pulse Generator Implant Date: 20160721
Lead Channel Impedance Value: 560 Ohm
Lead Channel Impedance Value: 680 Ohm
Lead Channel Pacing Threshold Amplitude: 1 V
Lead Channel Pacing Threshold Amplitude: 1 V
Lead Channel Pacing Threshold Pulse Width: 0.5 ms
Lead Channel Pacing Threshold Pulse Width: 0.8 ms
Lead Channel Sensing Intrinsic Amplitude: 9 mV
Lead Channel Setting Pacing Amplitude: 2 V
Lead Channel Setting Pacing Amplitude: 2 V
Lead Channel Setting Pacing Pulse Width: 0.5 ms
Lead Channel Setting Pacing Pulse Width: 0.8 ms
Lead Channel Setting Sensing Sensitivity: 0.5 mV
Pulse Gen Serial Number: 7289863
Zone Setting Status: 755011

## 2023-06-04 NOTE — Telephone Encounter (Signed)
Monthly battery checks added in EPIC/Merlin.   Attempted to contact patient to advise dates as his device is manual send. No answer, LMTCB.

## 2023-06-11 NOTE — Telephone Encounter (Signed)
Lvm for patient to call back so that we can give those dates. DC direct number was left

## 2023-06-24 ENCOUNTER — Encounter: Payer: Self-pay | Admitting: Internal Medicine

## 2023-06-25 ENCOUNTER — Telehealth: Payer: Self-pay

## 2023-06-25 NOTE — Telephone Encounter (Signed)
 Transmission received.  Device is RRT.  MyChart message sent to Pt to advise.  Pt with recent office visit with Dr. Ladona Ridgel.  Procedure discussed with Pt.  Ok to schedule gen change without another office visit.  Sending to scheduler to set up gen change.

## 2023-07-04 ENCOUNTER — Ambulatory Visit: Payer: PPO | Attending: Internal Medicine | Admitting: *Deleted

## 2023-07-04 DIAGNOSIS — I4891 Unspecified atrial fibrillation: Secondary | ICD-10-CM | POA: Diagnosis not present

## 2023-07-04 DIAGNOSIS — Z5181 Encounter for therapeutic drug level monitoring: Secondary | ICD-10-CM

## 2023-07-04 LAB — POCT INR: POC INR: 1.7

## 2023-07-04 NOTE — Patient Instructions (Signed)
 Description    Take 1.5 tablets of warfarin today Then START taking warfarin 1 tablet daily except for 1/2 a tablet on Sundays.  Keep leafy green veggies consistent.  Recheck INR in 3 weeks.  Coumadin Clinic [phone]

## 2023-07-07 ENCOUNTER — Other Ambulatory Visit: Payer: Self-pay | Admitting: Internal Medicine

## 2023-07-08 ENCOUNTER — Ambulatory Visit: Payer: PPO

## 2023-07-08 DIAGNOSIS — I4891 Unspecified atrial fibrillation: Secondary | ICD-10-CM | POA: Diagnosis not present

## 2023-07-09 DIAGNOSIS — H43812 Vitreous degeneration, left eye: Secondary | ICD-10-CM | POA: Diagnosis not present

## 2023-07-09 DIAGNOSIS — H401133 Primary open-angle glaucoma, bilateral, severe stage: Secondary | ICD-10-CM | POA: Diagnosis not present

## 2023-07-09 DIAGNOSIS — H5203 Hypermetropia, bilateral: Secondary | ICD-10-CM | POA: Diagnosis not present

## 2023-07-09 LAB — CUP PACEART REMOTE DEVICE CHECK
Battery Remaining Longevity: 0 mo
Battery Voltage: 2.59 V
Date Time Interrogation Session: 20250310142052
HighPow Impedance: 65 Ohm
HighPow Impedance: 65 Ohm
Implantable Lead Connection Status: 753985
Implantable Lead Connection Status: 753985
Implantable Lead Connection Status: 753985
Implantable Lead Implant Date: 20100521
Implantable Lead Implant Date: 20160721
Implantable Lead Implant Date: 20160721
Implantable Lead Location: 753858
Implantable Lead Location: 753859
Implantable Lead Location: 753860
Implantable Lead Model: 7122
Implantable Pulse Generator Implant Date: 20160721
Lead Channel Impedance Value: 510 Ohm
Lead Channel Impedance Value: 700 Ohm
Lead Channel Pacing Threshold Amplitude: 1 V
Lead Channel Pacing Threshold Amplitude: 1 V
Lead Channel Pacing Threshold Pulse Width: 0.5 ms
Lead Channel Pacing Threshold Pulse Width: 0.8 ms
Lead Channel Sensing Intrinsic Amplitude: 7.8 mV
Lead Channel Setting Pacing Amplitude: 2 V
Lead Channel Setting Pacing Amplitude: 2 V
Lead Channel Setting Pacing Pulse Width: 0.5 ms
Lead Channel Setting Pacing Pulse Width: 0.8 ms
Lead Channel Setting Sensing Sensitivity: 0.5 mV
Pulse Gen Serial Number: 7289863
Zone Setting Status: 755011

## 2023-07-10 ENCOUNTER — Encounter: Payer: Self-pay | Admitting: Internal Medicine

## 2023-07-11 ENCOUNTER — Other Ambulatory Visit: Payer: Self-pay | Admitting: *Deleted

## 2023-07-11 ENCOUNTER — Encounter: Payer: Self-pay | Admitting: *Deleted

## 2023-07-11 ENCOUNTER — Telehealth: Payer: Self-pay | Admitting: Internal Medicine

## 2023-07-11 DIAGNOSIS — I4891 Unspecified atrial fibrillation: Secondary | ICD-10-CM

## 2023-07-11 DIAGNOSIS — I495 Sick sinus syndrome: Secondary | ICD-10-CM

## 2023-07-11 DIAGNOSIS — Z79899 Other long term (current) drug therapy: Secondary | ICD-10-CM

## 2023-07-11 NOTE — Addendum Note (Signed)
 Addended by: Geralyn Flash D on: 07/11/2023 10:43 AM   Modules accepted: Orders

## 2023-07-11 NOTE — Telephone Encounter (Signed)
 Spoke with pt. Answered questions about procedure and instructions. Pt stated thanks.

## 2023-07-11 NOTE — Progress Notes (Signed)
 Remote ICD transmission.

## 2023-07-11 NOTE — Telephone Encounter (Signed)
 Pt is scheduled for BiV-ICD Gen Change with Dr. Ladona Ridgel on 4/7 at 7:30 am.

## 2023-07-11 NOTE — Telephone Encounter (Signed)
 Pt called in stating he just received letter with instructions for Ablation, he would like to speak to someone further about it. Please advise.

## 2023-07-12 ENCOUNTER — Other Ambulatory Visit: Payer: PPO

## 2023-07-12 DIAGNOSIS — R972 Elevated prostate specific antigen [PSA]: Secondary | ICD-10-CM

## 2023-07-12 LAB — PSA: PSA: 3.74 ng/mL (ref 0.10–4.00)

## 2023-07-25 ENCOUNTER — Ambulatory Visit: Attending: Cardiology | Admitting: Pharmacist

## 2023-07-25 DIAGNOSIS — Z5181 Encounter for therapeutic drug level monitoring: Secondary | ICD-10-CM

## 2023-07-25 DIAGNOSIS — I4891 Unspecified atrial fibrillation: Secondary | ICD-10-CM

## 2023-07-25 LAB — POCT INR: INR: 2.1 (ref 2.0–3.0)

## 2023-07-25 NOTE — Patient Instructions (Addendum)
 Description   Then START taking warfarin 1 tablet daily except for 1/2 a tablet on Sundays.  Keep leafy green veggies consistent.  Recheck INR in 3 weeks.  Coumadin Clinic [phone]

## 2023-07-26 DIAGNOSIS — I4891 Unspecified atrial fibrillation: Secondary | ICD-10-CM | POA: Diagnosis not present

## 2023-07-26 DIAGNOSIS — I495 Sick sinus syndrome: Secondary | ICD-10-CM | POA: Diagnosis not present

## 2023-07-26 DIAGNOSIS — Z79899 Other long term (current) drug therapy: Secondary | ICD-10-CM | POA: Diagnosis not present

## 2023-07-27 LAB — BASIC METABOLIC PANEL WITH GFR
BUN/Creatinine Ratio: 16 (ref 10–24)
BUN: 14 mg/dL (ref 8–27)
CO2: 22 mmol/L (ref 20–29)
Calcium: 9.7 mg/dL (ref 8.6–10.2)
Chloride: 105 mmol/L (ref 96–106)
Creatinine, Ser: 0.89 mg/dL (ref 0.76–1.27)
Glucose: 110 mg/dL — ABNORMAL HIGH (ref 70–99)
Potassium: 4.8 mmol/L (ref 3.5–5.2)
Sodium: 144 mmol/L (ref 134–144)
eGFR: 84 mL/min/{1.73_m2} (ref >59)

## 2023-07-27 LAB — CBC
Hematocrit: 49.1 % (ref 37.5–51.0)
Hemoglobin: 16.5 g/dL (ref 13.0–17.7)
MCH: 33.2 pg — ABNORMAL HIGH (ref 26.6–33.0)
MCHC: 33.6 g/dL (ref 31.5–35.7)
MCV: 99 fL — ABNORMAL HIGH (ref 79–97)
Platelets: 151 10*3/uL (ref 150–450)
RBC: 4.97 x10E6/uL (ref 4.14–5.80)
RDW: 13.1 % (ref 11.6–15.4)
WBC: 6.1 10*3/uL (ref 3.4–10.8)

## 2023-07-29 ENCOUNTER — Telehealth (HOSPITAL_COMMUNITY): Payer: Self-pay

## 2023-07-29 ENCOUNTER — Telehealth: Payer: Self-pay

## 2023-07-29 NOTE — Telephone Encounter (Signed)
 Call placed to patient to discuss upcoming procedure.   Confirmed patient is scheduled for a ICD generator change on Monday, April 7 with Dr. Lewayne Bunting. Instructed patient to arrive at the Main Entrance A at [facility]: [address] [place], [place] [address] and check in at Admitting at 5:30 AM.   Labs completed  Any recent signs of acute illness or been started on antibiotics?  No Any new medications started? No Medication instructions:  Hold your Coumadin for 2 days prior to your procedure. Your last dose will be Friday, April 4. Do not take any medications on the morning of your procedure.  No eating or drinking after midnight prior to procedure.   The night before your procedure and the morning of your procedure scrub your neck/chest with the CHG surgical soap. Patient states he has psoriasis and plans to use Dial antibacterial soap instead.   Advised of plan to go home the same day and will only stay overnight if medically necessary. You MUST have a responsible adult to drive you home and MUST be with you the first 24 hours after you arrive home.  Patient verbalized understanding to all instructions provided and agreed to proceed with procedure.

## 2023-07-29 NOTE — Telephone Encounter (Signed)
 I spoke to the patient who is having a Generator change 4/7 and will Hold 4/5 and 4/6 and was rescheduled for INR check 4/15.

## 2023-07-30 ENCOUNTER — Other Ambulatory Visit: Payer: Self-pay

## 2023-07-30 DIAGNOSIS — Z01812 Encounter for preprocedural laboratory examination: Secondary | ICD-10-CM

## 2023-08-05 DIAGNOSIS — I4891 Unspecified atrial fibrillation: Secondary | ICD-10-CM

## 2023-08-05 DIAGNOSIS — Z5181 Encounter for therapeutic drug level monitoring: Secondary | ICD-10-CM

## 2023-08-06 ENCOUNTER — Ambulatory Visit

## 2023-08-08 LAB — CUP PACEART REMOTE DEVICE CHECK
Battery Remaining Longevity: 0 mo
Battery Voltage: 2.59 V
Date Time Interrogation Session: 20250410023155
HighPow Impedance: 65 Ohm
HighPow Impedance: 65 Ohm
Implantable Lead Connection Status: 753985
Implantable Lead Connection Status: 753985
Implantable Lead Connection Status: 753985
Implantable Lead Implant Date: 20100521
Implantable Lead Implant Date: 20160721
Implantable Lead Implant Date: 20160721
Implantable Lead Location: 753858
Implantable Lead Location: 753859
Implantable Lead Location: 753860
Implantable Lead Model: 7122
Implantable Pulse Generator Implant Date: 20160721
Lead Channel Impedance Value: 540 Ohm
Lead Channel Impedance Value: 680 Ohm
Lead Channel Pacing Threshold Amplitude: 1 V
Lead Channel Pacing Threshold Amplitude: 1 V
Lead Channel Pacing Threshold Pulse Width: 0.5 ms
Lead Channel Pacing Threshold Pulse Width: 0.8 ms
Lead Channel Sensing Intrinsic Amplitude: 9 mV
Lead Channel Setting Pacing Amplitude: 2 V
Lead Channel Setting Pacing Amplitude: 2 V
Lead Channel Setting Pacing Pulse Width: 0.5 ms
Lead Channel Setting Pacing Pulse Width: 0.8 ms
Lead Channel Setting Sensing Sensitivity: 0.5 mV
Pulse Gen Serial Number: 7289863
Zone Setting Status: 755011

## 2023-08-12 ENCOUNTER — Ambulatory Visit: Payer: PPO

## 2023-08-12 DIAGNOSIS — D1801 Hemangioma of skin and subcutaneous tissue: Secondary | ICD-10-CM | POA: Diagnosis not present

## 2023-08-12 DIAGNOSIS — D485 Neoplasm of uncertain behavior of skin: Secondary | ICD-10-CM | POA: Diagnosis not present

## 2023-08-12 DIAGNOSIS — L570 Actinic keratosis: Secondary | ICD-10-CM | POA: Diagnosis not present

## 2023-08-12 DIAGNOSIS — L812 Freckles: Secondary | ICD-10-CM | POA: Diagnosis not present

## 2023-08-12 DIAGNOSIS — L738 Other specified follicular disorders: Secondary | ICD-10-CM | POA: Diagnosis not present

## 2023-08-12 DIAGNOSIS — D0462 Carcinoma in situ of skin of left upper limb, including shoulder: Secondary | ICD-10-CM | POA: Diagnosis not present

## 2023-08-12 DIAGNOSIS — Z85828 Personal history of other malignant neoplasm of skin: Secondary | ICD-10-CM | POA: Diagnosis not present

## 2023-08-12 DIAGNOSIS — L821 Other seborrheic keratosis: Secondary | ICD-10-CM | POA: Diagnosis not present

## 2023-08-12 DIAGNOSIS — C44629 Squamous cell carcinoma of skin of left upper limb, including shoulder: Secondary | ICD-10-CM | POA: Diagnosis not present

## 2023-08-12 DIAGNOSIS — I4891 Unspecified atrial fibrillation: Secondary | ICD-10-CM

## 2023-08-12 DIAGNOSIS — D045 Carcinoma in situ of skin of trunk: Secondary | ICD-10-CM | POA: Diagnosis not present

## 2023-08-13 ENCOUNTER — Encounter: Payer: Self-pay | Admitting: Internal Medicine

## 2023-08-13 ENCOUNTER — Ambulatory Visit: Attending: Cardiology

## 2023-08-13 DIAGNOSIS — I4891 Unspecified atrial fibrillation: Secondary | ICD-10-CM | POA: Diagnosis not present

## 2023-08-13 DIAGNOSIS — Z5181 Encounter for therapeutic drug level monitoring: Secondary | ICD-10-CM

## 2023-08-13 LAB — POCT INR: INR: 2.6 (ref 2.0–3.0)

## 2023-08-13 NOTE — Patient Instructions (Signed)
 Continue taking warfarin 1 tablet daily except for 1/2 a tablet on Sundays. Generator change 5/20 Keep leafy green veggies consistent.  Recheck INR in 4 weeks.  Coumadin Clinic [phone]

## 2023-08-21 ENCOUNTER — Other Ambulatory Visit: Payer: Self-pay | Admitting: Internal Medicine

## 2023-08-21 DIAGNOSIS — Z79899 Other long term (current) drug therapy: Secondary | ICD-10-CM | POA: Diagnosis not present

## 2023-08-21 DIAGNOSIS — I4891 Unspecified atrial fibrillation: Secondary | ICD-10-CM | POA: Diagnosis not present

## 2023-08-21 DIAGNOSIS — I495 Sick sinus syndrome: Secondary | ICD-10-CM | POA: Diagnosis not present

## 2023-08-21 LAB — CBC

## 2023-08-22 LAB — CBC
Hematocrit: 47.8 % (ref 37.5–51.0)
Hemoglobin: 15.8 g/dL (ref 13.0–17.7)
MCH: 32.6 pg (ref 26.6–33.0)
MCHC: 33.1 g/dL (ref 31.5–35.7)
MCV: 99 fL — ABNORMAL HIGH (ref 79–97)
Platelets: 146 10*3/uL — ABNORMAL LOW (ref 150–450)
RBC: 4.85 x10E6/uL (ref 4.14–5.80)
RDW: 13.1 % (ref 11.6–15.4)
WBC: 5.3 10*3/uL (ref 3.4–10.8)

## 2023-08-22 LAB — BASIC METABOLIC PANEL WITH GFR
BUN/Creatinine Ratio: 22 (ref 10–24)
BUN: 21 mg/dL (ref 8–27)
CO2: 21 mmol/L (ref 20–29)
Calcium: 9.7 mg/dL (ref 8.6–10.2)
Chloride: 105 mmol/L (ref 96–106)
Creatinine, Ser: 0.97 mg/dL (ref 0.76–1.27)
Glucose: 129 mg/dL — ABNORMAL HIGH (ref 70–99)
Potassium: 4.5 mmol/L (ref 3.5–5.2)
Sodium: 142 mmol/L (ref 134–144)
eGFR: 77 mL/min/{1.73_m2} (ref >59)

## 2023-08-26 NOTE — Progress Notes (Signed)
 Remote ICD transmission.

## 2023-08-26 NOTE — Addendum Note (Signed)
 Addended by: Edra Govern D on: 08/26/2023 03:48 PM   Modules accepted: Orders

## 2023-09-02 ENCOUNTER — Ambulatory Visit: Payer: PPO

## 2023-09-10 ENCOUNTER — Telehealth: Payer: Self-pay | Admitting: Internal Medicine

## 2023-09-10 ENCOUNTER — Ambulatory Visit: Attending: Internal Medicine

## 2023-09-10 ENCOUNTER — Telehealth (HOSPITAL_COMMUNITY): Payer: Self-pay

## 2023-09-10 DIAGNOSIS — I4891 Unspecified atrial fibrillation: Secondary | ICD-10-CM | POA: Diagnosis not present

## 2023-09-10 DIAGNOSIS — Z5181 Encounter for therapeutic drug level monitoring: Secondary | ICD-10-CM

## 2023-09-10 LAB — POCT INR: INR: 2.9 (ref 2.0–3.0)

## 2023-09-10 NOTE — Telephone Encounter (Signed)
 Patient is OK to proceed per Dr. Carolynne Citron.  Advised patient of provider recommendations, but to contact office if symptoms worsen or he develops a fever. Patient voiced understanding.

## 2023-09-10 NOTE — Patient Instructions (Signed)
 Continue taking warfarin 1 tablet daily except for 1/2 a tablet on Sundays. Generator change 5/20;  Holding 5/18-19 Keep leafy green veggies consistent.  Recheck INR in 3 weeks.  Coumadin  Clinic [phone]

## 2023-09-10 NOTE — Telephone Encounter (Signed)
 Spoke with patient to discuss upcoming procedure.   Confirmed patient is scheduled for a ICD generator change on Monday, May 19 with Dr. Manya Sells. Instructed patient to arrive at the Main Entrance A at [facility]: [address] [place], [place] [address] and check in at Admitting at 5:30 AM.   Labs completed  Any recent signs of acute illness or been started on antibiotics? Pt reports having a dry, non-productive cough x 1 week with no improvement. Denies any other symptoms.  Any new medications started? No Any medications to hold? Hold Warfarin 2 days Medication instructions:  On the morning of your procedure you may take all your other morning medications. No eating or drinking after midnight prior to procedure.   The night before your procedure and the morning of your procedure, wash thoroughly with the CHG surgical soap from the neck down, paying special attention to the area where your procedure will be performed.  Advised of plan to go home the same day and will only stay overnight if medically necessary. You MUST have a responsible adult to drive you home and MUST be with you the first 24 hours after you arrive home.  Patient verbalized understanding to all instructions provided and agreed to proceed with procedure.

## 2023-09-10 NOTE — Telephone Encounter (Signed)
 See pre-procedure telephone call on 09/10/23 for provider recs.

## 2023-09-10 NOTE — Telephone Encounter (Signed)
 Patient came in and wanted someone to give him a call. He is having a procedure done on May 20 to get his pace maker changed and has developed a cough. He stated no fever or anything but would like for someone to give him a call on his cell. Thank you!

## 2023-09-16 ENCOUNTER — Ambulatory Visit: Payer: Self-pay | Admitting: Internal Medicine

## 2023-09-16 ENCOUNTER — Ambulatory Visit (INDEPENDENT_AMBULATORY_CARE_PROVIDER_SITE_OTHER): Payer: PPO

## 2023-09-16 DIAGNOSIS — I4891 Unspecified atrial fibrillation: Secondary | ICD-10-CM

## 2023-09-16 LAB — CUP PACEART REMOTE DEVICE CHECK
Battery Remaining Longevity: 0 mo
Battery Voltage: 2.57 V
Date Time Interrogation Session: 20250519044602
HighPow Impedance: 66 Ohm
HighPow Impedance: 66 Ohm
Implantable Lead Connection Status: 753985
Implantable Lead Connection Status: 753985
Implantable Lead Connection Status: 753985
Implantable Lead Implant Date: 20100521
Implantable Lead Implant Date: 20160721
Implantable Lead Implant Date: 20160721
Implantable Lead Location: 753858
Implantable Lead Location: 753859
Implantable Lead Location: 753860
Implantable Lead Model: 7122
Implantable Pulse Generator Implant Date: 20160721
Lead Channel Impedance Value: 510 Ohm
Lead Channel Impedance Value: 680 Ohm
Lead Channel Pacing Threshold Amplitude: 1 V
Lead Channel Pacing Threshold Amplitude: 1 V
Lead Channel Pacing Threshold Pulse Width: 0.5 ms
Lead Channel Pacing Threshold Pulse Width: 0.8 ms
Lead Channel Sensing Intrinsic Amplitude: 8.5 mV
Lead Channel Setting Pacing Amplitude: 2 V
Lead Channel Setting Pacing Amplitude: 2 V
Lead Channel Setting Pacing Pulse Width: 0.5 ms
Lead Channel Setting Pacing Pulse Width: 0.8 ms
Lead Channel Setting Sensing Sensitivity: 0.5 mV
Pulse Gen Serial Number: 7289863
Zone Setting Status: 755011

## 2023-09-16 NOTE — Pre-Procedure Instructions (Signed)
 Instructed patient on the following items: Arrival time 0515 Nothing to eat or drink after midnight No meds AM of procedure Responsible person to drive you home and stay with you for 24 hrs Wash with special soap night before and morning of procedure If on anti-coagulant drug instructions Coumadin -last dose 5/17

## 2023-09-17 ENCOUNTER — Ambulatory Visit (HOSPITAL_COMMUNITY)
Admission: RE | Disposition: A | Discharge status: Home / Self Care | Payer: Self-pay | Source: Home / Self Care | Attending: Internal Medicine

## 2023-09-17 ENCOUNTER — Encounter: Payer: Self-pay | Admitting: Emergency Medicine

## 2023-09-17 ENCOUNTER — Ambulatory Visit (HOSPITAL_COMMUNITY)
Admission: RE | Admit: 2023-09-17 | Discharge: 2023-09-17 | Disposition: A | Discharge status: Home / Self Care | Attending: Internal Medicine | Admitting: Internal Medicine

## 2023-09-17 ENCOUNTER — Other Ambulatory Visit: Payer: Self-pay

## 2023-09-17 DIAGNOSIS — I5022 Chronic systolic (congestive) heart failure: Secondary | ICD-10-CM | POA: Diagnosis not present

## 2023-09-17 DIAGNOSIS — I110 Hypertensive heart disease with heart failure: Secondary | ICD-10-CM | POA: Diagnosis not present

## 2023-09-17 DIAGNOSIS — Z4502 Encounter for adjustment and management of automatic implantable cardiac defibrillator: Secondary | ICD-10-CM | POA: Insufficient documentation

## 2023-09-17 DIAGNOSIS — Z87891 Personal history of nicotine dependence: Secondary | ICD-10-CM | POA: Insufficient documentation

## 2023-09-17 DIAGNOSIS — I447 Left bundle-branch block, unspecified: Secondary | ICD-10-CM | POA: Diagnosis not present

## 2023-09-17 DIAGNOSIS — Z5181 Encounter for therapeutic drug level monitoring: Secondary | ICD-10-CM

## 2023-09-17 DIAGNOSIS — I4891 Unspecified atrial fibrillation: Secondary | ICD-10-CM | POA: Insufficient documentation

## 2023-09-17 DIAGNOSIS — E785 Hyperlipidemia, unspecified: Secondary | ICD-10-CM | POA: Diagnosis not present

## 2023-09-17 DIAGNOSIS — Z79899 Other long term (current) drug therapy: Secondary | ICD-10-CM | POA: Diagnosis not present

## 2023-09-17 HISTORY — PX: BIV ICD GENERATOR CHANGEOUT: EP1194

## 2023-09-17 LAB — PROTIME-INR
INR: 1.4 — ABNORMAL HIGH (ref 0.8–1.2)
Prothrombin Time: 17.1 s — ABNORMAL HIGH (ref 11.4–15.2)

## 2023-09-17 SURGERY — BIV ICD GENERATOR CHANGEOUT

## 2023-09-17 MED ORDER — LIDOCAINE HCL (PF) 1 % IJ SOLN
INTRAMUSCULAR | Status: AC
  Filled 2023-09-17: qty 30

## 2023-09-17 MED ORDER — LIDOCAINE HCL (PF) 1 % IJ SOLN
INTRAMUSCULAR | Status: AC
  Filled 2023-09-17: qty 60

## 2023-09-17 MED ORDER — CEFAZOLIN SODIUM-DEXTROSE 2-4 GM/100ML-% IV SOLN: 2.0000 g | INTRAVENOUS | Status: DC

## 2023-09-17 MED ORDER — ONDANSETRON HCL 4 MG/2ML IJ SOLN: 4.0000 mg | Freq: Four times a day (QID) | INTRAMUSCULAR | Status: DC | PRN

## 2023-09-17 MED ORDER — ACETAMINOPHEN 325 MG PO TABS
325.0000 mg | ORAL_TABLET | ORAL | Status: DC | PRN
  Administered 2023-09-17: 650 mg via ORAL
  Filled 2023-09-17: qty 2

## 2023-09-17 MED ORDER — LIDOCAINE HCL (PF) 1 % IJ SOLN
INTRAMUSCULAR | Status: DC | PRN
Start: 2023-09-17 — End: 2023-09-17
  Administered 2023-09-17: 60 mL

## 2023-09-17 MED ORDER — MIDAZOLAM HCL 5 MG/5ML IJ SOLN
INTRAMUSCULAR | Status: DC | PRN
  Administered 2023-09-17 (×3): 1 mg via INTRAVENOUS

## 2023-09-17 MED ORDER — POVIDONE-IODINE 10 % EX SWAB
2.0000 | Freq: Once | CUTANEOUS | Status: AC
  Administered 2023-09-17: 2 via TOPICAL

## 2023-09-17 MED ORDER — SODIUM CHLORIDE 0.9 % IV SOLN
80.0000 mg | INTRAVENOUS | Status: AC
  Administered 2023-09-17: 80 mg

## 2023-09-17 MED ORDER — SODIUM CHLORIDE 0.9 % IV SOLN
INTRAVENOUS | Status: AC
  Filled 2023-09-17: qty 2

## 2023-09-17 MED ORDER — CHLORHEXIDINE GLUCONATE 4 % EX SOLN: 4.0000 | Freq: Once | CUTANEOUS | Status: DC

## 2023-09-17 MED ORDER — MIDAZOLAM HCL 2 MG/2ML IJ SOLN
INTRAMUSCULAR | Status: AC
  Filled 2023-09-17: qty 2

## 2023-09-17 MED ORDER — FENTANYL CITRATE (PF) 100 MCG/2ML IJ SOLN
INTRAMUSCULAR | Status: AC
  Filled 2023-09-17: qty 2

## 2023-09-17 MED ORDER — CEFAZOLIN SODIUM-DEXTROSE 2-4 GM/100ML-% IV SOLN
INTRAVENOUS | Status: AC
  Administered 2023-09-17: 2 g
  Filled 2023-09-17: qty 100

## 2023-09-17 MED ORDER — SODIUM CHLORIDE 0.9 % IV SOLN
INTRAVENOUS | Status: DC
Start: 2023-09-17 — End: 2023-09-17

## 2023-09-17 MED ORDER — FENTANYL CITRATE (PF) 100 MCG/2ML IJ SOLN
INTRAMUSCULAR | Status: DC | PRN
  Administered 2023-09-17 (×2): 12.5 ug via INTRAVENOUS
  Administered 2023-09-17: 25 ug via INTRAVENOUS

## 2023-09-17 SURGICAL SUPPLY — 9 items
CABLE SURGICAL S-101-97-12 (CABLE) ×1 IMPLANT
DEVICE CRT GALLANT HF DF1 (ICD Generator) IMPLANT
KIT WRENCH PACEMAKER ASSEM (MISCELLANEOUS) IMPLANT
PAD DEFIB RADIO PHYSIO CONN (PAD) ×1 IMPLANT
PIN PLUG IS-1 DEFIB (PIN) IMPLANT
POUCH AIGIS-R ANTIBACT ICD (Mesh General) ×1 IMPLANT
POUCH AIGIS-R ANTIBACT ICD LRG (Mesh General) IMPLANT
SYR CONTROL 10ML ANGIOGRAPHIC (SYRINGE) IMPLANT
TRAY PACEMAKER INSERTION (PACKS) ×1 IMPLANT

## 2023-09-17 NOTE — Discharge Instructions (Signed)

## 2023-09-17 NOTE — H&P (Signed)
 HPI [name] returns today for followup. He is a pleasant 88 yo man with a h/o atrial fib, symptomatic bradycardia and LBBB. He is s/p biV ICD and  paces 76% of the time. He has class 2 CHF symptoms. His EF was 15-20% initially then 25-30% by repeat echo. He has been on a beta blocker and ACE inhibitor and with initiation of lasix  his dyspnea has resolved and his peripheral edema improved.  He denies chest pain.  He remains active working in his yard.  Allergies       Allergies  Allergen Reactions   Zithromax  [Azithromycin ] Other (See Comments)      Affects Coumadin  levels                 Current Outpatient Medications  Medication Sig Dispense Refill   acetaminophen  (TYLENOL ) 325 MG tablet Take 650 mg by mouth 2 (two) times daily as needed (pain).       AMOXICILLIN  PO Take 200 mg by mouth. For Dental procedure       carvedilol  (COREG ) 6.25 MG tablet TAKE 1.5 TABLETS (9.375 MG TOTAL) BY MOUTH 2 (TWO) TIMES DAILY WITH A MEAL. 270 tablet 1   digoxin  (LANOXIN ) 0.125 MG tablet TAKE 1 TABLET BY MOUTH EVERY DAY 90 tablet 1   fluocinonide (LIDEX) 0.05 % external solution Apply 1 application topically 2 (two) times daily.       hydrocortisone 2.5 % lotion Apply topically 2 (two) times daily.       Lutein 20 MG CAPS Take 20 mg by mouth at bedtime.        rosuvastatin  (CRESTOR ) 10 MG tablet Take 1 tablet (10 mg total) by mouth daily. 90 tablet 3   warfarin (COUMADIN ) 5 MG tablet TAKE 1 TABLET BY MOUTH DAILY OR AS DIRECTED BY COUMADIN  CLINIC 100 tablet 0      No current facility-administered medications for this visit.              Past Medical History:  Diagnosis Date   AICD (automatic cardioverter/defibrillator) present     Anemia, iron deficiency      hx   Arthritis      "all over"   Atrial fibrillation [facility])      sees Dr. Richardo Chandler   Bronchial pneumonia X 3 "when I was a kid"   Cataract     Complication of anesthesia 1976    BP elevated with spinal;  "I was still  in the service when that happened"   GERD (gastroesophageal reflux disease)     GI bleed      hx   Glaucoma     H/O hiatal hernia     Hyperlipidemia     Hypertension     Psoriatic arthritis (HCC)      sees Dr. Zandra Hew    Tachycardia-bradycardia syndrome [facility])            ROS:    All systems reviewed and negative except as noted in the HPI.          Past Surgical History:  Procedure Laterality Date   APPENDECTOMY       CARDIAC CATHETERIZATION   09/10/08   CARDIAC DEFIBRILLATOR PLACEMENT   11/18/2014   CATARACT EXTRACTION, BILATERAL   2020    at [facility]    COLONOSCOPY   05-27-10    per Dr, Howard Macho, extensive diverticulosis, repeat in 10 yrs    EP IMPLANTABLE DEVICE N/A  11/18/2014    Procedure: BiV ICD Upgrade;  Surgeon: Tammie Fall, MD;  Location: [facility] INVASIVE CV LAB;  Service: Cardiovascular;  Laterality: N/A;   ESOPHAGOGASTRODUODENOSCOPY   05-27-10    per Dr. Howard Macho, normal    GLAUCOMA SURGERY Bilateral 2020    at [facility]    HAND SURGERY Left 1978    lft hand, fusions to DIPs and PIPs of fingers 1,2,4,and 5   INSERT / REPLACE / REMOVE PACEMAKER   2010   JOINT REPLACEMENT       PILONIDAL CYST EXCISION   1961   TOTAL HIP ARTHROPLASTY Bilateral 1998 `2001    bilateral, sees Dr. Watson Hacking    TOTAL HIP REVISION   02/25/2012    Procedure: TOTAL HIP REVISION;  Surgeon: Ilean Mall, MD;  Location: [facility] OR;  Service: Orthopedics;  Laterality: Left;                 Family History  Problem Relation Age of Onset   Hypertension Other     Heart disease Other              Social History         Socioeconomic History   Marital status: Married      Spouse name: Not on file   Number of children: Not on file   Years of education: Not on file   Highest education level: 12th grade  Occupational History   Not on file  Tobacco Use   Smoking status: Former      Current packs/day: 0.00      Average packs/day: 1 pack/day for 30.0 years (30.0 ttl pk-yrs)      Types:  Cigarettes, Pipe      Start date: 02/18/1952      Quit date: 02/17/1982      Years since quitting: 41.1   Smokeless tobacco: Former      Quit date: 05/01/1983  Vaping Use   Vaping status: Never Used  Substance and Sexual Activity   Alcohol  use: Yes      Alcohol /week: 3.0 standard drinks of alcohol       Types: 2 Shots of liquor, 1 Standard drinks or equivalent per week   Drug use: No   Sexual activity: Yes  Other Topics Concern   Not on file  Social History Narrative   Not on file    Social Drivers of Health        Financial Resource Strain: Low Risk  (04/04/2023)    Overall Financial Resource Strain (CARDIA)     Difficulty of Paying Living Expenses: Not hard at all  Food Insecurity: No Food Insecurity (04/04/2023)    Hunger Vital Sign     Worried About Running Out of Food in the Last Year: Never true     Ran Out of Food in the Last Year: Never true  Transportation Needs: No Transportation Needs (04/04/2023)    PRAPARE - Therapist, art (Medical): No     Lack of Transportation (Non-Medical): No  Physical Activity: Unknown (04/04/2023)    Exercise Vital Sign     Days of Exercise per Week: 0 days     Minutes of Exercise per Session: Not on file  Stress: No Stress Concern Present (04/04/2023)    [employer] of Occupational Health - Occupational Stress Questionnaire     Feeling of Stress : Not at all  Social Connections: Socially Isolated (04/04/2023)    Social Connection and Isolation Panel [NHANES]  Frequency of Communication with Friends and Family: Never     Frequency of Social Gatherings with Friends and Family: Never     Attends Religious Services: Never     Database administrator or Organizations: No     Attends Engineer, structural: Not on file     Marital Status: Married  Catering manager Violence: Not on file        BP 124/80   Pulse 71   Ht 5\' 8"  (1.727 m)   Wt 175 lb 9.6 oz (79.7 kg)   SpO2 98%   BMI 26.70 kg/m     Physical Exam:   Well appearing NAD HEENT: Unremarkable Neck:  No JVD, no thyromegally Lymphatics:  No adenopathy Back:  No CVA tenderness Lungs:  Clear with no wheezes HEART:  Regular rate rhythm, no murmurs, no rubs, no clicks Abd:  soft, positive bowel sounds, no organomegally, no rebound, no guarding Ext:  2 plus pulses, no edema, no cyanosis, no clubbing Skin:  No rashes no nodules Neuro:  CN II through XII intact, motor grossly intact   EKG - atrial fib with ventricular pacing   DEVICE  Normal device function.  See PaceArt for details.    Assess/Plan:   1. Atrial fib - his VR is well controlled. 2. ICD - his [employer] Biv ICD is working normally.  3. Chronic systolic heart failure - he has class 2 symptoms. He will continue guideline directed medical therapy. 4. Dyslipidemia - he will continue his statin therapy.   Pete Brand Kayla Deshaies,MD

## 2023-09-18 ENCOUNTER — Encounter (HOSPITAL_COMMUNITY): Payer: Self-pay | Admitting: Internal Medicine

## 2023-09-19 MED FILL — Midazolam HCl Inj 2 MG/2ML (Base Equivalent): INTRAMUSCULAR | Qty: 3 | Status: AC

## 2023-10-01 ENCOUNTER — Ambulatory Visit: Attending: Cardiology

## 2023-10-01 DIAGNOSIS — I5022 Chronic systolic (congestive) heart failure: Secondary | ICD-10-CM

## 2023-10-01 DIAGNOSIS — I4891 Unspecified atrial fibrillation: Secondary | ICD-10-CM

## 2023-10-01 LAB — CUP PACEART INCLINIC DEVICE CHECK
Battery Remaining Longevity: 93 mo
Brady Statistic RA Percent Paced: 0 %
Brady Statistic RV Percent Paced: 76 %
Date Time Interrogation Session: 20250603114335
HighPow Impedance: 52.875
Implantable Lead Connection Status: 753985
Implantable Lead Connection Status: 753985
Implantable Lead Connection Status: 753985
Implantable Lead Implant Date: 20100521
Implantable Lead Implant Date: 20160721
Implantable Lead Implant Date: 20160721
Implantable Lead Location: 753858
Implantable Lead Location: 753859
Implantable Lead Location: 753860
Implantable Lead Model: 7122
Implantable Pulse Generator Implant Date: 20250520
Lead Channel Impedance Value: 362.5 Ohm
Lead Channel Impedance Value: 475 Ohm
Lead Channel Impedance Value: 625 Ohm
Lead Channel Pacing Threshold Amplitude: 0.75 V
Lead Channel Pacing Threshold Amplitude: 0.75 V
Lead Channel Pacing Threshold Amplitude: 1 V
Lead Channel Pacing Threshold Amplitude: 1 V
Lead Channel Pacing Threshold Pulse Width: 0.5 ms
Lead Channel Pacing Threshold Pulse Width: 0.5 ms
Lead Channel Pacing Threshold Pulse Width: 0.5 ms
Lead Channel Pacing Threshold Pulse Width: 0.5 ms
Lead Channel Sensing Intrinsic Amplitude: 6.5 mV
Lead Channel Setting Pacing Amplitude: 2 V
Lead Channel Setting Pacing Amplitude: 2 V
Lead Channel Setting Pacing Pulse Width: 0.5 ms
Lead Channel Setting Pacing Pulse Width: 0.5 ms
Lead Channel Setting Sensing Sensitivity: 0.3 mV
Pulse Gen Serial Number: 111072965
Zone Setting Status: 755011

## 2023-10-01 NOTE — Patient Instructions (Signed)

## 2023-10-01 NOTE — Progress Notes (Signed)
 Normal Bi-V ICD wound check- programmed VVI. Incision well healed- edema noted around incision site- patient on warfarin. Reviewed with Michaelle Adolphus, PA- ok to conitnue warfarin- will advise CVRR to keep INR 2.0-2.5 during the initial healing phase.  Presenting rhythm: AF/BP- 76 . Routine testing performed. Thresholds, sensing, and impedances consistent with previous measurements. No treated arrhythmias. Reviewed shock plan.  Pt enrolled in remote follow-up.

## 2023-10-02 ENCOUNTER — Ambulatory Visit: Attending: Internal Medicine | Admitting: Pharmacist

## 2023-10-02 DIAGNOSIS — Z5181 Encounter for therapeutic drug level monitoring: Secondary | ICD-10-CM

## 2023-10-02 DIAGNOSIS — I4891 Unspecified atrial fibrillation: Secondary | ICD-10-CM | POA: Diagnosis not present

## 2023-10-02 LAB — POCT INR: INR: 2.1 (ref 2.0–3.0)

## 2023-10-02 NOTE — Patient Instructions (Signed)
 Description   Continue taking warfarin 1 tablet daily except for 1/2 a tablet on Sundays.  Keep leafy green veggies consistent.  Recheck INR in 4 weeks.  Coumadin  Clinic [phone]

## 2023-10-03 NOTE — Progress Notes (Signed)
 Remote ICD transmission.

## 2023-10-15 ENCOUNTER — Other Ambulatory Visit: Payer: Self-pay | Admitting: Cardiovascular Disease

## 2023-10-15 DIAGNOSIS — I4891 Unspecified atrial fibrillation: Secondary | ICD-10-CM

## 2023-10-30 ENCOUNTER — Ambulatory Visit: Attending: Internal Medicine | Admitting: Pharmacist

## 2023-10-30 DIAGNOSIS — Z5181 Encounter for therapeutic drug level monitoring: Secondary | ICD-10-CM

## 2023-10-30 DIAGNOSIS — I4891 Unspecified atrial fibrillation: Secondary | ICD-10-CM

## 2023-10-30 LAB — POCT INR: INR: 1.9 — AB (ref 2.0–3.0)

## 2023-10-30 NOTE — Progress Notes (Signed)
 Please see anti-coag encounter

## 2023-10-30 NOTE — Patient Instructions (Signed)
 Description   Take 1 and 1/2 tablets today and then continue taking warfarin 1 tablet daily except for 1/2 a tablet on Sundays.  Keep leafy green veggies consistent.  Recheck INR in 4 weeks.  Coumadin  Clinic [phone]

## 2023-11-08 NOTE — Progress Notes (Signed)
 Remote ICD transmission.

## 2023-11-27 ENCOUNTER — Ambulatory Visit: Attending: Internal Medicine

## 2023-11-27 DIAGNOSIS — Z5181 Encounter for therapeutic drug level monitoring: Secondary | ICD-10-CM | POA: Diagnosis not present

## 2023-11-27 DIAGNOSIS — I4891 Unspecified atrial fibrillation: Secondary | ICD-10-CM | POA: Diagnosis not present

## 2023-11-27 LAB — POCT INR: INR: 2.7 (ref 2.0–3.0)

## 2023-11-27 NOTE — Patient Instructions (Signed)
 Description   Continue taking warfarin 1 tablet daily except for 1/2 a tablet on Sundays.  Keep leafy green veggies consistent.  Recheck INR in 5 weeks.  Coumadin  Clinic [phone]

## 2023-11-27 NOTE — Progress Notes (Signed)
 INR 2.7  Please see anticoagulation encounter

## 2023-12-02 ENCOUNTER — Inpatient Hospital Stay (HOSPITAL_COMMUNITY)
Admission: EM | Admit: 2023-12-02 | Discharge: 2023-12-04 | DRG: 309 | Disposition: A | Discharge status: Home / Self Care | Attending: Internal Medicine | Admitting: Internal Medicine

## 2023-12-02 ENCOUNTER — Other Ambulatory Visit: Payer: Self-pay

## 2023-12-02 ENCOUNTER — Telehealth: Payer: Self-pay

## 2023-12-02 ENCOUNTER — Telehealth: Payer: Self-pay | Admitting: Internal Medicine

## 2023-12-02 ENCOUNTER — Ambulatory Visit: Payer: Self-pay

## 2023-12-02 ENCOUNTER — Emergency Department (HOSPITAL_COMMUNITY)

## 2023-12-02 ENCOUNTER — Telehealth (HOSPITAL_COMMUNITY): Payer: Self-pay | Admitting: Pharmacy Technician

## 2023-12-02 ENCOUNTER — Other Ambulatory Visit (HOSPITAL_COMMUNITY): Payer: Self-pay

## 2023-12-02 ENCOUNTER — Ambulatory Visit: Payer: PPO

## 2023-12-02 DIAGNOSIS — Z9841 Cataract extraction status, right eye: Secondary | ICD-10-CM

## 2023-12-02 DIAGNOSIS — Z96643 Presence of artificial hip joint, bilateral: Secondary | ICD-10-CM | POA: Diagnosis present

## 2023-12-02 DIAGNOSIS — L4050 Arthropathic psoriasis, unspecified: Secondary | ICD-10-CM | POA: Diagnosis present

## 2023-12-02 DIAGNOSIS — Z8249 Family history of ischemic heart disease and other diseases of the circulatory system: Secondary | ICD-10-CM

## 2023-12-02 DIAGNOSIS — I428 Other cardiomyopathies: Secondary | ICD-10-CM | POA: Diagnosis present

## 2023-12-02 DIAGNOSIS — I110 Hypertensive heart disease with heart failure: Secondary | ICD-10-CM | POA: Diagnosis present

## 2023-12-02 DIAGNOSIS — Z743 Need for continuous supervision: Secondary | ICD-10-CM | POA: Diagnosis not present

## 2023-12-02 DIAGNOSIS — H409 Unspecified glaucoma: Secondary | ICD-10-CM | POA: Diagnosis not present

## 2023-12-02 DIAGNOSIS — I472 Ventricular tachycardia, unspecified: Principal | ICD-10-CM | POA: Diagnosis present

## 2023-12-02 DIAGNOSIS — E785 Hyperlipidemia, unspecified: Secondary | ICD-10-CM | POA: Diagnosis not present

## 2023-12-02 DIAGNOSIS — D6869 Other thrombophilia: Secondary | ICD-10-CM | POA: Diagnosis present

## 2023-12-02 DIAGNOSIS — Z604 Social exclusion and rejection: Secondary | ICD-10-CM | POA: Diagnosis not present

## 2023-12-02 DIAGNOSIS — Z9842 Cataract extraction status, left eye: Secondary | ICD-10-CM

## 2023-12-02 DIAGNOSIS — Z7901 Long term (current) use of anticoagulants: Secondary | ICD-10-CM | POA: Diagnosis not present

## 2023-12-02 DIAGNOSIS — R55 Syncope and collapse: Secondary | ICD-10-CM | POA: Diagnosis present

## 2023-12-02 DIAGNOSIS — R531 Weakness: Secondary | ICD-10-CM | POA: Diagnosis not present

## 2023-12-02 DIAGNOSIS — I2510 Atherosclerotic heart disease of native coronary artery without angina pectoris: Secondary | ICD-10-CM | POA: Diagnosis present

## 2023-12-02 DIAGNOSIS — Z9049 Acquired absence of other specified parts of digestive tract: Secondary | ICD-10-CM | POA: Diagnosis not present

## 2023-12-02 DIAGNOSIS — Z881 Allergy status to other antibiotic agents status: Secondary | ICD-10-CM

## 2023-12-02 DIAGNOSIS — Z95810 Presence of automatic (implantable) cardiac defibrillator: Secondary | ICD-10-CM | POA: Diagnosis not present

## 2023-12-02 DIAGNOSIS — I10 Essential (primary) hypertension: Secondary | ICD-10-CM | POA: Diagnosis not present

## 2023-12-02 DIAGNOSIS — I700 Atherosclerosis of aorta: Secondary | ICD-10-CM | POA: Diagnosis not present

## 2023-12-02 DIAGNOSIS — Z87891 Personal history of nicotine dependence: Secondary | ICD-10-CM | POA: Diagnosis not present

## 2023-12-02 DIAGNOSIS — I5022 Chronic systolic (congestive) heart failure: Secondary | ICD-10-CM | POA: Diagnosis present

## 2023-12-02 DIAGNOSIS — I4821 Permanent atrial fibrillation: Secondary | ICD-10-CM | POA: Diagnosis present

## 2023-12-02 DIAGNOSIS — Z79899 Other long term (current) drug therapy: Secondary | ICD-10-CM | POA: Diagnosis not present

## 2023-12-02 DIAGNOSIS — R42 Dizziness and giddiness: Secondary | ICD-10-CM | POA: Diagnosis not present

## 2023-12-02 LAB — URINALYSIS, ROUTINE W REFLEX MICROSCOPIC
Bilirubin Urine: NEGATIVE
Glucose, UA: NEGATIVE mg/dL
Ketones, ur: NEGATIVE mg/dL
Nitrite: NEGATIVE
Protein, ur: NEGATIVE mg/dL
Specific Gravity, Urine: 1.014 (ref 1.005–1.030)
pH: 5 (ref 5.0–8.0)

## 2023-12-02 LAB — COMPREHENSIVE METABOLIC PANEL WITH GFR
ALT: 18 U/L (ref 0–44)
AST: 28 U/L (ref 15–41)
Albumin: 3.9 g/dL (ref 3.5–5.0)
Alkaline Phosphatase: 73 U/L (ref 38–126)
Anion gap: 10 (ref 5–15)
BUN: 18 mg/dL (ref 8–23)
CO2: 23 mmol/L (ref 22–32)
Calcium: 9.5 mg/dL (ref 8.9–10.3)
Chloride: 105 mmol/L (ref 98–111)
Creatinine, Ser: 0.91 mg/dL (ref 0.61–1.24)
GFR, Estimated: >60 mL/min (ref >60)
Glucose, Bld: 105 mg/dL — ABNORMAL HIGH (ref 70–99)
Potassium: 4.7 mmol/L (ref 3.5–5.1)
Sodium: 138 mmol/L (ref 135–145)
Total Bilirubin: 1.8 mg/dL — ABNORMAL HIGH (ref 0.0–1.2)
Total Protein: 6.8 g/dL (ref 6.5–8.1)

## 2023-12-02 LAB — CBC
HCT: 49.2 % (ref 39.0–52.0)
Hemoglobin: 15.4 g/dL (ref 13.0–17.0)
MCH: 31.1 pg (ref 26.0–34.0)
MCHC: 31.3 g/dL (ref 30.0–36.0)
MCV: 99.4 fL (ref 80.0–100.0)
Platelets: 160 K/uL (ref 150–400)
RBC: 4.95 MIL/uL (ref 4.22–5.81)
RDW: 13.7 % (ref 11.5–15.5)
WBC: 6.1 K/uL (ref 4.0–10.5)
nRBC: 0 % (ref 0.0–0.2)

## 2023-12-02 LAB — PROTIME-INR
INR: 2.4 — ABNORMAL HIGH (ref 0.8–1.2)
Prothrombin Time: 27.4 s — ABNORMAL HIGH (ref 11.4–15.2)

## 2023-12-02 LAB — TROPONIN I (HIGH SENSITIVITY)
Troponin I (High Sensitivity): 28 ng/L — ABNORMAL HIGH (ref <18)
Troponin I (High Sensitivity): 30 ng/L — ABNORMAL HIGH (ref <18)

## 2023-12-02 LAB — DIGOXIN LEVEL: Digoxin Level: 0.6 ng/mL — ABNORMAL LOW (ref 0.8–2.0)

## 2023-12-02 LAB — MAGNESIUM: Magnesium: 2.2 mg/dL (ref 1.7–2.4)

## 2023-12-02 MED ORDER — NITROGLYCERIN 0.4 MG SL SUBL: 0.4000 mg | SUBLINGUAL_TABLET | SUBLINGUAL | Status: DC | PRN

## 2023-12-02 MED ORDER — LACTATED RINGERS IV BOLUS
1000.0000 mL | Freq: Once | INTRAVENOUS | Status: AC
  Administered 2023-12-02: 1000 mL via INTRAVENOUS

## 2023-12-02 MED ORDER — AMIODARONE HCL IN DEXTROSE 360-4.14 MG/200ML-% IV SOLN
30.0000 mg/h | INTRAVENOUS | Status: AC
  Administered 2023-12-02 – 2023-12-04 (×4): 30 mg/h via INTRAVENOUS
  Filled 2023-12-02 (×4): qty 200

## 2023-12-02 MED ORDER — AMIODARONE LOAD VIA INFUSION
150.0000 mg | Freq: Once | INTRAVENOUS | Status: AC
  Administered 2023-12-02: 150 mg via INTRAVENOUS
  Filled 2023-12-02: qty 83.34

## 2023-12-02 MED ORDER — ROSUVASTATIN CALCIUM 5 MG PO TABS
10.0000 mg | ORAL_TABLET | Freq: Every day | ORAL | Status: DC
Start: 2023-12-02 — End: 2023-12-04
  Administered 2023-12-03 – 2023-12-04 (×2): 10 mg via ORAL
  Filled 2023-12-02 (×2): qty 2

## 2023-12-02 MED ORDER — AMIODARONE IV BOLUS ONLY 150 MG/100ML: 150.0000 mg | Freq: Once | INTRAVENOUS | Status: DC

## 2023-12-02 MED ORDER — ACETAMINOPHEN 325 MG PO TABS: 650.0000 mg | ORAL_TABLET | ORAL | Status: DC | PRN

## 2023-12-02 MED ORDER — ONDANSETRON HCL 4 MG/2ML IJ SOLN: 4.0000 mg | Freq: Four times a day (QID) | INTRAMUSCULAR | Status: DC | PRN

## 2023-12-02 MED ORDER — DIGOXIN 125 MCG PO TABS
125.0000 ug | ORAL_TABLET | Freq: Every day | ORAL | Status: DC
  Administered 2023-12-03 – 2023-12-04 (×2): 125 ug via ORAL
  Filled 2023-12-02 (×2): qty 1

## 2023-12-02 MED ORDER — MECLIZINE HCL 25 MG PO TABS
12.5000 mg | ORAL_TABLET | Freq: Once | ORAL | Status: AC
  Administered 2023-12-02: 12.5 mg via ORAL
  Filled 2023-12-02: qty 1

## 2023-12-02 MED ORDER — CARVEDILOL 6.25 MG PO TABS
6.2500 mg | ORAL_TABLET | Freq: Two times a day (BID) | ORAL | Status: DC
  Administered 2023-12-03 – 2023-12-04 (×3): 6.25 mg via ORAL
  Filled 2023-12-02 (×3): qty 1

## 2023-12-02 MED ORDER — AMIODARONE HCL IN DEXTROSE 360-4.14 MG/200ML-% IV SOLN
60.0000 mg/h | INTRAVENOUS | Status: AC
  Administered 2023-12-02: 60 mg/h via INTRAVENOUS
  Filled 2023-12-02: qty 200

## 2023-12-02 NOTE — Telephone Encounter (Signed)
 STAT if patient feels like he/she is going to faint   1. Are you feeling dizzy, lightheaded, or faint right now?  yes   2. Have you passed out?  no (If yes move to .[facility])   3. Do you have any other symptoms? no   4. Have you checked your HR and BP (record if available)? 153/95 75

## 2023-12-02 NOTE — ED Provider Notes (Signed)
 [place] EMERGENCY DEPARTMENT AT [facility] Provider Note   CSN: [id number] Arrival date & time: 12/02/23  9042     Patient presents with: Dizziness   Andrew Vance is a 88 y.o. male. With PMH of HTN, A-fib, and orthostatic hypotension.    The patient presents today with a two day history of dizziness. He reports that he started feeling dizzy, mostly when standing on Saturday and felt like he was going to pass out. He describes it as his brain feeling foggy. He denies LOC. He is unable to ilicit and inciting event, he just woke up Saturday and was feeling dizzy. Upon further interrogation, this does seem to be episodic and somewhat positional. He does have a pacemaker in place. Today, his PCP and cardiologist instructed him to come to the ED. He denies CP, leg swelling, SOB, Cough, N/V/D, vision changes, and stroke like symptoms.    Dizziness Associated symptoms: no chest pain, no diarrhea, no nausea, no shortness of breath, no vomiting and no weakness        Prior to Admission medications   Medication Sig Start Date End Date Taking? Authorizing Provider  acetaminophen  (TYLENOL ) 500 MG tablet Take 1,000 mg by mouth every 6 (six) hours as needed for moderate pain (pain score 4-6).    [provider]  amoxicillin  (AMOXIL ) 500 MG capsule Take 2,000 mg by mouth See admin instructions. Take 2000 mg 1 hour prior to dental work    [provider]  carvedilol  (COREG ) 6.25 MG tablet TAKE 1.5 TABLETS (9.375 MG TOTAL) BY MOUTH 2 (TWO) TIMES DAILY WITH A MEAL. Patient taking differently: Take 6.25 mg by mouth 2 (two) times daily with a meal. 01/18/23   Waddell Danelle ORN, MD  digoxin  (LANOXIN ) 0.125 MG tablet TAKE 1 TABLET BY MOUTH EVERY DAY 07/09/23   Waddell Danelle ORN, MD  fluocinonide (LIDEX) 0.05 % external solution Apply 1 application  topically daily.    [provider]  hydrocortisone 2.5 % lotion Apply 1 Application topically 2 (two) times daily.     [provider]  Lutein 20 MG CAPS Take 20 mg by mouth at bedtime.     [provider]  rosuvastatin  (CRESTOR ) 10 MG tablet Take 1 tablet (10 mg total) by mouth daily. 04/08/23   [name] DELENA, MD  warfarin (COUMADIN ) 5 MG tablet TAKE 1 TABLET BY MOUTH DAILY OR AS DIRECTED BY COUMADIN  CLINIC 10/15/23   Croitoru, Jerel, MD    Allergies: Zithromax  [azithromycin ]    Review of Systems  Constitutional:  Negative for activity change, appetite change, chills and fatigue.  HENT:  Negative for congestion.   Eyes:  Negative for visual disturbance.  Respiratory:  Negative for cough, chest tightness, shortness of breath and wheezing.   Cardiovascular:  Negative for chest pain and leg swelling.  Gastrointestinal:  Negative for abdominal pain, diarrhea, nausea and vomiting.  Neurological:  Positive for dizziness. Negative for seizures, facial asymmetry, weakness and numbness.    Updated Vital Signs BP 136/87 (BP Location: Right Arm)   Pulse 75   Temp 97.6 F (36.4 C) (Oral)   Resp 16   Ht 5' 8 (1.727 m)   Wt 78 kg   SpO2 98%   BMI 26.15 kg/m   Physical Exam Constitutional:      General: He is not in acute distress.    Appearance: Normal appearance.  HENT:     Head: Normocephalic.  Cardiovascular:     Rate and Rhythm:  Normal rate.     Pulses: Normal pulses.     Heart sounds: No murmur heard. Pulmonary:     Effort: Pulmonary effort is normal. No respiratory distress.     Breath sounds: Normal breath sounds. No wheezing, rhonchi or rales.  Abdominal:     General: Abdomen is flat.     Palpations: Abdomen is soft.  Musculoskeletal:        General: Normal range of motion.  Skin:    General: Skin is warm and dry.  Neurological:     Mental Status: He is alert.     (all labs ordered are listed, but only abnormal results are displayed) Labs Reviewed  COMPREHENSIVE METABOLIC PANEL WITH GFR  CBC  URINALYSIS, ROUTINE W REFLEX MICROSCOPIC  PROTIME-INR  CBG  MONITORING, ED    EKG: None  Radiology: No results found.   Medications Ordered in the ED  meclizine  (ANTIVERT ) tablet 12.5 mg (has no administration in time range)  lactated ringers  bolus 1,000 mL (has no administration in time range)    Medical Decision Making Amount and/or Complexity of Data Reviewed Labs: ordered. Radiology: ordered.  Risk Decision regarding hospitalization.   Dizziness A 88yo male presents with 2 day history of dizziness. Difficult to qualify in our conversation. Normotensive with regular rate and rhythm on exam. Do not suspect cardiogenic cause of pre-syncope at this time. EKG in atrial paced rhythm without arhythmia. Orthostatics WNL.  Suspect possible vertigo with description of positional dizziness. Will treat with meclizine  and fluids and monitor for improvement of symptoms.   Nonsustained Vtach Presyncope Pt mildly improved with medication, however pacemaker interrogation showed multiple runs of NSVT over the weekend. Pt then had one episode of VT here. Suspect this is the culprit of episodic dizziness with pre-syncope in patient.  Spoke with cardiology who will admit for symptomatic NSVT.      Final diagnoses:  None    ED Discharge Orders     None          Andrew Bitters, DO 12/02/23 1318    Andrew Jayson LABOR, DO 12/07/23 2157

## 2023-12-02 NOTE — Progress Notes (Signed)
 ANTICOAGULATION CONSULT NOTE  Pharmacy Consult for PTA warfarin > heparin  Indication: chest pain/ACS  Allergies  Allergen Reactions   Zithromax  [Azithromycin ] Other (See Comments)    Affects Coumadin  levels     Patient Measurements: Height: 5' 8 (172.7 cm) Weight: 78 kg (172 lb) IBW/kg (Calculated) : 68.4 Heparin  Dosing Weight: 78 kg   Vital Signs: Temp: 97.8 F (36.6 C) (08/04 1504) Temp Source: Oral (08/04 1500) BP: 159/100 (08/04 1504) Pulse Rate: 76 (08/04 1504)  Labs: Recent Labs    12/02/23 1010 12/02/23 1023 12/02/23 1331  HGB 15.4  --   --   HCT 49.2  --   --   PLT 160  --   --   LABPROT  --  27.4*  --   INR  --  2.4*  --   CREATININE 0.91  --   --   TROPONINIHS 28*  --  30*    Estimated Creatinine Clearance: 57.4 mL/min (by C-G formula based on SCr of 0.91 mg/dL).  Medical History: Past Medical History:  Diagnosis Date   AICD (automatic cardioverter/defibrillator) present    Anemia, iron deficiency    hx   Arthritis    all over   Atrial fibrillation [facility])    sees Dr. 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 Elsie Chol    Tachycardia-bradycardia syndrome [facility])     Medications:  See [facility]  Assessment: 88 yo male presents with presents with symptomatic VT on warfarin PTA for history of Afib.  Pharmacy consulted for heparin  dosing.  PTA warfarin = 2.5 mg Sun, 5 mg all other days per 11/27/23 anticoag visit (INR 2.7; not interested in DOAC in 12/2019)  INR 2.4 - therapeutic on PTA warfarin.  CBC stable.   Goal of Therapy:  Heparin  level 0.3-0.7 units/ml Monitor platelets by anticoagulation protocol: Yes   Plan:  Daily INR, CBC -  start heparin  when INR <2  Maurilio Fila, PharmD Clinical Pharmacist 12/02/2023  3:26 PM

## 2023-12-02 NOTE — Telephone Encounter (Signed)
 Patient Product/process development scientist completed.    The patient is insured through [employer]/ [employer]. Patient has Medicare and is not eligible for a copay card, but may be able to apply for patient assistance or Medicare RX Payment Plan (Patient Must reach out to their plan, if eligible for payment plan), if available.    Ran test claim for Eliquis 5 mg and the current 30 day co-pay is $47.00.  Ran test claim for Xarelto 20 mg and the current 30 day co-pay is $47.00.  This test claim was processed through [facility]- copay amounts may vary at other pharmacies due to pharmacy/plan contracts, or as the patient moves through the different stages of their insurance plan.     Roland Earl, CPHT Pharmacy Technician III Certified Patient Advocate [facility] Pharmacy Patient Advocate Team Direct Number: [phone]  Fax: [phone]

## 2023-12-02 NOTE — Telephone Encounter (Signed)
 Alert received from [employer] for VT episode in monitor zone. Per Epic Telephone encounter on 12/02/23 pt c/o dizziness x 2 days. Routed to alert group for review.  3 VT classified episodes on 12/01/23, longest 28 sec, EGMs c/e NSVT at 187=190 bpm. 13 NSVT classified episodes, longest 10 sec on 11/30/23 at 2:20 pm, 2 EGMs c/w AF with RVR at 144-172 bpm, remaining EGMs c/w NSVT at 174-197 bpm.   Secure chat sent to Schuyler Novak, DO at [facility] ED to notify about alert.

## 2023-12-02 NOTE — Progress Notes (Signed)
 Patient had more NSVT, he had dizziness during the episodes. Discussed with EP PA, rebolused with IV amiodarone  150mg 

## 2023-12-02 NOTE — Telephone Encounter (Signed)
Pt is currently at the [facility]

## 2023-12-02 NOTE — Telephone Encounter (Signed)
 Verified patient's name and DOB. Patient states he is having dizziness spells to the point of feeling like he is about to faint.  Dizzy spells started 2 days ago and last about 5 minutes each. Dizzy spells happen every hour or 2. Patient stated he is usually resting or sitting when he feels dizzy and he was woken during his sleep this morning feeling dizzy.  Patient has been taking medications as prescribed and there hasn't been a change to medications recently. Patient denies additional symptoms other than dizziness. Blood pressure this morning was 153/95.  With dizziness being constant and blood pressure being elevated, I advised patient to go to ED for evaluation. Patient is with his wife currently and she can take him to hospital. Patient verbalized understanding.  Josie RN

## 2023-12-02 NOTE — ED Notes (Signed)
 [facility] called advised about 30 minutes

## 2023-12-02 NOTE — ED Triage Notes (Signed)
 Patient to ED by POV with c/o dizziness's. He states it started 2 days ago. Reports HTN this morning but no PMH. He takes warfarin and reports falling 1 month ago having injury to head.

## 2023-12-02 NOTE — Consult Note (Addendum)
 ELECTROPHYSIOLOGY CONSULT NOTE    Patient ID: [name] MRN: [id number], DOB/AGE: 01/11/1936 88 y.o.  Admit date: 12/02/2023 Date of Consult: 12/02/2023  Primary Physician: [name] DELENA, MD Primary Cardiologist: None  Electrophysiologist: Dr. Waddell   Referring Provider: Dr. Mona  Patient Profile: Andrew Vance is a 88 y.o. male with a history of permanent AF, symptomatic bradycardia s/p PPM, NICM, CHF s/p BIV upgrade who is being seen today for the evaluation of symptomatic VT at the request of Dr. Mona.  HPI:  Andrew Vance is a 88 y.o. male with medical history as above. Well known to EP team.   Underwent gen change to his previously implanted CRT-D 08/2023.  Pt was his USOH until Saturday, when he started having increasing dizzy spells.  Only change was amoxicillin  s/p dental procedure.   Otherwise, no complaints and compliant with meds. He contacted our office this am and was instructed to present to the ED. Subsequent device alert and interrogation revealed VT in his monitor zone, which correlated with his symptoms. He was started on IV amiodarone  and transferred to [facility] for EP evaluation.   He is feeling OK on my exam, and repeats history as above.  Has recurrence of VT with symptoms, near syncope.  Otherwise had been doing OK. No illness or significant stressors. No missed medications. No frank syncope or chest pain.    Labs Potassium4.7 (08/04 1010) Magnesium   2.2 (08/04 1329) Creatinine, ser  0.91 (08/04 1010) PLT  160 (08/04 1010) HGB  15.4 (08/04 1010) WBC 6.1 (08/04 1010) Troponin I (High Sensitivity)30* (08/04 1331).    Past Medical History:  Diagnosis Date   AICD (automatic cardioverter/defibrillator) present    Anemia, iron deficiency    hx   Arthritis    all over   Atrial fibrillation [facility])    sees Dr. Elspeth Sage   Bronchial pneumonia X 3 when I was a kid   Cataract    Complication of anesthesia 1976   BP elevated with spinal;  I was  still in the service when that happened   GERD (gastroesophageal reflux disease)    GI bleed    hx   Glaucoma    H/O hiatal hernia    Hyperlipidemia    Hypertension    Psoriatic arthritis (HCC)    sees Dr. Elsie Chol    Tachycardia-bradycardia syndrome [facility])      Surgical History:  Past Surgical History:  Procedure Laterality Date   APPENDECTOMY     BIV ICD GENERATOR CHANGEOUT N/A 09/17/2023   Procedure: BIV ICD GENERATOR CHANGEOUT;  Surgeon: Waddell Danelle ORN, MD;  Location: [facility] INVASIVE CV LAB;  Service: Cardiovascular;  Laterality: N/A;   CARDIAC CATHETERIZATION  09/10/08   CARDIAC DEFIBRILLATOR PLACEMENT  11/18/2014   CATARACT EXTRACTION, BILATERAL  2020   at [facility]    COLONOSCOPY  05-27-10   per Dr, Teressa, extensive diverticulosis, repeat in 10 yrs    EP IMPLANTABLE DEVICE N/A 11/18/2014   Procedure: BiV ICD Upgrade;  Surgeon: Danelle ORN Waddell, MD;  Location: [facility] INVASIVE CV LAB;  Service: Cardiovascular;  Laterality: N/A;   ESOPHAGOGASTRODUODENOSCOPY  05-27-10   per Dr. Teressa, normal    GLAUCOMA SURGERY Bilateral 2020   at [facility]    HAND SURGERY Left 1978   lft hand, fusions to DIPs and PIPs of fingers 1,2,4,and 5   INSERT / REPLACE / REMOVE PACEMAKER  2010   JOINT REPLACEMENT     PILONIDAL CYST EXCISION  1961   TOTAL HIP ARTHROPLASTY Bilateral 1998 `2001   bilateral, sees Dr. [name]    TOTAL HIP REVISION  02/25/2012   Procedure: TOTAL HIP REVISION;  Surgeon: Dempsey JINNY Sensor, MD;  Location: [facility] OR;  Service: Orthopedics;  Laterality: Left;     Medications Prior to Admission  Medication Sig Dispense Refill Last Dose/Taking   acetaminophen  (TYLENOL ) 500 MG tablet Take 1,000 mg by mouth every 6 (six) hours as needed for moderate pain (pain score 4-6).      amoxicillin  (AMOXIL ) 500 MG capsule Take 2,000 mg by mouth See admin instructions. Take 2000 mg 1 hour prior to dental work      carvedilol  (COREG ) 6.25 MG tablet TAKE 1.5 TABLETS (9.375 MG TOTAL) BY MOUTH 2 (TWO) TIMES DAILY  WITH A MEAL. (Patient taking differently: Take 6.25 mg by mouth 2 (two) times daily with a meal.) 270 tablet 1    digoxin  (LANOXIN ) 0.125 MG tablet TAKE 1 TABLET BY MOUTH EVERY DAY 90 tablet 3    fluocinonide (LIDEX) 0.05 % external solution Apply 1 application  topically daily.      hydrocortisone 2.5 % lotion Apply 1 Application topically 2 (two) times daily.      Lutein 20 MG CAPS Take 20 mg by mouth at bedtime.       rosuvastatin  (CRESTOR ) 10 MG tablet Take 1 tablet (10 mg total) by mouth daily. 90 tablet 3    warfarin (COUMADIN ) 5 MG tablet TAKE 1 TABLET BY MOUTH DAILY OR AS DIRECTED BY COUMADIN  CLINIC 100 tablet 0     Inpatient Medications:   carvedilol   6.25 mg Oral BID WC   [START ON 12/03/2023] digoxin   125 mcg Oral Daily   rosuvastatin   10 mg Oral Daily    Allergies:  Allergies  Allergen Reactions   Zithromax  [Azithromycin ] Other (See Comments)    Affects Coumadin  levels     Family History  Problem Relation Age of Onset   Hypertension Other    Heart disease Other      Physical Exam: Vitals:   12/02/23 1003 12/02/23 1009 12/02/23 1500 12/02/23 1504  BP: 136/87  (!) 159/97 (!) 159/100  Pulse: 75  76 76  Resp: 16     Temp: 97.6 F (36.4 C)  97.8 F (36.6 C) 97.8 F (36.6 C)  TempSrc: Oral  Oral   SpO2: 98%  99% 99%  Weight:  78 kg    Height:  5' 8 (1.727 m)      GEN- NAD, A&O x 3, normal affect HEENT: Normocephalic, atraumatic Lungs- CTAB, Normal effort.  Heart- Regular rate and rhythm, No M/G/R.  GI- Soft, NT, ND.  Extremities- No clubbing, cyanosis, or edema   Radiology/Studies: DG Chest Portable 1 View Result Date: 12/02/2023 CLINICAL DATA:  Dizziness EXAM: PORTABLE CHEST 1 VIEW COMPARISON:  11/19/2014 FINDINGS: Left AICD remains in place, unchanged. Heart and mediastinal contours are within normal limits. No focal opacities or effusions. No acute bony abnormality. Aortic atherosclerosis. IMPRESSION: No active cardiopulmonary disease. Electronically Signed    By: Franky Crease M.D.   On: 12/02/2023 13:00    EKG:on arrival showed V pacing with permanent AF, HR in 70s (personally reviewed)  TELEMETRY: AF with controlled V rate. NSVT noted. Symptomatic. (personally reviewed)  DEVICE HISTORY: [employer] BiV ICD implanted 2010, BiV upgrade 10/2014 for CHF    Gen change 08/2023  Assessment/Plan:  VT 160-180s and symptomatic with near syncope Update Echo, previously 25-30% Start IV amiodarone  with bolus. OK  to rebolus an additional 150 mg if has further symptomatic VT.  Potassium4.7 (08/04 1010) Magnesium   2.2 (08/04 1329) Creatinine, ser  0.91 (08/04 1010) Keep K > 4.0 and Mg > 2.0   Permanent AF Will use heparin  for now in case cath needed.  CAD Non obstructive by prior cath If EF significantly down or significant troponin consider cath   For questions or updates, please contact [facility] Please consult [email or web address] for contact info under     Signed, Ozell Prentice Passey, PA-C  12/02/2023, 3:33 PM    I have seen, examined the patient, and reviewed the above assessment and plan.    HPI: Andrew Vance is a 88 y.o. male with a history of permanent AF, symptomatic bradycardia s/p PPM, NICM, CHF s/p BIV upgrade who was having episodes of dizziness and near syncope.  He was advised to go to the ED for evaluation. ICD was interrogated remotely and showed episodes of ventricular tachycardia.  He has continued to have nonsustained episodes which correlate with his dizzy symptoms.  Outside of these episodes he is feeling relatively well.   General: Well developed, in no acute distress.  Neck: No JVD.  Cardiac: Normal rate, regular rhythm.  Resp: Normal work of breathing.  Ext: No edema.  Neuro: No gross focal deficits.  Psych: Normal affect.   Assessment and Plan:  #. VT: Likely scar mediated in setting of NICM.  Does not appear significantly volume overloaded and no anginal symptoms.  Did not receive device therapies. #.  Near syncope - Load with IV amiodarone . Transition to oral after 24 hour lV load.  - Repeat echocardiogram. If significant drop in LVEF then LHC.    #. Permanent AF #. Secondary hypercoagulable state due to AF:  - IV heparin  for anti-coagulation in the event that LHC is pursued.   #. Chronic systolic heart failure #. NICM - Repeat echocardiogram - Management per cardiology  Fonda Kitty, MD 12/02/2023 10:04 PM

## 2023-12-02 NOTE — ED Notes (Signed)
 Report called to Francina, RN on 3E.

## 2023-12-02 NOTE — Telephone Encounter (Signed)
  FYI Only or Action Required?: Action required by provider: update on patient condition.  Patient was last seen in primary care on 04/08/2023 by [name] LABOR, MD.  Called Nurse Triage reporting Dizziness.  Symptoms began 2-3 days ago.  Interventions attempted: Rest, hydration, or home remedies.  Symptoms are: gradually worsening.  Triage Disposition: Go to ED Now (or PCP Triage)  Patient/caregiver understands and will follow disposition?: Yes                 Copied from CRM [id number]. Topic: Clinical - Red Word Triage >> Dec 02, 2023  8:00 AM [place] wrote: Red Word that prompted transfer to Nurse Triage: Dizzy spells to the point of almost passing out- Blood pressure running higher than it has. pace maker replaced a month ago Reason for Disposition  SEVERE dizziness (e.g., unable to stand, requires support to walk, feels like passing out now)  Answer Assessment - Initial Assessment Questions Pacemaker replaced a month ago Blood Pressure this morning was higher than patient has ever seen 153/95 this morning 12/02/2023 Patient states light headed while on the phone with this RN Last episode was an hour prior to triage with this RN Patient states he did fall three weeks ago and hit his head and did not get that evaluated--he said he hit so hard he had a black eye at that time Patient states that his dizziness episodes are so severe that he has to hold on to something to keep from falling Patient is advised that the recommendation at this time is to go to the Emergency Room to be evaluated Patient is also advised that if anything worsens to call 911 for an ambulance Patient verbalized understanding and states that his wife is with him at this time to take him to the Emergency Room   1. DESCRIPTION: Describe your dizziness.     Light headedness and occasionally get some spinning 2. LIGHTHEADED: Do you feel lightheaded? (e.g., somewhat faint, woozy, weak upon  standing)     Not at this moment 3. VERTIGO: Do you feel like either you or the room is spinning or tilting? (i.e., vertigo)     sometimes 4. SEVERITY: How bad is it?  Do you feel like you are going to faint? Can you stand and walk?     ------ 5. ONSET:  When did the dizziness begin?     The past 2-3 days ago 6. AGGRAVATING FACTORS: Does anything make it worse? (e.g., standing, change in head position)     Random  several times a day 7. HEART RATE: Can you tell me your heart rate? How many beats in 15 seconds?  (Note: Not all patients can do this.)       75 when he took his blood pressure this morning 8. CAUSE: What do you think is causing the dizziness? (e.g., decreased fluids or food, diarrhea, emotional distress, heat exposure, new medicine, sudden standing, vomiting; unknown)     Just haven't  9. RECURRENT SYMPTOM: Have you had dizziness before? If Yes, ask: When was the last time? What happened that time?     Never 10. OTHER SYMPTOMS: Do you have any other symptoms? (e.g., fever, chest pain, vomiting, diarrhea, bleeding)       No  Protocols used: Dizziness - Lightheadedness-A-AH

## 2023-12-02 NOTE — H&P (Signed)
 Cardiology Admission History and Physical   Patient ID: Vance Andrew MRN: [id number]; DOB: Sep 24, 1935   Admission date: 12/02/2023  PCP:  [name] DELENA, MD   [facility] Providers Cardiologist:  None  Electrophysiologist:  Danelle Birmingham, MD      Chief Complaint: Dizziness  Patient Profile: Andrew Vance is a 88 y.o. male with minimal CAD on cath 2010, NICM s/p Abbott BiV ICD (recent device change out), A-fib, hypertension, hyperlipidemia, LBBB and a history of symptomatic bradycardia who is being seen 12/02/2023 for the evaluation of symptomatic VT.  History of Present Illness: [name] is a 88 year old male with past medical history of minimal CAD on cath 2010, NICM s/p Abbott BiV ICD (recent device change out), A-fib, hypertension, hyperlipidemia, LBBB and a history of symptomatic bradycardia.  Chronic catheterization performed on 09/10/2008 demonstrated EF 20%, 30 to 40% disease in proximal RCA, mild luminal irregularities in proximal and mid LAD.  Most recent echocardiogram performed on 10/14/2014 demonstrated EF 25 to 30%, moderate MR, massively dilated left atrium.  Patient underwent device change with placement of a new Abbott biventricular ICD by Dr. Birmingham on 09/17/2023.  Postprocedure he has been doing well.  He was in his usual state of health until Saturday, 11/30/2023 when he started having intermittent dizzy spell.  Only medication change recently was he took a dose of amoxicillin  prior to dental procedure last Tuesday.  He has been feeling well without any fever, chill, chest pain or shortness of breath.  He has been compliant with his medication including digoxin  and carvedilol  along with warfarin. He contacted our office this morning and he was instructed to go to the emergency room.  Remote device interrogation shows patient had 3 episode of VT recently, longest lasting 28 seconds.  While in the emergency room, he also had a short episode of VT as well and was  symptomatic with dizziness during that episode.  He is to be transferred to [facility] and be admitted under cardiology service.  Per report, his VT was seen in the monitor zone and 194 bpm slower than therapeutic zones.   Past Medical History:  Diagnosis Date   AICD (automatic cardioverter/defibrillator) present    Anemia, iron deficiency    hx   Arthritis    all over   Atrial fibrillation [facility])    sees Dr. 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 Elsie Chol    Tachycardia-bradycardia syndrome [facility])    Past Surgical History:  Procedure Laterality Date   APPENDECTOMY     BIV ICD GENERATOR CHANGEOUT N/A 09/17/2023   Procedure: BIV ICD GENERATOR CHANGEOUT;  Surgeon: Birmingham Danelle ORN, MD;  Location: [facility] INVASIVE CV LAB;  Service: Cardiovascular;  Laterality: N/A;   CARDIAC CATHETERIZATION  09/10/08   CARDIAC DEFIBRILLATOR PLACEMENT  11/18/2014   CATARACT EXTRACTION, BILATERAL  2020   at [facility]    COLONOSCOPY  05-27-10   per Dr, Teressa, extensive diverticulosis, repeat in 10 yrs    EP IMPLANTABLE DEVICE N/A 11/18/2014   Procedure: BiV ICD Upgrade;  Surgeon: Danelle LELON Birmingham, MD;  Location: [facility] INVASIVE CV LAB;  Service: Cardiovascular;  Laterality: N/A;   ESOPHAGOGASTRODUODENOSCOPY  05-27-10   per Dr. Teressa, normal    GLAUCOMA SURGERY Bilateral 2020   at [facility]    HAND SURGERY Left 1978   lft hand, fusions to DIPs and PIPs of fingers 1,2,4,and 5   INSERT / REPLACE / REMOVE PACEMAKER  2010   JOINT REPLACEMENT     PILONIDAL CYST EXCISION  1961   TOTAL HIP ARTHROPLASTY Bilateral 1998 `2001   bilateral, sees Dr. [name]    TOTAL HIP REVISION  02/25/2012   Procedure: TOTAL  HIP REVISION;  Surgeon: Dempsey JINNY Sensor, MD;  Location: [facility] OR;  Service: Orthopedics;  Laterality: Left;     Medications Prior to Admission: Prior to Admission medications   Medication Sig Start Date End Date Taking? Authorizing Provider  acetaminophen  (TYLENOL ) 500 MG tablet Take 1,000 mg by mouth every 6 (six) hours as needed for moderate pain (pain score 4-6).    [provider]  amoxicillin  (AMOXIL ) 500 MG capsule Take 2,000 mg by mouth See admin instructions. Take 2000 mg 1 hour prior to dental work    [provider]  carvedilol  (COREG ) 6.25 MG tablet TAKE 1.5 TABLETS (9.375 MG TOTAL) BY MOUTH 2 (TWO) TIMES DAILY WITH A MEAL. Patient taking differently: Take 6.25 mg by mouth 2 (two) times daily with a meal. 01/18/23   Birmingham Danelle LELON, MD  digoxin  (LANOXIN ) 0.125 MG tablet TAKE 1 TABLET BY MOUTH EVERY DAY 07/09/23   Birmingham Danelle LELON, MD  fluocinonide (LIDEX) 0.05 % external solution Apply 1 application  topically daily.    [provider]  hydrocortisone 2.5 % lotion Apply 1 Application topically 2 (two) times daily.    [provider]  Lutein 20 MG CAPS Take 20 mg by mouth at bedtime.     [provider]  rosuvastatin  (CRESTOR ) 10 MG tablet Take 1 tablet (10 mg total) by mouth daily. 04/08/23   [name] LABOR, MD  warfarin (COUMADIN ) 5 MG tablet TAKE 1 TABLET BY MOUTH DAILY OR AS DIRECTED BY COUMADIN  CLINIC 10/15/23   Croitoru, Jerel, MD     Allergies:    Allergies  Allergen Reactions   Zithromax  [Azithromycin ] Other (See Comments)    Affects Coumadin  levels     Social History:   Social History   Socioeconomic History   Marital status: Married    Spouse name: Not on file   Number of children: Not on file   Years of education: Not on file   Highest education level: 12th grade  Occupational History   Not on file  Tobacco Use   Smoking status: Former    Current packs/day: 0.00    Average packs/day: 1 pack/day for 30.0 years (30.0 ttl  pk-yrs)    Types: Cigarettes, Pipe    Start date: 02/18/1952    Quit date: 02/17/1982    Years since quitting: 41.8   Smokeless tobacco: Former    Quit date: 05/01/1983  Vaping Use   Vaping status: Never Used  Substance and Sexual Activity   Alcohol  use: Yes    Alcohol /week: 3.0 standard drinks of alcohol     Types: 2 Shots of liquor, 1 Standard drinks or equivalent per week   Drug use: No   Sexual activity: Yes  Other Topics Concern   Not on file  Social History Narrative   Not on file   Social Drivers of Health  Financial Resource Strain: Low Risk  (04/04/2023)   Overall Financial Resource Strain (CARDIA)    Difficulty of Paying Living Expenses: Not hard at all  Food Insecurity: No Food Insecurity (04/04/2023)   Hunger Vital Sign    Worried About Running Out of Food in the Last Year: Never true    Ran Out of Food in the Last Year: Never true  Transportation Needs: No Transportation Needs (04/04/2023)   PRAPARE - Administrator, Civil Service (Medical): No    Lack of Transportation (Non-Medical): No  Physical Activity: Unknown (04/04/2023)   Exercise Vital Sign    Days of Exercise per Week: 0 days    Minutes of Exercise per Session: Not on file  Stress: No Stress Concern Present (04/04/2023)   [employer] of Occupational Health - Occupational Stress Questionnaire    Feeling of Stress : Not at all  Social Connections: Socially Isolated (04/04/2023)   Social Connection and Isolation Panel    Frequency of Communication with Friends and Family: Never    Frequency of Social Gatherings with Friends and Family: Never    Attends Religious Services: Never    Database administrator or Organizations: No    Attends Engineer, structural: Not on file    Marital Status: Married  Catering manager Violence: Not on file     Family History:   The patient's family history includes Heart disease in an other family member; Hypertension in an other family member.     ROS:  Please see the history of present illness.  All other ROS reviewed and negative.     Physical Exam/Data: Vitals:   12/02/23 1003 12/02/23 1009  BP: 136/87   Pulse: 75   Resp: 16   Temp: 97.6 F (36.4 C)   TempSrc: Oral   SpO2: 98%   Weight:  78 kg  Height:  5' 8 (1.727 m)   No intake or output data in the 24 hours ending 12/02/23 1338    12/02/2023   10:09 AM 09/17/2023    6:47 AM 04/16/2023    2:30 PM  Last 3 Weights  Weight (lbs) 172 lb 172 lb 175 lb 9.6 oz  Weight (kg) 78.019 kg 78.019 kg 79.652 kg     Body mass index is 26.15 kg/m.  General:  Well nourished, well developed, in no acute distress HEENT: normal Neck: no JVD Vascular: No carotid bruits; Distal pulses 2+ bilaterally   Cardiac:  normal S1, S2; RRR; no murmur  Lungs:  clear to auscultation bilaterally, no wheezing, rhonchi or rales  Abd: soft, nontender, no hepatomegaly  Ext: no edema Musculoskeletal:  No deformities, BUE and BLE strength normal and equal Skin: warm and dry  Neuro:  CNs 2-12 intact, no focal abnormalities noted Psych:  Normal affect   EKG:  The ECG that was done and was personally reviewed and demonstrates   Relevant CV Studies:  Cath 09/10/2008 CARDIAC CATHETERIZATION    PRIMARY CARDIOLOGIST:  Elspeth KYM Sage, MD, [facility]    PRIMARY CARE PHYSICIAN:  Lynwood BIRCH. Kindl, MD    PROCEDURES PERFORMED:  1. Left heart catheterization.  2. Selective coronary angiography.  3. Left ventricular angiogram.  4. Right heart catheterization.    OPERATOR:  Lonni Cash, MD    INDICATIONS:  New diagnosis of cardiomyopathy in a 88 year old Caucasian  male with a history of atrial fibrillation.  This patient was referred  for diagnostic left and right heart catheterization by his primary  cardiologist,  Dr. Elspeth Sage.    DETAILS OF PROCEDURE:  The patient was brought into the Outpatient  Cardiac Catheterization Laboratory after signing informed consent for  the procedure.  The  right groin was prepped and draped in sterile  fashion.  An 1% lidocaine  was used for local anesthesia.  A 4-French  sheath was inserted into the right femoral artery and a 6-French sheath  was inserted into the right femoral vein.  A right heart catheterization  was performed with a multipurpose catheter.  Coronary angiography was  performed with standard diagnostic catheters.  I actually had to upsize  to a JL-5 for the injection of the left coronary system.  A pigtail  catheter was used to perform a left ventricular angiogram.  There was no  significant pressure gradient noted across the aortic valve with  pullback of the catheter.  The patient tolerated the procedure well and  was taken to the holding area in stable condition.    HEMODYNAMIC FINDINGS:  1. Central aortic pressure 112/63.  2. Right atrial pressure 5.  3. Right ventricular pressure 23/2.  4. Pulmonary artery pressure 21/8 with a mean of 14.  5. Pulmonary capillary wedge pressure mean of 6.  6. Left ventricular pressure 104/10.  7. Left ventricular end-diastolic pressure 11.  8. Central aortic saturation 98%.  9. Pulmonary artery saturation 68%.  10.Cardiac output 4.1 L/min.  11.Cardiac index 1.9 L/min/sq. m (by Fick method).    ANGIOGRAPHIC FINDINGS:  1. The left main coronary artery has no significant stenosis.  2. The left anterior descending is a large vessel that courses to the      apex and gives off 3 diagonal branches.  There are mild luminal      irregularities noted in the proximal and midportion of the LAD.      First diagonal is a moderate-to-large size vessel with mild plaque      disease.  Second diagonal is small in caliber.  The third diagonal      is moderate size with plaque disease only.  3. The circumflex artery gives off moderate-sized first and second      obtuse marginal branches that are free of any significant disease.      The third obtuse marginal branch is a moderate-to-large-sized       vessel that has mild luminal irregularities.  4. The right coronary artery is a moderate-sized vessel that has a      proximal 30-40% stenosis.  There are no other flow-limiting lesions      noted in the right coronary artery.  This appears to be a      codominant system.  5. Left ventricular angiogram was performed in the RAO projection and      shows global left ventricular systolic dysfunction with an ejection      fraction of 20%.    IMPRESSION:  1. Mild nonobstructive coronary artery disease.  2. Global left ventricular systolic dysfunction.  3. Normal right-sided filling pressures.    RECOMMENDATIONS:  I recommend continued medical management.  The patient  has already been started on ACE inhibitor and a beta-blocker.  With his  mild nonobstructive disease, he should also be managed in the future on  a statin agent.  I will leave this to his primary cardiologist to change  his medical therapy at this time.        Echo 10/14/2014 LV EF: 25% -   30%   -------------------------------------------------------------------  Indications:  I42.9 Cardiomyopathy.   -------------------------------------------------------------------  History:  PMH:  Acquired from the patient and from the patient&'s  chart. PMH:  Tachy-Brady Syndrome. LBBB. Peripheral Edema. CHF.  Risk factors:  Dyslipidemia.   -------------------------------------------------------------------  Study Conclusions   - Left ventricle: The cavity size was normal. Wall thickness was    normal. Systolic function was severely reduced. The estimated    ejection fraction was in the range of 25% to 30%. Diffuse    hypokinesis. The study is not technically sufficient to allow    evaluation of LV diastolic function.  - Mitral valve: Mildly thickened leaflets . There was moderate    regurgitation.  - Left atrium: Massively dilated at 62 ml/m2.  - Right ventricle: The cavity size was normal. Wall thickness was    normal.  Pacer wire or catheter noted in right ventricle. Systolic    function was normal.  - Right atrium: The atrium was normal in size. Pacer wire or    catheter noted in right atrium.  - Tricuspid valve: There was mild regurgitation.  - Pulmonary arteries: PA peak pressure: 25 mm Hg (S).  - Inferior vena cava: The vessel was dilated. The respirophasic    diameter changes were blunted (< 50%), consistent with elevated    central venous pressure.   Impressions:   - Compared to the prior study in 02/2014, there are no significant    changes.   Laboratory Data: High Sensitivity Troponin:   Recent Labs  Lab 12/02/23 1010  TROPONINIHS 28*      Chemistry Recent Labs  Lab 12/02/23 1010  NA 138  K 4.7  CL 105  CO2 23  GLUCOSE 105*  BUN 18  CREATININE 0.91  CALCIUM  9.5  GFRNONAA >60  ANIONGAP 10    Recent Labs  Lab 12/02/23 1010  PROT 6.8  ALBUMIN 3.9  AST 28  ALT 18  ALKPHOS 73  BILITOT 1.8*   Lipids No results for input(s): CHOL, TRIG, HDL, LABVLDL, LDLCALC, CHOLHDL in the last 168 hours. Hematology Recent Labs  Lab 12/02/23 1010  WBC 6.1  RBC 4.95  HGB 15.4  HCT 49.2  MCV 99.4  MCH 31.1  MCHC 31.3  RDW 13.7  PLT 160   Thyroid  No results for input(s): TSH, FREET4 in the last 168 hours. BNPNo results for input(s): BNP, PROBNP in the last 168 hours.  DDimer No results for input(s): DDIMER in the last 168 hours.  Radiology/Studies:  DG Chest Portable 1 View Result Date: 12/02/2023 CLINICAL DATA:  Dizziness EXAM: PORTABLE CHEST 1 VIEW COMPARISON:  11/19/2014 FINDINGS: Left AICD remains in place, unchanged. Heart and mediastinal contours are within normal limits. No focal opacities or effusions. No acute bony abnormality. Aortic atherosclerosis. IMPRESSION: No active cardiopulmonary disease. Electronically Signed   By: Franky Crease M.D.   On: 12/02/2023 13:00     Assessment and Plan: Symptomatic VT  - Recent device interrogation showed  multiple episode of VT below therapy zone however within detection zone.  Patient had another episode of nonsustained VT in the emergency room and had the same dizziness.  - Plan to admit to cardiology service send transfer to [facility] and be evaluated by EP service.  - Continue carvedilol  and digoxin .  Pending digoxin  level.  Potassium normal, pending magnesium  level.  Will discuss with MD to see if he wants the patient to start on IV amiodarone  prior to EP evaluation.  History of nonischemic cardiomyopathy s/p Abbott biventricular ICD - Last echocardiogram  2016 showed EF 25 to 30%.  Repeat echocardiogram to make sure EF did not drop lower given symptomatic VT  History of minimal CAD on previous cath 2010: 30 to 40% RCA disease in 2010, minimal luminal irregularities in other vessels, denies any recent chest pain.  Suspicion for significant ischemia relatively low.  Hypertension: On carvedilol .  Hyperlipidemia  Risk Assessment/Risk Scores:         Code Status: Full Code  Severity of Illness: The appropriate patient status for this patient is INPATIENT. Inpatient status is judged to be reasonable and necessary in order to provide the required intensity of service to ensure the patient's safety. The patient's presenting symptoms, physical exam findings, and initial radiographic and laboratory data in the context of their chronic comorbidities is felt to place them at high risk for further clinical deterioration. Furthermore, it is not anticipated that the patient will be medically stable for discharge from the hospital within 2 midnights of admission.   * I certify that at the point of admission it is my clinical judgment that the patient will require inpatient hospital care spanning beyond 2 midnights from the point of admission due to high intensity of service, high risk for further deterioration and high frequency of surveillance required.*  For questions or updates, please  contact [facility] Please consult [email or web address] for contact info under     Signed, Kierstan Auer, [place]  12/02/2023 1:38 PM

## 2023-12-02 NOTE — Progress Notes (Signed)
 Pharmacy consulted by Cardiology to start heparin  in 88 yo M with Afib and VT. Currently therapeutic on PTA warfarin with INR 2.4 this AM.  Plan: Pharmacy will follow daily INR and start IV heparin  once INR < 2.0  Bard Jeans, PharmD, BCPS [phone] 12/02/2023, 2:30 PM

## 2023-12-02 NOTE — Progress Notes (Signed)
 Patient arrived from [facility] ED via [facility] staff. IV Amio initiated, paged Cardiology.

## 2023-12-03 ENCOUNTER — Encounter (HOSPITAL_COMMUNITY): Payer: Self-pay | Admitting: Internal Medicine

## 2023-12-03 ENCOUNTER — Inpatient Hospital Stay (HOSPITAL_COMMUNITY)

## 2023-12-03 DIAGNOSIS — I428 Other cardiomyopathies: Secondary | ICD-10-CM

## 2023-12-03 LAB — ECHOCARDIOGRAM COMPLETE
AR max vel: 2.1 cm2
AV Area VTI: 2.16 cm2
AV Area mean vel: 1.86 cm2
AV Mean grad: 3 mmHg
AV Peak grad: 4.2 mmHg
Ao pk vel: 1.02 m/s
Calc EF: 26.8 %
Est EF: 25
Height: 68 in
MV M vel: 4.55 m/s
MV Peak grad: 82.8 mmHg
Radius: 0.4 cm
S' Lateral: 6.2 cm
Single Plane A2C EF: 24.9 %
Single Plane A4C EF: 28 %
Weight: 2765.45 [oz_av]

## 2023-12-03 LAB — BASIC METABOLIC PANEL WITH GFR
Anion gap: 9 (ref 5–15)
BUN: 14 mg/dL (ref 8–23)
CO2: 24 mmol/L (ref 22–32)
Calcium: 9 mg/dL (ref 8.9–10.3)
Chloride: 104 mmol/L (ref 98–111)
Creatinine, Ser: 0.88 mg/dL (ref 0.61–1.24)
GFR, Estimated: >60 mL/min (ref >60)
Glucose, Bld: 84 mg/dL (ref 70–99)
Potassium: 3.9 mmol/L (ref 3.5–5.1)
Sodium: 137 mmol/L (ref 135–145)

## 2023-12-03 LAB — CBC
HCT: 44 % (ref 39.0–52.0)
Hemoglobin: 14.7 g/dL (ref 13.0–17.0)
MCH: 32.7 pg (ref 26.0–34.0)
MCHC: 33.4 g/dL (ref 30.0–36.0)
MCV: 97.8 fL (ref 80.0–100.0)
Platelets: 130 K/uL — ABNORMAL LOW (ref 150–400)
RBC: 4.5 MIL/uL (ref 4.22–5.81)
RDW: 13.8 % (ref 11.5–15.5)
WBC: 5.4 K/uL (ref 4.0–10.5)
nRBC: 0 % (ref 0.0–0.2)

## 2023-12-03 LAB — MAGNESIUM: Magnesium: 2 mg/dL (ref 1.7–2.4)

## 2023-12-03 LAB — PROTIME-INR
INR: 2.5 — ABNORMAL HIGH (ref 0.8–1.2)
Prothrombin Time: 28.3 s — ABNORMAL HIGH (ref 11.4–15.2)

## 2023-12-03 NOTE — Progress Notes (Signed)
 Echo shows EF 25%, similar to prior.   Will make NPO at midnight in the event cath is recommended, but suspect that VT is in the setting of progressive NICM given minimal troponin leak, and we will not need to proceed unless he has further despite amio.  That being said, his INR is being allowed to drift down.   Will discuss with Dr Waddell in the am.  Ozell "Shery Passey, PA-C

## 2023-12-03 NOTE — TOC CM/SW Note (Signed)
 Transition of Care [facility]) - Inpatient Brief Assessment   Patient Details  Name: Andrew Vance MRN: [id number] Date of Birth: 1936/03/22  [facility] of [facility] CM/SW Contact:    Waddell Barnie Rama, RN Phone Number: 12/03/2023, 2:49 PM   Clinical Narrative: From home with spouse, has PCP and insurance on file, states has no HH services in place at this time or DME at home.  States family member (wife)  will transport them home at [employer] and family is support system, states gets medications from [facility] on [address]  Pta self ambulatory.   There are no IP CM  needs identified  at this time.  Please place consult for IP CM needs.     Transition of Care Asessment: Insurance and Status: Insurance coverage has been reviewed Patient has primary care physician: Yes Home environment has been reviewed: home with wife Prior level of function:: indep Prior/Current Home Services: No current home services Social Drivers of Health Review: SDOH reviewed no interventions necessary Readmission risk has been reviewed: Yes Transition of care needs: no transition of care needs at this time

## 2023-12-03 NOTE — Telephone Encounter (Signed)
 Patient went to ER>

## 2023-12-03 NOTE — Progress Notes (Signed)
 ANTICOAGULATION CONSULT NOTE  Pharmacy Consult for PTA warfarin > heparin  Indication: chest pain/ACS  Allergies  Allergen Reactions   Zithromax  [Azithromycin ] Other (See Comments)    Affects Coumadin  levels     Patient Measurements: Height: 5' 8 (172.7 cm) Weight: 78.4 kg (172 lb 13.5 oz) IBW/kg (Calculated) : 68.4 Heparin  Dosing Weight: 78 kg   Vital Signs: Temp: 97.8 F (36.6 C) (08/05 0723) Temp Source: Oral (08/05 0723) BP: 145/93 (08/05 0723) Pulse Rate: 73 (08/05 0723)  Labs: Recent Labs    12/02/23 1010 12/02/23 1023 12/02/23 1331 12/03/23 0340  HGB 15.4  --   --  14.7  HCT 49.2  --   --  44.0  PLT 160  --   --  130*  LABPROT  --  27.4*  --  28.3*  INR  --  2.4*  --  2.5*  CREATININE 0.91  --   --  0.88  TROPONINIHS 28*  --  30*  --     Estimated Creatinine Clearance: 59.4 mL/min (by C-G formula based on SCr of 0.88 mg/dL).  Medical History: Past Medical History:  Diagnosis Date   AICD (automatic cardioverter/defibrillator) present    Anemia, iron deficiency    hx   Arthritis    all over   Atrial fibrillation [facility])    sees Dr. 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 Elsie Chol    Tachycardia-bradycardia syndrome [facility])     Medications:  See [facility]  Assessment: 88 yo Vance presents with presents with symptomatic VT on warfarin PTA for history of Afib.  Pharmacy consulted for heparin  dosing.  PTA warfarin = 2.5 mg Sun, 5 mg all other days per 11/27/23 anticoag visit (INR 2.7; not interested in DOAC in 12/2019)  INR 2.5 - therapeutic on PTA warfarin.  CBC stable.   Goal of Therapy:  Heparin  level 0.3-0.7 units/ml Monitor platelets  by anticoagulation protocol: Yes   Plan:  Daily INR, CBC - start heparin  when INR <2  Rad Gramling, PharmD PGY-1 Pharmacy Resident [facility]  12/03/2023 10:00 AM

## 2023-12-03 NOTE — Progress Notes (Cosign Needed Addendum)
  Patient Name: Andrew Vance Date of Encounter: 12/03/2023  Primary Cardiologist: None Electrophysiologist: Danelle Birmingham, MD  Interval Summary   Still having NSVT, but runs are shorted. Has lightheadedness when they occur. Otherwise no complaints.   Vital Signs    Vitals:   12/02/23 2345 12/03/23 0500 12/03/23 0538 12/03/23 0723  BP: (!) 148/83   (!) 145/93  Pulse: 78 75  73  Resp: 16 15  19   Temp: 97.8 F (36.6 C) 98.5 F (36.9 C)  97.8 F (36.6 C)  TempSrc: Oral Oral  Oral  SpO2: 99% 99%  99%  Weight:   78.4 kg   Height:        Intake/Output Summary (Last 24 hours) at 12/03/2023 0845 Last data filed at 12/03/2023 0522 Gross per 24 hour  Intake 240 ml  Output 500 ml  Net -260 ml   Filed Weights   12/02/23 1009 12/03/23 0538  Weight: 78 kg 78.4 kg    Physical Exam    GEN- NAD, Alert and oriented  Lungs- Clear to ausculation bilaterally, normal work of breathing Cardiac- Regular rate and rhythm, no murmurs, rubs or gallops GI- soft, NT, ND, + BS Extremities- no clubbing or cyanosis. No edema  Telemetry    NSR at 75 bpm with occasional runs of NSVT (personally reviewed)  Hospital Course    Andrew Vance is a 88 y.o. male with a history of permanent AF, symptomatic bradycardia s/p PPM, NICM, CHF s/p BIV upgrade who is being seen today for the evaluation of symptomatic VT at the request of Dr. Mona.   Assessment & Plan     VT 160-180s and symptomatic with near syncope Update Echo, previously 25-30% Continue IV amiodarone  today with on-going NSVT Potassium3.9 (08/05 0340) Magnesium   2.0 (08/05 0340) Creatinine, ser  0.88 (08/05 0340) Keep K > 4.0 and Mg > 2.0    Permanent AF For heparin  once INR <2.0 in case cath needed. On coumadin  at home.    CAD Non obstructive by prior cath If EF significantly down or significant troponin consider cath    For questions or updates, please contact [facility] Please consult [email or web address] for contact  info under     Signed, Ozell Prentice Passey, PA-C  12/03/2023, 8:45 AM    I have seen, examined the patient, and reviewed the above assessment and plan.    Interval:  No acute overnight events. Patient reports feeling relatively well. Still some brief NSVT episodes accompanied by lightheadedness. No new or acute complaints.   General: Well developed, in no acute distress.  Neck: No JVD.  Cardiac: Normal rate, regular rhythm.  Resp: Normal work of breathing.  Ext: No edema.  Neuro: No gross focal deficits.  Psych: Normal affect.   Assessment:  #. VT: Likely scar mediated in setting of NICM.  Does not appear significantly volume overloaded and no anginal symptoms.  Did not receive device therapies. #. Near syncope - Load with IV amiodarone . Transition to oral tomorrow. - Repeat echocardiogram with no significant change in LVEF.   #. Permanent AF #. Secondary hypercoagulable state due to AF:  - IV heparin  for anti-coagulation.   #. Chronic systolic heart failure: Appears well compensated.  #. NICM - Repeat echocardiogram with no significant change in LVEF. - Management per cardiology   Fonda Kitty, MD, [facility], [facility] Cardiac Electrophysiology

## 2023-12-04 ENCOUNTER — Other Ambulatory Visit (HOSPITAL_COMMUNITY): Payer: Self-pay

## 2023-12-04 LAB — BASIC METABOLIC PANEL WITH GFR
Anion gap: 10 (ref 5–15)
BUN: 15 mg/dL (ref 8–23)
CO2: 25 mmol/L (ref 22–32)
Calcium: 9.2 mg/dL (ref 8.9–10.3)
Chloride: 103 mmol/L (ref 98–111)
Creatinine, Ser: 0.93 mg/dL (ref 0.61–1.24)
GFR, Estimated: >60 mL/min (ref >60)
Glucose, Bld: 97 mg/dL (ref 70–99)
Potassium: 3.9 mmol/L (ref 3.5–5.1)
Sodium: 138 mmol/L (ref 135–145)

## 2023-12-04 LAB — CBC
HCT: 43.9 % (ref 39.0–52.0)
Hemoglobin: 14.6 g/dL (ref 13.0–17.0)
MCH: 32.4 pg (ref 26.0–34.0)
MCHC: 33.3 g/dL (ref 30.0–36.0)
MCV: 97.6 fL (ref 80.0–100.0)
Platelets: 130 K/uL — ABNORMAL LOW (ref 150–400)
RBC: 4.5 MIL/uL (ref 4.22–5.81)
RDW: 13.7 % (ref 11.5–15.5)
WBC: 6 K/uL (ref 4.0–10.5)
nRBC: 0 % (ref 0.0–0.2)

## 2023-12-04 LAB — PROTIME-INR
INR: 1.9 — ABNORMAL HIGH (ref 0.8–1.2)
Prothrombin Time: 23 s — ABNORMAL HIGH (ref 11.4–15.2)

## 2023-12-04 LAB — MAGNESIUM: Magnesium: 2 mg/dL (ref 1.7–2.4)

## 2023-12-04 MED ORDER — AMIODARONE HCL 200 MG PO TABS
400.0000 mg | ORAL_TABLET | Freq: Every day | ORAL | 6 refills | Status: DC
  Filled 2023-12-04: qty 60, 30d supply, fill #0

## 2023-12-04 MED ORDER — HEPARIN (PORCINE) 25000 UT/250ML-% IV SOLN: 950.0000 [IU]/h | INTRAVENOUS | Status: DC

## 2023-12-04 MED ORDER — APIXABAN 5 MG PO TABS
5.0000 mg | ORAL_TABLET | Freq: Two times a day (BID) | ORAL | 6 refills | Status: DC
  Filled 2023-12-04: qty 60, 30d supply, fill #0

## 2023-12-04 MED ORDER — AMIODARONE HCL 200 MG PO TABS
400.0000 mg | ORAL_TABLET | Freq: Every day | ORAL | Status: DC
  Administered 2023-12-04: 400 mg via ORAL
  Filled 2023-12-04: qty 2

## 2023-12-04 MED ORDER — APIXABAN 5 MG PO TABS: 5.0000 mg | ORAL_TABLET | Freq: Two times a day (BID) | ORAL | Status: DC

## 2023-12-04 NOTE — Progress Notes (Signed)
 PHARMACY - ANTICOAGULATION CONSULT NOTE  Pharmacy Consult for Andrew Vance  Indication: atrial fibrillation  Allergies  Allergen Reactions   Zithromax  [Azithromycin ] Other (See Comments)    Affects Coumadin  levels     Patient Measurements: Height: 5' 8 (172.7 cm) Weight: 78 kg (172 lb) IBW/kg (Calculated) : 68.4 HEPARIN  DW (KG): 78  Vital Signs: Temp: 98 F (36.7 C) (08/06 0731) Temp Source: Oral (08/06 0731) BP: 129/95 (08/06 0731) Pulse Rate: 85 (08/06 0731)  Labs: Recent Labs    12/02/23 1010 12/02/23 1023 12/02/23 1331 12/03/23 0340 12/04/23 0302  HGB 15.4  --   --  14.7 14.6  HCT 49.2  --   --  44.0 43.9  PLT 160  --   --  130* 130*  LABPROT  --  27.4*  --  28.3* 23.0*  INR  --  2.4*  --  2.5* 1.9*  CREATININE 0.91  --   --  0.88 0.93  TROPONINIHS 28*  --  30*  --   --     Estimated Creatinine Clearance: 56.2 mL/min (by C-G formula based on SCr of 0.93 mg/dL).   Medical History: Past Medical History:  Diagnosis Date   AICD (automatic cardioverter/defibrillator) present    Anemia, iron deficiency    hx   Arthritis    all over   Atrial fibrillation [facility])    sees Dr. 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 Elsie Chol    Tachycardia-bradycardia syndrome [facility])     Medications:  Medications Prior to Admission  Medication Sig Dispense Refill Last Dose/Taking   acetaminophen  (TYLENOL ) 500 MG tablet Take 1,000 mg by mouth every 6 (six) hours as needed for moderate pain (pain score 4-6).   Past Month   amoxicillin  (AMOXIL ) 500 MG capsule Take 2,000 mg by mouth See admin instructions. Take 2000 mg 1 hour prior to dental work   Past Week    carvedilol  (COREG ) 6.25 MG tablet TAKE 1.5 TABLETS (9.375 MG TOTAL) BY MOUTH 2 (TWO) TIMES DAILY WITH A MEAL. (Patient taking differently: Take 6.25 mg by mouth 2 (two) times daily with a meal.) 270 tablet 1 12/02/2023   digoxin  (LANOXIN ) 0.125 MG tablet TAKE 1 TABLET BY MOUTH EVERY DAY (Patient taking differently: Take 125 mcg by mouth at bedtime.) 90 tablet 3 Past Week   fluocinonide (LIDEX) 0.05 % external solution Apply 1 application  topically 2 (two) times daily.   Past Week   hydrocortisone 2.5 % lotion Apply 1 Application topically 2 (two) times daily.   12/02/2023   Lutein 20 MG CAPS Take 20 mg by mouth at bedtime.    Past Week   rosuvastatin  (CRESTOR ) 10 MG tablet Take 1 tablet (10 mg total) by mouth daily. (Patient taking differently: Take 10 mg by mouth at bedtime.) 90 tablet 3 Past Week   warfarin (COUMADIN ) 5 MG tablet TAKE 1 TABLET BY MOUTH DAILY OR AS DIRECTED BY COUMADIN  CLINIC (Patient taking differently: Take 2.5-5 mg by mouth See admin instructions. Take 5mg  (1 tablet) by mouth daily except on Sunday's, take 2.5mg  (1/2  tablet)) 100 tablet 0 12/02/2023 at  6:00 AM    Assessment: 88y.o. male admitted for chest pain/ACS. Was on warfarin PTA and transitioned to heparin  once INR fell below 2. 8/6 AM, INR was 1.9 and heparin  initiation was ordered. Patient did not receive any heparin  before plan was decided for no cath. Patient to now be transitioned to Andrew Vance .   Spoke to patient on 8/5 and he was agreeable to starting DOAC. Was okay with price of $47.  Age 88, Scr 0.93, Wt 78kg-- 5mg  dose appropriate   Goal of Therapy:  Monitor platelets by anticoagulation protocol: Yes   Plan:  Andrew Vance  5mg  BID Monitor H&H, plts, signs and symptoms of bleeding  Andrew Vance Andrew Vance 12/04/2023,9:18 AM

## 2023-12-04 NOTE — TOC Progression Note (Signed)
 Transition of Care [facility]) - Progression Note    Patient Details  Name: Andrew Vance MRN: [id number] Date of Birth: 10/06/1935  Transition of Care [facility]) CM/SW Contact  Waddell Barnie Rama, RN Phone Number: 12/04/2023, 10:27 AM  Clinical Narrative:    Patient will be on eliquis , co pay will be 47.00 per pharmacy claim test.                     Expected Discharge Plan and Services                                               Social Drivers of Health (SDOH) Interventions SDOH Screenings   Food Insecurity: No Food Insecurity (12/02/2023)  Housing: Low Risk  (12/02/2023)  Transportation Needs: No Transportation Needs (12/02/2023)  Utilities: Not At Risk (12/02/2023)  Alcohol  Screen: Low Risk  (04/04/2023)  Depression (PHQ2-9): Low Risk  (04/05/2021)  Financial Resource Strain: Low Risk  (04/04/2023)  Physical Activity: Unknown (04/04/2023)  Social Connections: Socially Isolated (12/02/2023)  Stress: No Stress Concern Present (04/04/2023)  Tobacco Use: Medium Risk (12/03/2023)    Readmission Risk Interventions    12/03/2023    2:48 PM  Readmission Risk Prevention Plan  Medication Screening Complete  Transportation Screening Complete

## 2023-12-04 NOTE — Discharge Summary (Addendum)
 ELECTROPHYSIOLOGY DISCHARGE SUMMARY    Patient ID: Andrew Vance,  MRN: [id number], DOB/AGE: October 23, 1935 88 y.o.  Admit date: 12/02/2023 Discharge date: 12/04/2023  Primary Care Physician: [name] DELENA, MD  Primary Cardiologist: None  Electrophysiologist: Dr. Waddell   Primary Discharge Diagnosis:  Monomorphic VT Chronic systolic CHF  Secondary Discharge Diagnosis:  NICM Permanent AF Non-obstructive CAD  Procedures This Admission:  Echo 12/03/2023 showed LVEF 25%, severe LAE, mild MR    Brief HPI: Andrew Vance is a 88 y.o. male with a history of  minimal CAD on cath 2010, NICM s/p Abbott BiV ICD (recent device change out), A-fib, hypertension, hyperlipidemia, LBBB and a history of symptomatic bradycardia admitted for symptomatic VT.   Hospital Course:  The patient was admitted with lightheadedness and found to have VT on his device below his detection. He was started on IV amiodarone  which gradually improved his NSVT burden. They were monitored on telemetry overnight which demonstrated NSR with no further sustained VT. Device was adjusted to treat VT >166 bpm with 6 burst, 6 ramp, and 2 shocks if continues.  The patient was examined and considered to be stable for discharge.  The patient will be seen back by Dr. Waddell in 3-4 weeks for post hospital care as scheduled.  Discussed with Pharmacy and pt willing to change from coumadin  to Eliquis . Will STOP coumadin  on discharge and start eliquis  due for dose tonight.   Physical Exam: Vitals:   12/04/23 0329 12/04/23 0451 12/04/23 0731 12/04/23 1100  BP: 136/85  (!) 129/95 113/71  Pulse:   85 87  Resp: 20  20 20   Temp: 98.6 F (37 C)  98 F (36.7 C) 97.7 F (36.5 C)  TempSrc: Oral  Oral Oral  SpO2: 98%  98% 99%  Weight:  78 kg    Height:        GEN- NAD. A&O x 3.  HEENT: Normocephalic, atraumatic Lungs- CTAB, Normal effort.  Heart- RRR, No M/G/R.  GI- Soft, NT, ND.  Extremities- No clubbing, cyanosis, or edema;  Groin without hematoma   Discharge Medications:  Allergies as of 12/04/2023       Reactions   Zithromax  [azithromycin ] Other (See Comments)   Affects Coumadin  levels         Medication List     STOP taking these medications    warfarin 5 MG tablet Commonly known as: COUMADIN        TAKE these medications    acetaminophen  500 MG tablet Commonly known as: TYLENOL  Take 1,000 mg by mouth every 6 (six) hours as needed for moderate pain (pain score 4-6).   amiodarone  200 MG tablet Commonly known as: PACERONE  Take 2 tablets (400 mg total) by mouth daily. Start taking on: December 05, 2023   amoxicillin  500 MG capsule Commonly known as: AMOXIL  Take 2,000 mg by mouth See admin instructions. Take 2000 mg 1 hour prior to dental work   apixaban  5 MG Tabs tablet Commonly known as: ELIQUIS  Take 1 tablet (5 mg total) by mouth 2 (two) times daily.   carvedilol  6.25 MG tablet Commonly known as: COREG  TAKE 1.5 TABLETS (9.375 MG TOTAL) BY MOUTH 2 (TWO) TIMES DAILY WITH A MEAL. What changed: how much to take   digoxin  0.125 MG tablet Commonly known as: LANOXIN  TAKE 1 TABLET BY MOUTH EVERY DAY What changed: when to take this   fluocinonide 0.05 % external solution Commonly known as: LIDEX Apply 1 application  topically 2 (two) times daily.  hydrocortisone 2.5 % lotion Apply 1 Application topically 2 (two) times daily.   Lutein 20 MG Caps Take 20 mg by mouth at bedtime.   rosuvastatin  10 MG tablet Commonly known as: CRESTOR  Take 1 tablet (10 mg total) by mouth daily. What changed: when to take this        Disposition: Home with usual follow up as in AVS  Duration of Discharge Encounter:  APP time: 25 minutes  Signed, Ozell Prentice Passey, PA-C  12/04/2023 1:54 PM  EP Attending  Patient seen and examined. On exam he has a RRR and lungs are clear and CV with trace edema. Tele with afib and ventricular pacing. No VT A/P Recurrent long runs of NSVT - He is quite  on amio IV. I have recommended he stop IV amio, switch to 400 mg daily, for about a month, and then plan to stop amiodarone . Chronic systolic heart failure - his symptoms are class 2. He will continue GDMT. ICD - his device has been reprogrammed with multiple ATP and ICD shock therapy at VT rates of 160-190.  Danelle Kimberla Driskill,MD

## 2023-12-04 NOTE — Discharge Instructions (Signed)
Information on my medicine - ELIQUIS? (apixaban) ? ?This medication education was reviewed with me or my healthcare representative as part of my discharge preparation.  ? ?Why was Eliquis? prescribed for you? ?Eliquis? was prescribed for you to reduce the risk of forming blood clots that can cause a stroke if you have a medical condition called atrial fibrillation (a type of irregular heartbeat) OR to reduce the risk of a blood clots forming after orthopedic surgery. ? ?What do You need to know about Eliquis? ? ?Take your Eliquis? TWICE DAILY - one tablet in the morning and one tablet in the evening with or without food.  It would be best to take the doses about the same time each day. ? ?If you have difficulty swallowing the tablet whole please discuss with your pharmacist how to take the medication safely. ? ?Take Eliquis? exactly as prescribed by your doctor and DO NOT stop taking Eliquis? without talking to the doctor who prescribed the medication.  Stopping may increase your risk of developing a new clot or stroke.  Refill your prescription before you run out. ? ?After discharge, you should have regular check-up appointments with your healthcare provider that is prescribing your Eliquis?.  In the future your dose may need to be changed if your kidney function or weight changes by a significant amount or as you get older. ? ?What do you do if you miss a dose? ?If you miss a dose, take it as soon as you remember on the same day and resume taking twice daily.  Do not take more than one dose of ELIQUIS at the same time. ? ?Important Safety Information ?A possible side effect of Eliquis? is bleeding. You should call your healthcare provider right away if you experience any of the following: ?Bleeding from an injury or your nose that does not stop. ?Unusual colored urine (red or dark brown) or unusual colored stools (red or black). ?Unusual bruising for unknown reasons. ?A serious fall or if you hit your head (even  if there is no bleeding). ? ?Some medicines may interact with Eliquis? and might increase your risk of bleeding or clotting while on Eliquis?. To help avoid this, consult your healthcare provider or pharmacist prior to using any new prescription or non-prescription medications, including herbals, vitamins, non-steroidal anti-inflammatory drugs (NSAIDs) and supplements. ? ?This website has more information on Eliquis? (apixaban): http://www.eliquis.com/eliquis/home ?  ?

## 2023-12-04 NOTE — Progress Notes (Signed)
 ANTICOAGULATION CONSULT NOTE  Pharmacy Consult for PTA warfarin > heparin  Indication: chest pain/ACS  Allergies  Allergen Reactions   Zithromax  [Azithromycin ] Other (See Comments)    Affects Coumadin  levels     Patient Measurements: Height: 5' 8 (172.7 cm) Weight: 78 kg (172 lb) IBW/kg (Calculated) : 68.4 Heparin  Dosing Weight: 78 kg   Vital Signs: Temp: 98.6 F (37 C) (08/06 0329) Temp Source: Oral (08/06 0329) BP: 136/85 (08/06 0329) Pulse Rate: 86 (08/06 0004)  Labs: Recent Labs    12/02/23 1010 12/02/23 1023 12/02/23 1331 12/03/23 0340 12/04/23 0302  HGB 15.4  --   --  14.7 14.6  HCT 49.2  --   --  44.0 43.9  PLT 160  --   --  130* 130*  LABPROT  --  27.4*  --  28.3* 23.0*  INR  --  2.4*  --  2.5* 1.9*  CREATININE 0.91  --   --  0.88 0.93  TROPONINIHS 28*  --  30*  --   --     Estimated Creatinine Clearance: 56.2 mL/min (by C-G formula based on SCr of 0.93 mg/dL).  Medical History: Past Medical History:  Diagnosis Date   AICD (automatic cardioverter/defibrillator) present    Anemia, iron deficiency    hx   Arthritis    all over   Atrial fibrillation [facility])    sees Dr. 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 Elsie Chol    Tachycardia-bradycardia syndrome [facility])     Medications:  See [facility]  Assessment: 88 yo male presents with presents with symptomatic VT on warfarin PTA for history of Afib.  Pharmacy consulted for heparin  dosing for ACS/STEMI.  PTA warfarin = 2.5 mg Sun, 5 mg all other days per 11/27/23 anticoag visit (INR 2.7; not interested in DOAC in 12/2019)  INR down to 1.9, okay to start heparin  now. Forego bolus since already  therapeutically anticoagulated. Will start with 12 units/kg/hr infusion rate.  Goal of Therapy:  Heparin  level 0.3-0.7 units/ml Monitor platelets by anticoagulation protocol: Yes   Plan:  Start heparin  infusion at 950 units/hr. Check heparin  level in 8 hours Daily heparin  level and CBC while on heparin  Monitor H&H, plts, and signs/symptoms of bleeding  Keshanna Riso, PharmD PGY-1 Pharmacy Resident [facility]  12/04/2023 7:26 AM

## 2023-12-04 NOTE — TOC Transition Note (Signed)
 Transition of Care [facility]) - Discharge Note   Patient Details  Name: Andrew Vance MRN: [id number] Date of Birth: Sep 16, 1935  Transition of Care [facility]) CM/SW Contact:  Waddell Barnie Rama, RN Phone Number: 12/04/2023, 2:39 PM   Clinical Narrative:    For dc today, he has transport at dc. No needs.         Patient Goals and CMS Choice            Discharge Placement                       Discharge Plan and Services Additional resources added to the After Visit Summary for                                       Social Drivers of Health (SDOH) Interventions SDOH Screenings   Food Insecurity: No Food Insecurity (12/02/2023)  Housing: Low Risk  (12/02/2023)  Transportation Needs: No Transportation Needs (12/02/2023)  Utilities: Not At Risk (12/02/2023)  Alcohol  Screen: Low Risk  (04/04/2023)  Depression (PHQ2-9): Low Risk  (04/05/2021)  Financial Resource Strain: Low Risk  (04/04/2023)  Physical Activity: Unknown (04/04/2023)  Social Connections: Socially Isolated (12/02/2023)  Stress: No Stress Concern Present (04/04/2023)  Tobacco Use: Medium Risk (12/03/2023)     Readmission Risk Interventions    12/03/2023    2:48 PM  Readmission Risk Prevention Plan  Medication Screening Complete  Transportation Screening Complete

## 2023-12-04 NOTE — Progress Notes (Signed)
  Dr. Waddell has seen this am.   No plans for cath, ok for diet.   Transition amiodarone  to po and home this afternoon if remains stable.   Plan to program device for new tachy zone starting around 160s. Will ask industry to assist.   Full note pending disposition.   Ozell Jodie Passey, PA-C  12/04/2023 8:47 AM

## 2023-12-05 ENCOUNTER — Telehealth: Payer: Self-pay

## 2023-12-05 NOTE — Transitions of Care (Post Inpatient/ED Visit) (Signed)
 Today's TOC FU Call Status: Today's TOC FU Call Status:: Successful TOC FU Call Completed Patient's Name and Date of Birth confirmed.  Transition Care Management Follow-up Telephone Call Discharge Facility: [facility]) Type of Discharge: Inpatient Admission How have you been since you were released from the hospital?: Better Any questions or concerns?: No  Items Reviewed: Did you receive and understand the discharge instructions provided?: Yes Medications obtained,verified, and reconciled?: Yes (Medications Reviewed) Any new allergies since your discharge?: No Dietary orders reviewed?: Yes Type of Diet Ordered:: Low Sodium Hearth Healthy Do you have support at home?: Yes People in Home [RPT]: spouse Name of Support/Comfort Primary Source: Mesick,[name] (Spouse)  [phone] (Mobile)  Medications Reviewed Today: Medications Reviewed Today     Reviewed by Carolee Heron NOVAK, RN (Case Manager) on 12/05/23 at 1238  Med List Status: <None>   Medication Order Taking? Sig Documenting Provider Last Dose Status Informant  acetaminophen  (TYLENOL ) 500 MG tablet [id number] Yes Take 1,000 mg by mouth every 6 (six) hours as needed for moderate pain (pain score 4-6). [provider]  Active Self, Pharmacy Records  amiodarone  (PACERONE ) 200 MG tablet [id number] Yes Take 2 tablets (400 mg total) by mouth daily. Lesia Ozell Barter, PA-C  Active   amoxicillin  (AMOXIL ) 500 MG capsule [id number] Yes Take 2,000 mg by mouth See admin instructions. Take 2000 mg 1 hour prior to dental work [provider]  Active Self, Pharmacy Records  apixaban  (ELIQUIS ) 5 MG TABS tablet [id number] Yes Take 1 tablet (5 mg total) by mouth 2 (two) times daily. Lesia Ozell Barter, PA-C  Active   carvedilol  (COREG ) 6.25 MG tablet [id number] Yes TAKE 1.5 TABLETS (9.375 MG TOTAL) BY MOUTH 2 (TWO) TIMES DAILY WITH A MEAL. Waddell Danelle ORN, MD  Active Self, Pharmacy Records           Med Note JACKOLYN WADDELL DEL   Tue Dec 03, 2023  9:44 AM) Patient confirms 1BID, not 1.5BID.  digoxin  (LANOXIN ) 0.125 MG tablet [id number] Yes TAKE 1 TABLET BY MOUTH EVERY DAY  Patient taking differently: Take 125 mcg by mouth at bedtime.   Waddell Danelle ORN, MD  Active Self, Pharmacy Records  fluocinonide (LIDEX) 0.05 % external solution [id number] Yes Apply 1 application  topically 2 (two) times daily.  Patient taking differently: Apply 1 application  topically 2 (two) times daily. Use as needed   [provider]  Active Self, Pharmacy Records  hydrocortisone 2.5 % lotion [id number] Yes Apply 1 Application topically 2 (two) times daily. [provider]  Active Self, Pharmacy Records  Lutein 20 MG CAPS [id number] Yes Take 20 mg by mouth at bedtime.  [provider]  Active Self, Pharmacy Records  rosuvastatin  (CRESTOR ) 10 MG tablet [id number] Yes Take 1 tablet (10 mg total) by mouth daily.  Patient taking differently: Take 10 mg by mouth at bedtime.   [name] LABOR, MD  Active Self, Pharmacy Records            Home Care and Equipment/Supplies: Were Home Health Services Ordered?: No Any new equipment or medical supplies ordered?: No  Functional Questionnaire: Do you need assistance with bathing/showering or dressing?: No Do you need assistance with meal preparation?: No Do you need assistance with eating?: No Do you have difficulty maintaining continence: No (Frequency at night) Do you need assistance with getting out of bed/getting out of a chair/moving?: No Do you have difficulty managing or taking your medications?: No  Follow up appointments reviewed: PCP Follow-up  appointment confirmed?: Yes Date of PCP follow-up appointment?: 12/09/23 Follow-up Provider: Dr Garnette Reasoner Specialist [facility] Follow-up appointment confirmed?: Yes Date of Specialist follow-up appointment?: 12/18/23 (and 12/25/23 MD Defib CHeck) Follow-Up Specialty Provider:: Dr Waddell Do you need transportation  to your follow-up appointment?: No Do you understand care options if your condition(s) worsen?: Yes-patient verbalized understanding  SDOH Interventions Today    Flowsheet Row Most Recent Value  SDOH Interventions   Food Insecurity Interventions Intervention Not Indicated  Housing Interventions Intervention Not Indicated  Transportation Interventions Intervention Not Indicated, Patient Resources (Friends/Family)  Utilities Interventions Intervention Not Indicated  Health Literacy Interventions Intervention Not Indicated    Goals Addressed             This Visit's Progress    [facility]BCI Transitions of Care (TOC) Care Plan       Problems:  Recent Hospitalization for treatment of CHF and ventricular tachycardia, CAD, Permanent ICD(Abbot BiV ICD).  Knowledge Deficit Related to Chronic systolic heart failure, Ventricular Tachycardia, ICD device VT detection.   Goal:  Over the next 30 days, the patient will not experience hospital readmission  Interventions:  Transitions of Care: Reviewed all discharge instructions, medications, allergies, SDOH review, functional ADL's, assessment of systems, anxiety and depression screening.  Annual Eye Exam Reviewed and patient has exams twice a year for history of glaucoma and cataracts, wear glasses. 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  Doctor Visits  - discussed the importance of doctor visits Patient has PCP HFU on 12/09/23 Cardiology EP follow ups on 8/20 and  12/25/2023 for DEFIB checks post adjustments in hospitalization.    CAD Interventions: Assessed understanding of CAD diagnosis Medications reviewed including medications utilized in CAD treatment plan Provided education on importance of blood pressure control in management of CAD Reviewed Importance of taking all medications as  prescribed Reviewed Importance of attending all scheduled provider appointments Screening for signs and symptoms of depression related to chronic disease state Discussed/Reviewed reason for hospitalization, changes made to ICD:  ICD Heart rate now set to not be allowed to go below 85-86 per patient stated.     Heart Failure Interventions: Assessed need for readable accurate scales in home Provided education about placing scale on hard, flat surface Advised patient to weigh each morning after emptying bladder Discussed importance of daily weight and advised patient to weigh and record daily. Yesterday's weight patient reported: 172 lbs.  Reviewed discharge heart failure instructions with patient.  Discussed the importance of keeping all appointments with provider Screening for signs and symptoms of depression related to chronic disease state  Assessed social determinant of health barriers   Patient Self Care Activities:  Attend all scheduled provider appointments Call pharmacy for medication refills 3-7 days in advance of running out of medications Call provider office for new concerns or questions  Notify RN Care Manager of TOC call rescheduling needs Participate in Transition of Care Program/Attend TOC scheduled calls Perform all self care activities independently  Take medications as prescribed   call office if I gain more than 2 pounds in one day or 5 pounds in one week track weight in diary use salt in moderation weigh myself daily know when to call the doctor: Reviewed discharge heart failure instructions and what to contact provider for, patient verbalized good understanding of self care management  Report any falls and/or bumps to the  head to provider or seek emergency help due to being on blood thinners. Patient verbalized understanding and that he was well versed  in those precautions   Report any usual feelings of change in heart rate, dizziness, light headed feelings,  weakness or not feeling well.   Plan:  An initial telephone outreach has been scheduled for: 12/12/2023 at 2 pm with Bing Edison RN CM  Next PCP appointment scheduled for: 12/09/2023.         8.7.25 TOC RN CM post discharge outreach: Outreach was successful and patient verbally agreed to participate in follow up weekly phone calls/30 day program post discharge.  Patient has PCP and specialist follow up appointments and no transportation issues with family assistance.  Medications reviewed and patient has good understanding of medication self management and includes spouse in back up management.  No SDOH changes or new Allergies noted on review.  Will follow up on 8/1/425 at 2 pm with patient on a weekly follow up call.  Patient knows how to contact insurance if needed for additional benefits if applicable/needed.  The patient has been provided with contact information for the care management team and has been advised to call with any health-related questions or concerns that are non-emergent.  The patient verbalized understanding with current POC and follow up call next week.  SBAR sent to PCP upon closing of this documentation.   Bing Edison MSN, RN RN Case Sales executive Health  [facility]Population [facility] Office Hours M-F [phone] Direct Dial: [phone] Main Phone [phone]  Fax: [phone] [email or web address]

## 2023-12-05 NOTE — Telephone Encounter (Signed)
 Patient was hospitalized and sent home for this.

## 2023-12-05 NOTE — Patient Instructions (Signed)
 Visit Information  Thank you for taking time to visit with me today. Please don't hesitate to contact me if I can be of assistance to you before our next scheduled telephone appointment.  Our next appointment is by telephone on 12/12/23  at 2pm  Following is a copy of your care plan:   Goals Addressed             This Visit's Progress    VBCI Transitions of Care (TOC) Care Plan       Problems:  Recent Hospitalization for treatment of CHF and ventricular tachycardia, CAD, Permanent ICD(Abbot BiV ICD).  Knowledge Deficit Related to Chronic systolic heart failure, Ventricular Tachycardia, ICD device VT detection.   Goal:  Over the next 30 days, the patient will not experience hospital readmission  Interventions:  Transitions of Care: Reviewed all discharge instructions, medications, allergies, SDOH review, functional ADL's, assessment of systems, anxiety and depression screening.  Annual Eye Exam Reviewed and patient has exams twice a year for history of glaucoma and cataracts, wear glasses. 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  Doctor Visits  - discussed the importance of doctor visits Patient has PCP HFU on 12/09/23 Cardiology EP follow ups on 8/20 and  12/25/2023 for DEFIB checks post adjustments in hospitalization.    CAD Interventions: Assessed understanding of CAD diagnosis Medications reviewed including medications utilized in CAD treatment plan Provided education on importance of blood pressure control in management of CAD Reviewed Importance of taking all medications as prescribed Reviewed Importance of attending all scheduled provider appointments Screening for signs and symptoms of depression related to chronic disease state Discussed/Reviewed reason for hospitalization, changes made to ICD:  ICD Heart rate  now set to not be allowed to go below 85-86 per patient stated.     Heart Failure Interventions: Assessed need for readable accurate scales in home Provided education about placing scale on hard, flat surface Advised patient to weigh each morning after emptying bladder Discussed importance of daily weight and advised patient to weigh and record daily. Yesterday's weight patient reported: 172 lbs.  Reviewed discharge heart failure instructions with patient.  Discussed the importance of keeping all appointments with provider Screening for signs and symptoms of depression related to chronic disease state  Assessed social determinant of health barriers   Patient Self Care Activities:  Attend all scheduled provider appointments Call pharmacy for medication refills 3-7 days in advance of running out of medications Call provider office for new concerns or questions  Notify RN Care Manager of TOC call rescheduling needs Participate in Transition of Care Program/Attend TOC scheduled calls Perform all self care activities independently  Take medications as prescribed   call office if I gain more than 2 pounds in one day or 5 pounds in one week track weight in diary use salt in moderation weigh myself daily know when to call the doctor:Reviewed discharge heart failure instructions and what to contact provider for, patient verbalized good understanding of self care management  Report any falls and/or bumps to the head to provider or seek emergency help due to being on blood thinners. Patient verbalized understanding and that he was well versed  in those precautions   Report any usual feelings of change in heart rate, dizziness, light headed feelings, weakness or not feeling well.   Plan:  An initial telephone outreach has been scheduled for:  12/12/2023 at 2 pm with Bing Edison RN CM  Next PCP appointment scheduled for: 12/09/2023.         Patient verbalizes understanding of instructions and  care plan provided today and agrees to view in MyChart. Active MyChart status and patient understanding of how to access instructions and care plan via MyChart confirmed with patient.     An initial telephone outreach has been scheduled for: 12/12/2023 at 2 pm.   Please call the care guide team at [phone] if you need to cancel or reschedule your appointment.   Please call the Suicide and Crisis Lifeline: 988 call the [place]  National Suicide Prevention Lifeline: 512-056-4938 or TTY: 226-169-9741 TTY 951 706 2898) to talk to a trained counselor call 1-800-273-TALK (toll free, 24 hour hotline) call 911 if you are experiencing a Mental Health or Behavioral Health Crisis or need someone to talk to.   Bing Edison MSN, RN RN Case Sales executive Health  [facility]Population Health Office Hours M-F [phone] Direct Dial: [phone] Main Phone [phone]  Fax: [phone] [email or web address]

## 2023-12-09 ENCOUNTER — Encounter: Payer: Self-pay | Admitting: Family Medicine

## 2023-12-09 ENCOUNTER — Ambulatory Visit: Admitting: Family Medicine

## 2023-12-09 VITALS — BP 102/60 | HR 85 | Temp 97.9°F | Wt 174.0 lb

## 2023-12-09 DIAGNOSIS — I429 Cardiomyopathy, unspecified: Secondary | ICD-10-CM

## 2023-12-09 DIAGNOSIS — I5022 Chronic systolic (congestive) heart failure: Secondary | ICD-10-CM

## 2023-12-09 DIAGNOSIS — I4819 Other persistent atrial fibrillation: Secondary | ICD-10-CM

## 2023-12-09 DIAGNOSIS — I4891 Unspecified atrial fibrillation: Secondary | ICD-10-CM

## 2023-12-09 DIAGNOSIS — Z8679 Personal history of other diseases of the circulatory system: Secondary | ICD-10-CM

## 2023-12-09 DIAGNOSIS — I472 Ventricular tachycardia, unspecified: Secondary | ICD-10-CM

## 2023-12-09 DIAGNOSIS — Z95810 Presence of automatic (implantable) cardiac defibrillator: Secondary | ICD-10-CM | POA: Diagnosis not present

## 2023-12-09 DIAGNOSIS — I495 Sick sinus syndrome: Secondary | ICD-10-CM

## 2023-12-09 NOTE — Progress Notes (Signed)
   Subjective:    Patient ID: Andrew Vance, male    DOB: 22-Oct-1935, 88 y.o.   MRN: [id number]  HPI Here with his wife to follow up a hospital stay from 12-02-23 to 12-04-23 for ventricular tachycardia on top of persistent atrial fibrillation. He presented with lightheadedness (no chest pain or SOB), and his HR was up to 196. Labs were unremarkable including renal function. He was started on IV Amiodarone , and he was then transitioned to oral Amiodarone  with good rate control. His ICD was recalibrated to fire with ventricular rates > 166. He is now also taking Digoxin , and he was changed from Warfarin to Eliquis . Since going home he has felt fine. They have no concerns today.    Review of Systems  Constitutional: Negative.   Respiratory: Negative.    Cardiovascular: Negative.   Neurological: Negative.        Objective:   Physical Exam Constitutional:      Appearance: Normal appearance.  Cardiovascular:     Rate and Rhythm: Normal rate. Rhythm irregular.     Pulses: Normal pulses.     Heart sounds: Normal heart sounds.  Pulmonary:     Effort: Pulmonary effort is normal.     Breath sounds: Normal breath sounds.  Neurological:     Mental Status: He is alert and oriented to person, place, and time. Mental status is at baseline.           Assessment & Plan:  He had a recent bout of V tach on top of persistent atrial fibrillation, and his rate is now well controlled with Amiodarone  and Digoxin . His BP is stable. He will follow up Dr. Waddell on 12-25-23.  Garnette Olmsted, MD

## 2023-12-12 ENCOUNTER — Other Ambulatory Visit: Payer: Self-pay

## 2023-12-12 NOTE — Patient Instructions (Signed)
 Visit Information  Thank you for taking time to visit with me today. Please don't hesitate to contact me if I can be of assistance to you before our next scheduled telephone appointment.  Our next appointment is by telephone on 12/19/23 at 2 pm  Following is a copy of your care plan:   Goals Addressed             This Visit's Progress    VBCI Transitions of Care (TOC) Care Plan   On track     Care Plan/Goals/Self Care Activities reviewed or updated with patient on 12/12/23.   Problems:  Recent Hospitalization for treatment of CHF and ventricular tachycardia, CAD, Permanent ICD(Abbot BiV ICD).  Knowledge Deficit Related to Chronic systolic heart failure, Ventricular Tachycardia, ICD device VT detection.   Goal:  Over the next 30 days, the patient will not experience hospital readmission  Interventions:  Transitions of Care: Discussed/allowed time if patient had any concerns or questions related to his discharge diagnosis or other conditions.  Patient stated he felt he had a good understanding of his conditions, self care, and what to contact provider for.  Reviewed all discharge instructions, PCP HFU, self care activities, medications, allergies, SDOH review, functional ADL's, assessment of systems, anxiety and depression screening.  Patient taking blood pressure, heart rate, and weights daily.  Heart rate per patient: 86 Blood pressure per patient: 103/72. Weight per patient:  168 lbs Annual Eye Exam Reviewed and patient has exams twice a year for history of glaucoma and cataracts, wear glasses. 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  Doctor Visits  - discussed the importance of doctor visits Patient has PCP HFU on 12/09/23: Completed, notes reviewed with patient. No medications changes noted, one clarified 12/12/23.   Cardiology EP with Dr. Waddell follow ups on 8/27 and  12/18/2023 for DEFIB checks post adjustments in hospitalization.  Pacemaker download per patient that he does at home patient stated.   CAD Interventions: Assessed understanding of CAD diagnosis Medications reviewed including medications utilized in CAD treatment plan Provided education on importance of blood pressure control in management of CAD Reviewed Importance of taking all medications as prescribed Reviewed Importance of attending all scheduled provider appointments Screening for signs and symptoms of depression related to chronic disease state Discussed/Reviewed reason for hospitalization, changes made to ICD:  ICD Heart rate now set to not be allowed to go below 85-86 per patient stated.   Heart rate today reported by patient to be 86.  Denies any dizziness, chest pain, shortness of breath or any other issues on review of systems assessment.  Patient stated he is feeling good, no problems, no reactions to new medications.  Heart Failure Interventions: Assessed need for readable accurate scales in home.  Patient has scales and reports weighing daily.  Provided education about placing scale on hard, flat surface Advised patient to weigh each morning after emptying bladder Discussed importance of daily weight and advised patient to weigh and record daily. Weight patient reported: 168 lbs Reviewed discharge heart failure instructions with patient, and what to call provider for,  especially noting the weight gain instructions/parameters to call provider for.  Discussed diet and that spouse cooks for patient taking these instructions in to account.  Discussed the importance of keeping all appointments with provider Screening for signs and symptoms of depression related to chronic disease state.  Patient denies any symptoms and stated he is doing good with no problems.   Assessed social determinant of health barriers  No change as  of 12/12/23.    Patient Self Care Activities: Reviewed with patient 12/12/23.  Attend all scheduled provider appointments Call pharmacy for medication refills 3-7 days in advance of running out of medications Call provider office for new concerns or questions  Notify RN Care Manager of TOC call rescheduling needs Participate in Transition of Care Program/Attend TOC scheduled calls Perform all self care activities independently  Take medications as prescribed   call office if I gain more than 2 pounds in one day or 5 pounds in one week track weight in diary use salt in moderation weigh myself daily know when to call the doctor:Reviewed discharge heart failure instructions and what to contact provider for, patient verbalized good understanding of self care management  Report any falls and/or bumps to the head to provider or seek emergency help due to being on blood thinners. Patient verbalized understanding and that he was well versed  in those precautions   Report any usual feelings of change in heart rate, dizziness, light headed feelings, weakness or not feeling well.   Plan:  An initial telephone outreach has been scheduled for: 12/19/2023 at 2 pm with Bing Edison RN CM  Follow up with cardiologist as scheduled.         Patient verbalizes understanding of instructions and care plan provided today and agrees to view in MyChart. Active MyChart status and patient understanding of how to access instructions and care plan via MyChart confirmed with patient.     Telephone follow up appointment with care management team member scheduled for: Our next appointment is by telephone on 12/19/23 at 2 pm  Please call the care guide team at [phone] if you need to cancel or reschedule your appointment.   Please call the Suicide and Crisis Lifeline: 988 call 1-800-273-TALK (toll free, 24 hour hotline) call 911 if you are experiencing a Mental Health or Behavioral Health Crisis or need someone to  talk to.   Bing Edison MSN, RN RN Case Sales executive Health  [facility]Population Health Office Hours M-F [phone] Direct Dial: [phone] Main Phone [phone]  Fax: [phone] [email or web address]

## 2023-12-12 NOTE — Transitions of Care (Post Inpatient/ED Visit) (Signed)
 Transition of Care week 2  Visit Note  12/12/2023  Name: Andrew Vance MRN: [id number]          DOB: 10-Apr-1936  Situation: Patient enrolled in [facility] 30-day program. Visit completed with patient by telephone.   Background:   Initial Transition Care Management Follow-up Telephone Call    Past Medical History:  Diagnosis Date   AICD (automatic cardioverter/defibrillator) present    Anemia, iron deficiency    hx   Arthritis    all over   Atrial fibrillation [facility])    sees Dr. 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 Elsie Chol    Tachycardia-bradycardia syndrome [facility])     Assessment: Patient Reported Symptoms: Cognitive Cognitive Status: No symptoms reported, Alert and oriented to person, place, and time, Insightful and able to interpret abstract concepts, Normal speech and language skills      Neurological Neurological Review of Symptoms: No symptoms reported Neurological Self-Management Outcome: 4 (good)  HEENT HEENT Symptoms Reported: No symptoms reported HEENT Management Strategies: Medical device, Routine screening    Cardiovascular Cardiovascular Symptoms Reported: No symptoms reported Does patient have uncontrolled Hypertension?: No Cardiovascular Management Strategies: Medical device, Medication therapy, Routine screening, Weight management, Diet modification, Coping strategies, Activity, Adequate rest Do You Have a Working Readable Scale?: Yes Weight: 168 lb (76.2 kg) Cardiovascular Self-Management Outcome: 4 (good) Cardiovascular Comment: Denies any lightheadedness or dizziness this week. BiV ICD with no issues reported.  Respiratory Respiratory  Symptoms Reported: No symptoms reported Respiratory Management Strategies: Routine screening, Diet modification Respiratory Self-Management Outcome: 4 (good)  Endocrine Endocrine Symptoms Reported: No symptoms reported Is patient diabetic?: No    Gastrointestinal Gastrointestinal Symptoms Reported: No symptoms reported      Genitourinary Genitourinary Symptoms Reported: No symptoms reported    Integumentary Other Integumentary Symptoms: Has psoraisis. Skin Management Strategies: Medication therapy, Routine screening, Coping strategies Skin Self-Management Outcome: 4 (good)  Musculoskeletal Musculoskelatal Symptoms Reviewed: Joint pain Musculoskeletal Management Strategies: Activity, Adequate rest, Coping strategies, Diet modification, Medication therapy, Routine screening Musculoskeletal Self-Management Outcome: 4 (good) Falls in the past year?:  (No falls reported this week.) Patient at Risk for Falls Due to: Impaired mobility, Orthopedic patient, Other (Comment) (cardiac ventricular pacemaker) Fall risk Follow up: Falls prevention discussed  Psychosocial Psychosocial Symptoms Reported: No symptoms reported         Vitals:   12/12/23 1434  BP: 103/72  Pulse: 86    Medications Reviewed Today     Reviewed by Carolee Heron NOVAK, RN (Case Manager) on 12/12/23 at 1410  Med List Status: <None>   Medication Order Taking? Sig Documenting Provider Last Dose Status Informant  acetaminophen  (TYLENOL ) 500 MG tablet [id number] Yes Take 1,000 mg by mouth every 6 (six) hours as needed for moderate pain (pain score 4-6). [provider]  Active Self, Pharmacy Records  amiodarone  (PACERONE ) 200 MG tablet [id number] Yes Take 2 tablets (400 mg total) by mouth daily. Lesia Ozell Barter, PA-C  Active   amoxicillin  (AMOXIL ) 500 MG capsule [id number]  Take 2,000 mg by mouth See admin instructions. Take 2000  mg 1 hour prior to dental work [provider]  Active Self, Pharmacy Records   apixaban  (ELIQUIS ) 5 MG TABS tablet [id number] Yes Take 1 tablet (5 mg total) by mouth 2 (two) times daily. Lesia Ozell Barter, PA-C  Active   carvedilol  (COREG ) 6.25 MG tablet [id number] Yes TAKE 1.5 TABLETS (9.375 MG TOTAL) BY MOUTH 2 (TWO) TIMES DAILY WITH A MEAL.  Patient taking differently: Take 9.375 mg by mouth 2 (two) times daily with a meal. Patient stated he is taking 1 tablet at 6.25 mg   Taylor, Gregg W, MD  Active Self, Pharmacy Records           Med Note JACKOLYN WADDELL DEL   Tue Dec 03, 2023  9:44 AM) Patient confirms 1BID, not 1.5BID.  digoxin  (LANOXIN ) 0.125 MG tablet [id number] Yes TAKE 1 TABLET BY MOUTH EVERY DAY  Patient taking differently: Take 125 mcg by mouth at bedtime.   WADDELL Danelle ORN, MD  Active Self, Pharmacy Records  fluocinonide (LIDEX) 0.05 % external solution [id number] Yes Apply 1 application  topically 2 (two) times daily.  Patient taking differently: Apply 1 application  topically 2 (two) times daily. Use as needed   [provider]  Active Self, Pharmacy Records  hydrocortisone 2.5 % lotion [id number] Yes Apply 1 Application topically 2 (two) times daily.  Patient taking differently: Apply 1 Application topically 2 (two) times daily. As needed per patient stated.   [provider]  Active Self, Pharmacy Records  Lutein 20 MG CAPS [id number] Yes Take 20 mg by mouth at bedtime.  [provider]  Active Self, Pharmacy Records  rosuvastatin  (CRESTOR ) 10 MG tablet [id number] Yes Take 1 tablet (10 mg total) by mouth daily.  Patient taking differently: Take 10 mg by mouth at bedtime.   [name] LABOR, MD  Active Self, Pharmacy Records           12/12/23 [facility] RN CM successfully completed scheduled outreach with patient.  The patient has been provided with contact information for the care management team and has been advised to call with any health-related questions or concerns.  The patient verbalized understanding with current POC.  The  patient is directed to their insurance card regarding availability of benefits coverage    Recommendation:   Continue Current Plan of Care  Follow Up Plan:   Telephone follow-up in 1 week   Bing Edison MSN, RN RN Case Manager [facility]  VBCI-Population Health Office Hours M-F [phone] Direct Dial: [phone] Main Phone [phone]  Fax: [phone] [email or web address]

## 2023-12-16 ENCOUNTER — Other Ambulatory Visit: Payer: Self-pay

## 2023-12-17 MED ORDER — CARVEDILOL 6.25 MG PO TABS: 9.3750 mg | ORAL_TABLET | Freq: Two times a day (BID) | ORAL | 1 refills | Status: AC

## 2023-12-18 ENCOUNTER — Ambulatory Visit (INDEPENDENT_AMBULATORY_CARE_PROVIDER_SITE_OTHER)

## 2023-12-18 DIAGNOSIS — I4891 Unspecified atrial fibrillation: Secondary | ICD-10-CM | POA: Diagnosis not present

## 2023-12-19 ENCOUNTER — Other Ambulatory Visit: Payer: Self-pay

## 2023-12-19 LAB — CUP PACEART REMOTE DEVICE CHECK
Battery Remaining Longevity: 85 mo
Battery Remaining Percentage: 91 %
Battery Voltage: 3.01 V
Date Time Interrogation Session: 20250820085210
HighPow Impedance: 56 Ohm
Implantable Lead Connection Status: 753985
Implantable Lead Connection Status: 753985
Implantable Lead Connection Status: 753985
Implantable Lead Implant Date: 20100521
Implantable Lead Implant Date: 20160721
Implantable Lead Implant Date: 20160721
Implantable Lead Location: 753858
Implantable Lead Location: 753859
Implantable Lead Location: 753860
Implantable Lead Model: 7122
Implantable Pulse Generator Implant Date: 20250520
Lead Channel Impedance Value: 460 Ohm
Lead Channel Impedance Value: 650 Ohm
Lead Channel Pacing Threshold Amplitude: 0.625 V
Lead Channel Pacing Threshold Amplitude: 1.25 V
Lead Channel Pacing Threshold Pulse Width: 0.5 ms
Lead Channel Pacing Threshold Pulse Width: 0.5 ms
Lead Channel Sensing Intrinsic Amplitude: 7 mV
Lead Channel Setting Pacing Amplitude: 1.625
Lead Channel Setting Pacing Amplitude: 2.5 V
Lead Channel Setting Pacing Pulse Width: 0.5 ms
Lead Channel Setting Pacing Pulse Width: 0.5 ms
Lead Channel Setting Sensing Sensitivity: 0.3 mV
Pulse Gen Serial Number: 111072965

## 2023-12-19 NOTE — Transitions of Care (Post Inpatient/ED Visit) (Signed)
 Transition of Care week 3  Visit Note  12/19/2023  Name: Andrew Vance MRN: [id number]          DOB: July 06, 1935  Situation: Patient enrolled in [facility] 30-day program. Visit completed with patient by telephone.   Background:   Initial Transition Care Management Follow-up Telephone Call    Past Medical History:  Diagnosis Date   AICD (automatic cardioverter/defibrillator) present    Anemia, iron deficiency    hx   Arthritis    all over   Atrial fibrillation [facility])    sees Dr. 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 Elsie Chol    Tachycardia-bradycardia syndrome [facility])     Assessment: Patient Reported Symptoms: Cognitive Cognitive Status: No symptoms reported, Alert and oriented to person, place, and time, Insightful and able to interpret abstract concepts, Normal speech and language skills      Neurological Neurological Review of Symptoms: Dizziness (Very Occasional 1-2 sec bouts of feeling a bit lightheaded, but stated it was not concerning to him but will report to cardiologist provider next week and call if it worsens.) Neurological Self-Management Outcome: 4 (good)  HEENT HEENT Symptoms Reported: No symptoms reported      Cardiovascular Cardiovascular Symptoms Reported: No symptoms reported Does patient have uncontrolled Hypertension?: No Cardiovascular Management Strategies: Medical device, Medication therapy, Routine screening, Weight management, Coping strategies, Activity, Exercise, Adequate rest Do You Have a Working Readable Scale?: Yes Weight: 168 lb (76.2 kg) Cardiovascular Self-Management Outcome: 5 (very good) Cardiovascular Comment: See note in  neurological assessment for brief 1-2 seconds of occasional lighheaded feeling.  Respiratory Respiratory Symptoms Reported: No symptoms reported Additional Respiratory Details: Denies shortness of breath, chest pain, LE edema or swelling. Respiratory Management Strategies: Routine screening, Activity, Adequate rest Respiratory Self-Management Outcome: 4 (good)  Endocrine Endocrine Symptoms Reported: No symptoms reported    Gastrointestinal Gastrointestinal Symptoms Reported: No symptoms reported      Genitourinary Genitourinary Symptoms Reported: No symptoms reported    Integumentary Integumentary Symptoms Reported:  (No change from prior assessment, had dermatology follow up in November 2025.) Skin Management Strategies: Medication therapy, Routine screening, Coping strategies Skin Self-Management Outcome: 4 (good)  Musculoskeletal Musculoskelatal Symptoms Reviewed: Joint pain Other Musculoskeletal Symptoms: Patient stated he has psoriatic arthritis since mied 20's, had bilateral hip replacement years ago with good results, but has issues in hands and knuckes. Musculoskeletal Management Strategies: Activity, Exercise, Coping strategies, Medication therapy, Routine screening, Diet modification Musculoskeletal Self-Management Outcome: 4 (good) Falls in the past year?: No Patient at Risk for Falls Due to: Orthopedic patient, Other (Comment) (some lighheaded feeling occasionally, no falls reported this week.) Fall risk Follow up: Falls prevention discussed  Psychosocial Psychosocial Symptoms Reported: No symptoms reported         Vitals:   12/19/23 1443  BP: (!) 137/93  Pulse: 86    Medications Reviewed Today     Reviewed by Carolee Heron NOVAK, RN (Case Manager) on 12/19/23 at 1417  Med List Status: <None>   Medication Order Taking? Sig Documenting Provider Last Dose Status Informant  acetaminophen  (TYLENOL )  500 MG tablet [id number] Yes Take 1,000 mg by mouth every 6 (six) hours as  needed for moderate pain (pain score 4-6). [provider]  Active Self, Pharmacy Records  amiodarone  (PACERONE ) 200 MG tablet [id number] Yes Take 2 tablets (400 mg total) by mouth daily. Lesia Ozell Barter, PA-C  Active   amoxicillin  (AMOXIL ) 500 MG capsule [id number] Yes Take 2,000 mg by mouth See admin instructions. Take 2000 mg 1 hour prior to dental work [provider]  Active Self, Pharmacy Records           Med Note SYDELL, Flowing Boeve B   Thu Dec 19, 2023  2:17 PM) As prescribed for any dental work only.  apixaban  (ELIQUIS ) 5 MG TABS tablet [id number] Yes Take 1 tablet (5 mg total) by mouth 2 (two) times daily. Lesia Ozell Barter, PA-C  Active   carvedilol  (COREG ) 6.25 MG tablet [id number] Yes Take 1.5 tablets (9.375 mg total) by mouth 2 (two) times daily with a meal. Waddell Danelle ORN, MD  Active   digoxin  (LANOXIN ) 0.125 MG tablet [id number] Yes TAKE 1 TABLET BY MOUTH EVERY DAY  Patient taking differently: Take 125 mcg by mouth at bedtime.   Waddell Danelle ORN, MD  Active Self, Pharmacy Records  fluocinonide (LIDEX) 0.05 % external solution [id number] Yes Apply 1 application  topically 2 (two) times daily.  Patient taking differently: Apply 1 application  topically 2 (two) times daily. Use as needed   [provider]  Active Self, Pharmacy Records  hydrocortisone 2.5 % lotion [id number] Yes Apply 1 Application topically 2 (two) times daily.  Patient taking differently: Apply 1 Application topically 2 (two) times daily. As needed per patient stated.   [provider]  Active Self, Pharmacy Records  Lutein 20 MG CAPS [id number]  Take 20 mg by mouth at bedtime.  [provider]  Active Self, Pharmacy Records  rosuvastatin  (CRESTOR ) 10 MG tablet [id number] Yes Take 1 tablet (10 mg total) by mouth daily.  Patient taking differently: Take 10 mg by mouth at bedtime.   [name] LABOR, MD  Active Self, Pharmacy Records            Goals Addressed              This Visit's Progress    VBCI Transitions of Care [facility]) Care Plan        Care Plan/Goals/Self Care Activities reviewed or updated with patient on 12/19/23.   Problems:  Recent Hospitalization for treatment of CHF and ventricular tachycardia, CAD, Permanent ICD(Abbot BiV ICD).  Knowledge Deficit Related to Chronic systolic heart failure, Ventricular Tachycardia, ICD device VT detection.   Goal:  Over the next 30 days, the patient will not experience hospital readmission  Interventions:  Transitions of Care: Discussed/allowed time if patient had any concerns or questions related to his discharge diagnosis or other conditions.  Patient stated he felt really good, has an occasionally 1-2 sec feeling of lightheadedness, but nothing concerning, and that he had a good understanding of his conditions, self care, and what to contact provider for.  Reviewed all discharge instructions, PCP HFU, self care activities, medications, allergies, SDOH review, functional ADL's, assessment of systems, anxiety and depression screening.  Patient taking and logging blood pressure, heart rate, and weights daily.  Heart rate per patient: 86 (set by ICD ventricular rate at 85).  Blood pressure per patient: 137/93 today.  Weight per patient:  168 lbs No lower extremity edema/swelling.  No falls per patient.  No  changes in medications, allergies, functional status, SDOH, depression score Annual Eye Exam Reviewed and patient has exams twice a year for history of glaucoma and cataracts, wear glasses. Health Screening reviewed with patient/caregiver: Patient has regular eye exams as mentioned above. 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  November 2025 next follow up per patient.  Doctor Visits  - discussed the importance of doctor visits. Patient completed PCP HFU on 12/09/23: Completed, notes reviewed with patient. No medications changes noted,  one clarified 12/12/23.  No changes on 12/19/23.  Cardiology EP with Dr. Waddell follow ups on 8/27. 12/18/2023 for DEFIB check completed post adjustments in hospitalization.  Pacemaker download per patient that he does at home patient stated.  Completed.  CAD Interventions: Assessed understanding of CAD diagnosis. Medications reviewed including medications utilized in CAD treatment plan. Provided education on importance of blood pressure control in management of CAD. Reviewed Importance of taking all medications as prescribed. Reviewed Importance of attending all scheduled provider appointments. Screening for signs and symptoms of depression related to chronic disease state. Denies shortness of breath, chest pain, any edema in lower extremities, weight stable at 168 lbs. Discussed/Reviewed reason for hospitalization, changes made to ICD:  ICD Heart rate now set to not be allowed to go below 85-86 per patient stated.   Heart rate today reported by patient to be 86.  Denies any significant dizziness, chest pain, shortness of breath or any other issues on review of systems assessment.  Patient stated he has occasional 1-2 seconds of feeling briefly lightheaded, but nothing that is concerning.  Encourage to report to provider and call if worsens.  Patient stated he is feeling good, no problems, no reactions to new medications.   Heart Failure Interventions: Assessed need for readable accurate scales in home.  Patient has scales and reports weighing daily.  Provided education about placing scale on hard, flat surface Advised patient to weigh each morning after emptying bladder Discussed importance of daily weight and advised patient to weigh and record daily. Weight patient reported: 168 lbs today as well as last week.  Reviewed discharge heart failure instructions with patient, and what to call provider for,  especially noting the weight gain instructions/parameters to call provider for.   Discussed diet and that spouse cooks for patient taking these instructions in to account.  Discussed the importance of keeping all appointments with provider Screening for signs and symptoms of depression related to chronic disease state. Patient denies any symptoms and stated he is doing good with no problems.   Noted above brief seconds of feeling lightheaded occasionally.  Denies shortness of breath, chest pain, any edema in lower extremities, weight stable at 168 lbs. Assessed social determinant of health barriers  No change as of 12/19/23.   Patient Self Care Activities: Reviewed with patient 12/19/23.  Attend all scheduled provider appointments Call pharmacy for medication refills 3-7 days in advance of running out of medications Call provider office for new concerns or questions  Notify RN Care Manager of TOC call rescheduling needs Participate in Transition of Care Program/Attend TOC scheduled calls Perform all self care activities independently  Take medications as prescribed   call office if I gain more than 2 pounds in one day or 5 pounds in one week track weight in diary use salt in moderation weigh myself daily know when to call the doctor:Reviewed discharge heart failure instructions and what to contact provider for, patient verbalized good understanding of  self care management  Report any falls and/or bumps to the head to provider or seek emergency help due to being on blood thinners. Patient verbalized understanding and that he was well versed  in those precautions   Report any usual feelings of change in heart rate, dizziness, light headed feelings, weakness or not feeling well.   Plan:  An initial telephone outreach has been scheduled for: 12/26/2023 at 2 pm with Bing Edison RN CM  Follow up with cardiologist as scheduled on 12/25/23       Goals are on track this visit.   Recommendation:   Continue Current Plan of Care  Follow Up Plan:   Telephone follow-up in 1  week on 12/26/23 at 2 pm.    Bing Edison MSN, RN RN Case Manager [facility]  VBCI-Population Health Office Hours M-F [phone] Direct Dial: [phone] Main Phone [phone]  Fax: [phone] [email or web address]

## 2023-12-19 NOTE — Patient Instructions (Signed)
 Visit Information  Thank you for taking time to visit with me today. Please don't hesitate to contact me if I can be of assistance to you before our next scheduled telephone appointment.  Our next appointment is by telephone on 12/26/23 at 2 pm  Following is a copy of your care plan:   Goals Addressed             This Visit's Progress    VBCI Transitions of Care [facility]) Care Plan        Care Plan/Goals/Self Care Activities reviewed or updated with patient on 12/19/23.   Problems:  Recent Hospitalization for treatment of CHF and ventricular tachycardia, CAD, Permanent ICD(Abbot BiV ICD).  Knowledge Deficit Related to Chronic systolic heart failure, Ventricular Tachycardia, ICD device VT detection.   Goal:  Over the next 30 days, the patient will not experience hospital readmission  Interventions:  Transitions of Care: Discussed/allowed time if patient had any concerns or questions related to his discharge diagnosis or other conditions.  Patient stated he felt really good, has an occasionally 1-2 sec feeling of lightheadedness, but nothing concerning, and that he had a good understanding of his conditions, self care, and what to contact provider for.  Reviewed all discharge instructions, PCP HFU, self care activities, medications, allergies, SDOH review, functional ADL's, assessment of systems, anxiety and depression screening.  Patient taking and logging blood pressure, heart rate, and weights daily.  Heart rate per patient: 86 (set by ICD ventricular rate at 85).  Blood pressure per patient: 137/93 today.  Weight per patient:  168 lbs No lower extremity edema/swelling.  No falls per patient.  No changes in medications, allergies, functional status, SDOH, depression score Annual Eye Exam Reviewed and patient has exams twice a year for history of glaucoma and cataracts, wear glasses. Health Screening reviewed with patient/caregiver: Patient has regular eye exams as mentioned  above. 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  November 2025 next follow up per patient.  Doctor Visits  - discussed the importance of doctor visits. Patient completed PCP HFU on 12/09/23: Completed, notes reviewed with patient. No medications changes noted, one clarified 12/12/23.  No changes on 12/19/23.  Cardiology EP with Dr. Waddell follow ups on 8/27. 12/18/2023 for DEFIB check completed post adjustments in hospitalization.  Pacemaker download per patient that he does at home patient stated.  Completed.  CAD Interventions: Assessed understanding of CAD diagnosis. Medications reviewed including medications utilized in CAD treatment plan. Provided education on importance of blood pressure control in management of CAD. Reviewed Importance of taking all medications as prescribed. Reviewed Importance of attending all scheduled provider appointments. Screening for signs and symptoms of depression related to chronic disease state. Denies shortness of breath, chest pain, any edema in lower extremities, weight stable at 168 lbs. Discussed/Reviewed reason for hospitalization, changes made to ICD:  ICD Heart rate now set to not be allowed to go below 85-86 per patient stated.   Heart rate today reported by patient to be 86.  Denies any significant dizziness, chest pain, shortness of breath or any other issues on review of systems assessment.  Patient stated he has occasional 1-2 seconds of feeling briefly lightheaded, but nothing that is concerning.  Encourage to report to provider and call if worsens.  Patient stated he is feeling good, no problems, no reactions to new medications.   Heart Failure Interventions: Assessed need for readable accurate scales in home.  Patient has scales  and reports weighing daily.  Provided education about placing scale on hard, flat surface Advised patient to weigh each morning after emptying  bladder Discussed importance of daily weight and advised patient to weigh and record daily. Weight patient reported: 168 lbs today as well as last week.  Reviewed discharge heart failure instructions with patient, and what to call provider for,  especially noting the weight gain instructions/parameters to call provider for.  Discussed diet and that spouse cooks for patient taking these instructions in to account.  Discussed the importance of keeping all appointments with provider Screening for signs and symptoms of depression related to chronic disease state. Patient denies any symptoms and stated he is doing good with no problems.   Noted above brief seconds of feeling lightheaded occasionally.  Denies shortness of breath, chest pain, any edema in lower extremities, weight stable at 168 lbs. Assessed social determinant of health barriers  No change as of 12/19/23.   Patient Self Care Activities: Reviewed with patient 12/19/23.  Attend all scheduled provider appointments Call pharmacy for medication refills 3-7 days in advance of running out of medications Call provider office for new concerns or questions  Notify RN Care Manager of TOC call rescheduling needs Participate in Transition of Care Program/Attend TOC scheduled calls Perform all self care activities independently  Take medications as prescribed   call office if I gain more than 2 pounds in one day or 5 pounds in one week track weight in diary use salt in moderation weigh myself daily know when to call the doctor:Reviewed discharge heart failure instructions and what to contact provider for, patient verbalized good understanding of self care management  Report any falls and/or bumps to the head to provider or seek emergency help due to being on blood thinners. Patient verbalized understanding and that he was well versed  in those precautions   Report any usual feelings of change in heart rate, dizziness, light headed feelings,  weakness or not feeling well.   Plan:  An initial telephone outreach has been scheduled for: 12/26/2023 at 2 pm with Bing Edison RN CM  Follow up with cardiologist as scheduled on 12/25/23        Patient verbalizes understanding of instructions and care plan provided today and agrees to view in MyChart. Active MyChart status and patient understanding of how to access instructions and care plan via MyChart confirmed with patient.     Telephone follow up appointment with care management team member scheduled qnm:Uzozeynwz follow-up in 1 week on 12/26/23 at 2 pm.   Please call the care guide team at [phone] if you need to cancel or reschedule your appointment.   Please call the Suicide and Crisis Lifeline: 988 call 1-800-273-TALK (toll free, 24 hour hotline) call 911 if you are experiencing a Mental Health or Behavioral Health Crisis or need someone to talk to.   Bing Edison MSN, RN RN Case Sales executive Health  VBCI-Population Health Office Hours M-F [phone] Direct Dial: [phone] Main Phone [phone]  Fax: [phone] [email or web address]

## 2023-12-22 ENCOUNTER — Ambulatory Visit: Payer: Self-pay | Admitting: Internal Medicine

## 2023-12-25 ENCOUNTER — Ambulatory Visit: Attending: Cardiovascular Disease | Admitting: Internal Medicine

## 2023-12-25 ENCOUNTER — Encounter: Payer: Self-pay | Admitting: Internal Medicine

## 2023-12-25 VITALS — BP 118/68 | HR 87 | Ht 68.0 in | Wt 173.6 lb

## 2023-12-25 DIAGNOSIS — I4891 Unspecified atrial fibrillation: Secondary | ICD-10-CM

## 2023-12-25 DIAGNOSIS — I5022 Chronic systolic (congestive) heart failure: Secondary | ICD-10-CM

## 2023-12-25 LAB — CUP PACEART INCLINIC DEVICE CHECK
Battery Remaining Longevity: 85 mo
Brady Statistic RA Percent Paced: 0 %
Brady Statistic RV Percent Paced: 90 %
Date Time Interrogation Session: 20250827144928
HighPow Impedance: 63 Ohm
Implantable Lead Connection Status: 753985
Implantable Lead Connection Status: 753985
Implantable Lead Connection Status: 753985
Implantable Lead Implant Date: 20100521
Implantable Lead Implant Date: 20160721
Implantable Lead Implant Date: 20160721
Implantable Lead Location: 753858
Implantable Lead Location: 753859
Implantable Lead Location: 753860
Implantable Lead Model: 7122
Implantable Pulse Generator Implant Date: 20250520
Lead Channel Impedance Value: 375 Ohm
Lead Channel Impedance Value: 487.5 Ohm
Lead Channel Impedance Value: 625 Ohm
Lead Channel Pacing Threshold Amplitude: 1 V
Lead Channel Pacing Threshold Amplitude: 1 V
Lead Channel Pacing Threshold Amplitude: 1.5 V
Lead Channel Pacing Threshold Amplitude: 1.5 V
Lead Channel Pacing Threshold Pulse Width: 0.5 ms
Lead Channel Pacing Threshold Pulse Width: 0.5 ms
Lead Channel Pacing Threshold Pulse Width: 0.5 ms
Lead Channel Pacing Threshold Pulse Width: 0.5 ms
Lead Channel Sensing Intrinsic Amplitude: 9.6 mV
Lead Channel Setting Pacing Amplitude: 1.625
Lead Channel Setting Pacing Amplitude: 2.5 V
Lead Channel Setting Pacing Pulse Width: 0.5 ms
Lead Channel Setting Pacing Pulse Width: 0.5 ms
Lead Channel Setting Sensing Sensitivity: 0.3 mV
Pulse Gen Serial Number: 111072965

## 2023-12-25 MED ORDER — AMIODARONE HCL 200 MG PO TABS
200.0000 mg | ORAL_TABLET | Freq: Every day | ORAL | 6 refills | Status: DC
Start: 2023-12-25 — End: 2023-12-31

## 2023-12-25 NOTE — Progress Notes (Signed)
 HPI Andrew Vance returns today for followup. He is a pleasant 88 yo man with a h/o atrial fib, symptomatic bradycardia and LBBB. He is s/p biV ICD. He has class 2 CHF symptoms. His EF was 15-20% initially then 25-30% by repeat echo. He has been on a beta blocker and ACE inhibitor and with initiation of lasix  his dyspnea has resolved and his peripheral edema improved.  He denies chest pain.  He remains active working in his yard. He had VT below the device detection but did not lose consciousness. He was started on amiodarone . He feels well. He wonders if he can have a drink a day. Allergies  Allergen Reactions   Zithromax  [Azithromycin ] Other (See Comments)    Affects Coumadin  levels      Current Outpatient Medications  Medication Sig Dispense Refill   acetaminophen  (TYLENOL ) 500 MG tablet Take 1,000 mg by mouth every 6 (six) hours as needed for moderate pain (pain score 4-6).     amiodarone  (PACERONE ) 200 MG tablet Take 2 tablets (400 mg total) by mouth daily. 60 tablet 6   amoxicillin  (AMOXIL ) 500 MG capsule Take 2,000 mg by mouth See admin instructions. Take 2000 mg 1 hour prior to dental work     apixaban  (ELIQUIS ) 5 MG TABS tablet Take 1 tablet (5 mg total) by mouth 2 (two) times daily. 60 tablet 6   carvedilol  (COREG ) 6.25 MG tablet Take 1.5 tablets (9.375 mg total) by mouth 2 (two) times daily with a meal. 270 tablet 1   digoxin  (LANOXIN ) 0.125 MG tablet TAKE 1 TABLET BY MOUTH EVERY DAY (Patient taking differently: Take 125 mcg by mouth at bedtime.) 90 tablet 3   fluocinonide (LIDEX) 0.05 % external solution Apply 1 application  topically 2 (two) times daily. (Patient taking differently: Apply 1 application  topically 2 (two) times daily. Use as needed)     hydrocortisone 2.5 % lotion Apply 1 Application topically 2 (two) times daily. (Patient taking differently: Apply 1 Application topically 2 (two) times daily. As needed per patient stated.)     Lutein 20 MG CAPS Take 20 mg by  mouth at bedtime.      rosuvastatin  (CRESTOR ) 10 MG tablet Take 1 tablet (10 mg total) by mouth daily. (Patient taking differently: Take 10 mg by mouth at bedtime.) 90 tablet 3   No current facility-administered medications for this visit.     Past Medical History:  Diagnosis Date   AICD (automatic cardioverter/defibrillator) present    Anemia, iron deficiency    hx   Arthritis    all over   Atrial fibrillation [facility])    sees Dr. 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 Elsie Chol    Tachycardia-bradycardia syndrome [facility])     ROS:   All systems reviewed and negative except as noted in the HPI.   Past Surgical History:  Procedure Laterality Date   APPENDECTOMY     BIV ICD GENERATOR CHANGEOUT N/A 09/17/2023   Procedure: BIV ICD GENERATOR CHANGEOUT;  Surgeon: Waddell Danelle ORN, MD;  Location: [facility] INVASIVE CV LAB;  Service: Cardiovascular;  Laterality: N/A;   CARDIAC CATHETERIZATION  09/10/08   CARDIAC DEFIBRILLATOR PLACEMENT  11/18/2014   CATARACT EXTRACTION, BILATERAL  2020   at [facility]    COLONOSCOPY  05-27-10   per Dr, Teressa, extensive diverticulosis, repeat in 10 yrs    EP IMPLANTABLE DEVICE N/A 11/18/2014   Procedure: BiV ICD Upgrade;  Surgeon: Danelle LELON Birmingham, MD;  Location: [facility] INVASIVE CV LAB;  Service: Cardiovascular;  Laterality: N/A;   ESOPHAGOGASTRODUODENOSCOPY  05-27-10   per Dr. Teressa, normal    GLAUCOMA SURGERY Bilateral 2020   at [facility]    HAND SURGERY Left 1978   lft hand, fusions to DIPs and PIPs of fingers 1,2,4,and 5   INSERT / REPLACE / REMOVE PACEMAKER  2010   JOINT REPLACEMENT     PILONIDAL CYST EXCISION  1961   TOTAL HIP ARTHROPLASTY Bilateral  1998 `2001   bilateral, sees Dr. [name]    TOTAL HIP REVISION  02/25/2012   Procedure: TOTAL HIP REVISION;  Surgeon: Dempsey JINNY Sensor, MD;  Location: [facility] OR;  Service: Orthopedics;  Laterality: Left;     Family History  Problem Relation Age of Onset   Hypertension Other    Heart disease Other      Social History   Socioeconomic History   Marital status: Married    Spouse name: Not on file   Number of children: Not on file   Years of education: Not on file   Highest education level: 12th grade  Occupational History   Not on file  Tobacco Use   Smoking status: Former    Current packs/day: 0.00    Average packs/day: 1 pack/day for 30.0 years (30.0 ttl pk-yrs)    Types: Cigarettes, Pipe    Start date: 02/18/1952    Quit date: 02/17/1982    Years since quitting: 41.8   Smokeless tobacco: Former    Quit date: 05/01/1983  Vaping Use   Vaping status: Never Used  Substance and Sexual Activity   Alcohol  use: Yes    Alcohol /week: 3.0 standard drinks of alcohol     Types: 2 Shots of liquor, 1 Standard drinks or equivalent per week   Drug use: No   Sexual activity: Yes  Other Topics Concern   Not on file  Social History Narrative   Not on file   Social Drivers of Health   Financial Resource Strain: Low Risk  (12/05/2023)   Overall Financial Resource Strain (CARDIA)    Difficulty of Paying Living Expenses: Not hard at all  Food Insecurity: No Food Insecurity (12/19/2023)   Hunger Vital Sign    Worried About Running Out of Food in the Last Year: Never true    Ran Out of Food in the Last Year: Never true  Transportation Needs: No Transportation Needs (12/19/2023)   PRAPARE - Administrator, Civil Service (Medical): No    Lack of Transportation (Non-Medical): No  Physical Activity: Insufficiently Active (12/05/2023)   Exercise Vital Sign    Days of Exercise per Week: 1 day    Minutes of Exercise per Session: 30 min  Stress: No Stress Concern Present (12/05/2023)   [employer] of Occupational Health - Occupational Stress Questionnaire    Feeling of Stress: Not at all  Social Connections: Moderately Isolated (12/19/2023)   Social Connection and Isolation Panel    Frequency of Communication with Friends and Family: Twice a week    Frequency of Social Gatherings with  Friends and Family: Once a week    Attends Religious Services: Never    Database administrator or Organizations: No    Attends Banker Meetings: Never    Marital Status: Married  Catering manager Violence: Not At Risk (12/19/2023)   Humiliation, Afraid, Rape, and Kick questionnaire    Fear of Current or Ex-Partner: No    Emotionally Abused: No    Physically Abused: No    Sexually Abused: No     BP 118/68   Pulse 87   Ht 5' 8 (1.727 m)   Wt 173 lb 9.6 oz (78.7 kg)   SpO2 97%   BMI 26.40 kg/m   Physical Exam:  Well appearing NAD HEENT: Unremarkable Neck:  No JVD, no thyromegally Lymphatics:  No adenopathy Back:  No CVA tenderness Lungs:  Clear HEART:  Regular rate rhythm, no murmurs, no rubs, no clicks Abd:  soft, positive bowel sounds, no organomegally, no rebound, no guarding Ext:  2 plus pulses, no edema, no cyanosis, no clubbing Skin:  No rashes no nodules Neuro:  CN II through XII intact, motor grossly intact  EKG - afib with biv pacing  DEVICE  Normal device function.  See PaceArt for details.   Assess/Plan:  1. Atrial fib - his VR is well controlled. 2. ICD - his St. Jude Biv ICD is working normally.  3. Chronic systolic heart failure - he has class 2 symptoms. He will continue guideline directed medical therapy. 4. Dyslipidemia - he will continue his statin therapy. 5. VT - he will continue amio, decreasing the dose to 200 mg daily.   Danelle Hollins Grosser,MD

## 2023-12-25 NOTE — Patient Instructions (Addendum)
 Medication Instructions:  Your physician has recommended you make the following change in your medication:  Decrease amiodarone  to 200mg  daily  Lab Work: None ordered.  You may go to any [facility] Location for your lab work:  [facility] - [address] ([facility]) - [phone] [address]  [place] - [address]   [place]  - [address]   [place] - [address] - [address] [employer]  - [address] ([facility]   If you have labs (blood work) drawn today and your tests are completely normal, you will receive your results only by: [employer] (if you have MyChart)  If you have any lab test that is abnormal or we need to change your treatment, we will call you or send a MyChart message to review the results.  Testing/Procedures: None ordered.  Follow-Up: At [facility], you and your health needs are our priority.  As part of our continuing mission to provide you with exceptional heart care, we have created designated Provider Care Teams.  These Care Teams include your primary Cardiologist (physician) and Advanced Practice Providers (APPs -  Physician Assistants and Nurse Practitioners) who all work together to provide you with the care you need, when you need it.  We recommend signing up for the patient portal called MyChart.  Sign up information is provided on this After Visit Summary.  MyChart is used to connect with patients for Virtual Visits (Telemedicine).  Patients are able to view lab/test results, encounter notes, upcoming appointments, etc.  Non-urgent messages can be sent to your provider as well.   To learn more about what you can do with MyChart, go to [email or web address].    Your next appointment:   1 year(s)  The format for your next appointment:   In Person  Provider:   Donnice Primus, MD  or one of the following Advanced Practice Providers on your designated Care Team:   Charlies Arthur, [place] Ozell Jodie Passey, [place] Leotis Barrack, NP  Note: Remote monitoring is used to monitor your Pacemaker/ ICD from home. This monitoring reduces the number of office visits required to check your device to one time per year. It allows us  to keep an eye on the functioning of your device to ensure it is working properly.

## 2023-12-26 ENCOUNTER — Telehealth: Payer: Self-pay

## 2023-12-26 ENCOUNTER — Other Ambulatory Visit: Payer: Self-pay

## 2023-12-26 NOTE — Patient Instructions (Signed)
 Visit Information  Thank you for taking time to visit with me today. Please don't hesitate to contact me if I can be of assistance to you before our next scheduled telephone appointment.  Our next appointment is by telephone on 01/02/24 at 3 pm (approximately)  Following is a copy of your care plan:   Goals Addressed             This Visit's Progress    VBCI Transitions of Care (TOC) Care Plan   On track     Care Plan/Goals/Self Care Activities reviewed or updated with patient on 12/26/23.   Problems:  Recent Hospitalization for treatment of CHF and ventricular tachycardia, CAD, Permanent ICD(Abbot BiV ICD).  Knowledge Deficit Related to Chronic systolic heart failure, Ventricular Tachycardia, ICD device VT detection.   Goal:  Over the next 30 days, the patient will not experience hospital readmission  Interventions:  Transitions of Care: Discussed/allowed time if patient had any concerns or questions related to his discharge diagnosis or other conditions.  Patient stated he felt really good, has an occasionally 1-2 sec feeling of lightheadedness, but nothing concerning, and that he had a good understanding of his conditions, self care, and what to contact provider for.  Reviewed all discharge instructions, PCP HFU, self care activities, medications, allergies, SDOH review, functional ADL's, assessment of systems, anxiety and depression screening.  Patient taking and logging blood pressure, heart rate, and weights daily.  Heart rate per patient: 86 (set by ICD ventricular rate at 85).  No lower extremity edema/swelling.  No falls per patient.  No changes in medications, allergies, functional status, SDOH, depression score Annual Eye Exam Reviewed and patient has exams twice a year for history of glaucoma and cataracts, wear glasses. Health Screening reviewed with patient/caregiver: Patient has regular eye exams as mentioned above. 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  November 2025 next follow up per patient.  Doctor Visits  - discussed the importance of doctor visits. Patient completed PCP HFU on 12/09/23: Completed, notes reviewed with patient. No medications changes noted, one clarified 12/12/23.  No changes on 12/19/23.  Cardiology EP with Dr. Waddell follow ups on 8/27.  Completed, released for 1 yr., one medication change noted.  12/18/2023 for DEFIB check completed post adjustments in hospitalization.  Pacemaker download per patient that he does at home patient stated.  Completed.  CAD Interventions: Assessed understanding of CAD diagnosis. Medications reviewed including medications utilized in CAD treatment plan. Provided education on importance of blood pressure control in management of CAD. Reviewed Importance of taking all medications as prescribed. Reviewed Importance of attending all scheduled provider appointments. Screening for signs and symptoms of depression related to chronic disease state. Denies shortness of breath, chest pain, any edema in lower extremities, weight stable at 168 lbs. Discussed/Reviewed reason for hospitalization, changes made to ICD:  ICD Heart rate now set to not be allowed to go below 85-86 per patient stated.   Heart rate today reported by patient to be 86.  Denies any significant dizziness, chest pain, shortness of breath or any other issues on review of systems assessment.  Denies previous dizziness or lightheadedness this week.  Cleared to drive and drove today without any issues.  Encourage to report to provider and call if worsens.  Patient stated he is feeling good, no problems, no reactions to new medications.   Heart Failure Interventions: Assessed need for readable accurate scales in home.  Patient has  scales and reports weighing daily.  Provided education about placing scale on hard, flat surface Advised patient to weigh each morning after emptying  bladder Discussed importance of daily weight and advised patient to weigh and record daily. Weight patient reported: 168 lbs today as well as last week.  Reviewed discharge heart failure instructions with patient, and what to call provider for,  especially noting the weight gain instructions/parameters to call provider for.  Discussed diet and that spouse cooks for patient taking these instructions in to account.  Discussed the importance of keeping all appointments with provider Screening for signs and symptoms of depression related to chronic disease state. Patient denies any symptoms and stated he is doing good with no problems.   Denies shortness of breath, chest pain, any edema in lower extremities, weight stable at 168 lbs. Assessed social determinant of health barriers  No change as of 12/26/23.   Patient Self Care Activities: Reviewed with patient 12/26/23.  Attend all scheduled provider appointments Call pharmacy for medication refills 3-7 days in advance of running out of medications Call provider office for new concerns or questions  Notify RN Care Manager of TOC call rescheduling needs Participate in Transition of Care Program/Attend TOC scheduled calls Perform all self care activities independently  Take medications as prescribed   call office if I gain more than 2 pounds in one day or 5 pounds in one week track weight in diary use salt in moderation weigh myself daily know when to call the doctor:Reviewed discharge heart failure instructions and what to contact provider for, patient verbalized good understanding of self care management  Report any falls and/or bumps to the head to provider or seek emergency help due to being on blood thinners. Patient verbalized understanding and that he was well versed  in those precautions   Report any usual feelings of change in heart rate, dizziness, light headed feelings, weakness or not feeling well.  Reviewed recent cardiology visit, one  medication change. Gave all blood pressure logs to doctor and will start new log tomorrow.   Plan:  Follow up with cardiologist as scheduled on 12/25/23 completed and released for one year.  TOC week 5 follow up call next week.  Continue with plan of care.         Patient verbalizes understanding of instructions and care plan provided today and agrees to view in MyChart. Active MyChart status and patient understanding of how to access instructions and care plan via MyChart confirmed with patient.     Telephone follow up appointment with care management team member scheduled for:01/02/24   Please call the care guide team at [phone] if you need to cancel or reschedule your appointment.   Please call the Suicide and Crisis Lifeline: 988 call 1-800-273-TALK (toll free, 24 hour hotline) call 911 if you are experiencing a Mental Health or Behavioral Health Crisis or need someone to talk to.   Bing Edison MSN, RN RN Case Sales executive Health  VBCI-Population Health Office Hours M-F [phone] Direct Dial: [phone] Main Phone [phone]  Fax: [phone] [email or web address]

## 2023-12-26 NOTE — Transitions of Care (Post Inpatient/ED Visit) (Signed)
 Transition of Care week 4  Visit Note  12/26/2023  Name: Andrew Vance MRN: [id number]          DOB: Jun 08, 1935  Situation: Patient enrolled in [facility] 30-day program. Visit completed with patient by telephone.   Background:   Initial Transition Care Management Follow-up Telephone Call    Past Medical History:  Diagnosis Date   AICD (automatic cardioverter/defibrillator) present    Anemia, iron deficiency    hx   Arthritis    all over   Atrial fibrillation [facility])    sees Dr. 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 Elsie Chol    Tachycardia-bradycardia syndrome [facility])     Assessment: Patient Reported Symptoms: Cognitive Cognitive Status: No symptoms reported, Alert and oriented to person, place, and time, Insightful and able to interpret abstract concepts, Normal speech and language skills      Neurological Neurological Review of Symptoms: No symptoms reported Neurological Self-Management Outcome: 4 (good)  HEENT HEENT Symptoms Reported: No symptoms reported      Cardiovascular Cardiovascular Symptoms Reported: No symptoms reported Does patient have uncontrolled Hypertension?: No Cardiovascular Management Strategies: Medical device, Medication therapy, Routine screening, Weight management, Activity, Coping strategies Do You Have a Working Readable Scale?: Yes Cardiovascular Self-Management Outcome: 5 (very good) Cardiovascular Comment: Patient has follow up with Dr Waddell yesterday and gave him all blood pressure and weight logs and was released for 1 year till next follow up. No lightheadedness, chest pain, shortness of breath, drove for the first time today  with no issues.  Respiratory Respiratory Symptoms Reported: No symptoms reported Respiratory Self-Management Outcome: 5 (very good)  Endocrine Endocrine Symptoms Reported: No symptoms reported Is patient diabetic?: No    Gastrointestinal Gastrointestinal Symptoms Reported: No symptoms reported      Genitourinary Genitourinary Symptoms Reported: No symptoms reported    Integumentary Additional Integumentary Details: No changes from before, had dermatology appointment scheduled for follow up on psoriasis condition. Skin Management Strategies: Coping strategies, Routine screening, Medication therapy Skin Self-Management Outcome: 5 (very good) Skin Comment: Sees Dermaltogly twice a year.  Musculoskeletal Musculoskelatal Symptoms Reviewed: Joint pain Other Musculoskeletal Symptoms: Patient stated he has psoriatic arthritis since mied 20's, had bilateral hip replacement years ago with good results, but has issues in hands and knuckes. Additional Musculoskeletal Details: No change from before Musculoskeletal Management Strategies: Activity, Exercise, Coping strategies, Medication therapy, Routine screening Musculoskeletal Self-Management Outcome: 5 (very good) Falls in the past year?: No Patient at Risk for Falls Due to: Orthopedic patient Fall risk Follow up: Falls prevention discussed  Psychosocial Psychosocial Symptoms Reported: No symptoms reported         There were no vitals filed for this visit.  Medications Reviewed Today     Reviewed by Carolee Heron NOVAK, RN (Case Manager) on 12/26/23 at 1525  Med List Status: <None>   Medication Order Taking? Sig Documenting Provider Last Dose Status Informant  acetaminophen  (TYLENOL ) 500 MG tablet [id number] Yes Take 1,000 mg by mouth every 6 (six) hours as needed for moderate pain (pain score 4-6). [provider]  Active Self, Pharmacy Records  amiodarone  (PACERONE ) 200 MG tablet [id number] Yes Take 1 tablet (200 mg total) by mouth  daily. Waddell Danelle ORN, MD  Active   amoxicillin  (AMOXIL ) 500 MG capsule [id number] Yes Take 2,000 mg by mouth See admin instructions. Take 2000 mg 1 hour prior to dental work [provider]  Active Self, Pharmacy Records           Med Note SYDELL, Rincon B   Thu Dec 19, 2023  2:17 PM) As prescribed for any dental work only.  apixaban  (ELIQUIS ) 5 MG TABS tablet [id number] Yes Take 1 tablet (5 mg total) by mouth 2 (two) times daily. Lesia Ozell Barter, PA-C  Active   carvedilol  (COREG ) 6.25 MG tablet [id number] Yes Take 1.5 tablets (9.375 mg total) by mouth 2 (two) times daily with a meal. Waddell Danelle ORN, MD  Active   digoxin  (LANOXIN ) 0.125 MG tablet [id number] Yes TAKE 1 TABLET BY MOUTH EVERY DAY  Patient taking differently: Take 125 mcg by mouth at bedtime.   Waddell Danelle ORN, MD  Active Self, Pharmacy Records  fluocinonide (LIDEX) 0.05 % external solution [id number] Yes Apply 1 application  topically 2 (two) times daily.  Patient taking differently: Apply 1 application  topically 2 (two) times daily. Use as needed   [provider]  Active Self, Pharmacy Records  hydrocortisone 2.5 % lotion [id number] Yes Apply 1 Application topically 2 (two) times daily.  Patient taking differently: Apply 1 Application topically 2 (two) times daily. As needed per patient stated.   [provider]  Active Self, Pharmacy Records  Lutein 20 MG CAPS [id number] Yes Take 20 mg by mouth at bedtime.  [provider]  Active Self, Pharmacy Records  rosuvastatin  (CRESTOR ) 10 MG tablet [id number] Yes Take 1 tablet (10 mg total) by mouth daily.  Patient taking differently: Take 10 mg by mouth at bedtime.   [name] LABOR, MD  Active Self, Pharmacy Records            Goals Addressed             This Visit's Progress    VBCI Transitions of Care [facility]) Care Plan   On track     Care Plan/Goals/Self Care Activities reviewed or updated with patient on 12/26/23.   Problems:   Recent Hospitalization for treatment of CHF and ventricular tachycardia, CAD, Permanent ICD(Abbot BiV ICD).  Knowledge Deficit Related to Chronic systolic heart failure, Ventricular Tachycardia, ICD device VT detection.   Goal:  Over the next 30 days, the patient will not experience hospital readmission  Interventions:  Transitions of Care: Discussed/allowed time if patient had any concerns or questions related to his discharge diagnosis or other conditions.  Patient stated he felt really good, has an occasionally 1-2 sec feeling of lightheadedness, but nothing concerning, and that he had a good understanding of his conditions, self care, and what to contact provider for.  Reviewed all discharge instructions, PCP HFU, self care activities, medications, allergies, SDOH review, functional ADL's, assessment of systems, anxiety and depression screening.  Patient taking and logging blood pressure, heart rate, and weights daily.  Heart rate per patient: 86 (set by ICD ventricular rate at 85).  No lower extremity edema/swelling.  No falls per patient.  No changes in medications, allergies, functional status, SDOH, depression score Annual Eye Exam Reviewed and patient has exams twice a year for history of glaucoma and cataracts, wear glasses. Health Screening reviewed with patient/caregiver: Patient has regular eye exams  as mentioned above. 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  November 2025 next follow up per patient.  Doctor Visits  - discussed the importance of doctor visits. Patient completed PCP HFU on 12/09/23: Completed, notes reviewed with patient. No medications changes noted, one clarified 12/12/23.  No changes on 12/19/23.  Cardiology EP with Dr. Waddell follow ups on 8/27.  Completed, released for 1 yr., one medication change noted.  12/18/2023 for DEFIB check completed post adjustments in hospitalization.  Pacemaker  download per patient that he does at home patient stated.  Completed.  CAD Interventions: Assessed understanding of CAD diagnosis. Medications reviewed including medications utilized in CAD treatment plan. Provided education on importance of blood pressure control in management of CAD. Reviewed Importance of taking all medications as prescribed. Reviewed Importance of attending all scheduled provider appointments. Screening for signs and symptoms of depression related to chronic disease state. Denies shortness of breath, chest pain, any edema in lower extremities, weight stable at 168 lbs. Discussed/Reviewed reason for hospitalization, changes made to ICD:  ICD Heart rate now set to not be allowed to go below 85-86 per patient stated.   Heart rate today reported by patient to be 86.  Denies any significant dizziness, chest pain, shortness of breath or any other issues on review of systems assessment.  Denies previous dizziness or lightheadedness this week.  Cleared to drive and drove today without any issues.  Encourage to report to provider and call if worsens.  Patient stated he is feeling good, no problems, no reactions to new medications.   Heart Failure Interventions: Assessed need for readable accurate scales in home.  Patient has scales and reports weighing daily.  Provided education about placing scale on hard, flat surface Advised patient to weigh each morning after emptying bladder Discussed importance of daily weight and advised patient to weigh and record daily. Weight patient reported: 168 lbs today as well as last week.  Reviewed discharge heart failure instructions with patient, and what to call provider for,  especially noting the weight gain instructions/parameters to call provider for.  Discussed diet and that spouse cooks for patient taking these instructions in to account.  Discussed the importance of keeping all appointments with provider Screening for signs and  symptoms of depression related to chronic disease state. Patient denies any symptoms and stated he is doing good with no problems.   Denies shortness of breath, chest pain, any edema in lower extremities, weight stable at 168 lbs. Assessed social determinant of health barriers  No change as of 12/26/23.   Patient Self Care Activities: Reviewed with patient 12/26/23.  Attend all scheduled provider appointments Call pharmacy for medication refills 3-7 days in advance of running out of medications Call provider office for new concerns or questions  Notify RN Care Manager of TOC call rescheduling needs Participate in Transition of Care Program/Attend TOC scheduled calls Perform all self care activities independently  Take medications as prescribed   call office if I gain more than 2 pounds in one day or 5 pounds in one week track weight in diary use salt in moderation weigh myself daily know when to call the doctor:Reviewed discharge heart failure instructions and what to contact provider for, patient verbalized good understanding of self care management  Report any falls and/or bumps to the head to provider or seek emergency help due to being on blood thinners. Patient verbalized understanding and that he was well versed  in those  precautions   Report any usual feelings of change in heart rate, dizziness, light headed feelings, weakness or not feeling well.  Reviewed recent cardiology visit, one medication change. Gave all blood pressure logs to doctor and will start new log tomorrow.   Plan:  Follow up with cardiologist as scheduled on 12/25/23 completed and released for one year.  TOC week 5 follow up call next week.  Continue with plan of care.         Recommendation:   Continue Current Plan of Care  Follow Up Plan:   Telephone follow-up in 1 week   Bing Edison MSN, RN RN Case Manager [facility]  VBCI-Population Health Office Hours M-F [phone] Direct Dial:  [phone] Main Phone [phone]  Fax: [phone] [email or web address]

## 2023-12-28 ENCOUNTER — Encounter: Payer: Self-pay | Admitting: Internal Medicine

## 2023-12-31 ENCOUNTER — Other Ambulatory Visit: Payer: Self-pay

## 2023-12-31 MED ORDER — AMIODARONE HCL 200 MG PO TABS: 200.0000 mg | ORAL_TABLET | Freq: Every day | ORAL | 6 refills | Status: AC

## 2023-12-31 MED ORDER — APIXABAN 5 MG PO TABS: 5.0000 mg | ORAL_TABLET | Freq: Two times a day (BID) | ORAL | 6 refills | Status: AC

## 2024-01-01 ENCOUNTER — Ambulatory Visit

## 2024-01-02 ENCOUNTER — Other Ambulatory Visit: Payer: Self-pay

## 2024-01-02 NOTE — Patient Instructions (Signed)
 Visit Information  Thank you for taking time to visit with me today. Please don't hesitate to contact me if I can be of assistance to you before our next scheduled telephone appointment.    Following is a copy of your care plan:   Goals Addressed             This Visit's Progress    VBCI Transitions of Care (TOC) Care Plan   On track     Care Plan/Goals/Self Care Activities reviewed or updated with patient on 01/19/24.   Problems:  Recent Hospitalization for treatment of CHF and ventricular tachycardia, CAD, Permanent ICD(Abbot BiV ICD).  Knowledge Deficit Related to Chronic systolic heart failure, Ventricular Tachycardia, ICD device VT detection.   Goal:  Over the next 30 days, the patient will not experience hospital readmission.  Goal will be met on 12/05/23.   Interventions:  Transitions of Care: Discussed/allowed time if patient had any concerns or questions related to his discharge diagnosis or other conditions.  Patient stated he felt really good, has an occasionally 1-2 sec feeling of lightheadedness, but nothing concerning, and that he had a good understanding of his conditions, self care, and what to contact provider for.  Reviewed all discharge instructions, PCP HFU, self care activities, medications, allergies, SDOH review, functional ADL's, assessment of systems, anxiety and depression screening.  Patient taking and logging blood pressure, heart rate, and weights daily.  Blood pressure today 123/80 and HR 87.  Heart rate per patient: 87 (set by ICD ventricular rate at 85).  No lower extremity edema/swelling.  No falls per patient.  Denies shortness of breath or chest pain/discomfort.  No changes in other systems review from last visit.  No changes in prescribed medications, allergies, functional status, assessment/review of systems, SDOH, depression score. Patient has questions related to medications prescribed on discharge and has reached out to provider, Dr.  Waddell.  On review with patient and reviewing provider/staff messages, patient was encouraged to reach out again asap as message did not specify patient was not taking certain medications, (digoxin  and carvedilol ), pending a reply.  Amiodarone  and Eliquis  were reconfirmed/ordered on 12/31/23 by Dr. Waddell.  Annual Eye Exam Reviewed and patient has exams twice a year for history of glaucoma and cataracts, wear glasses. Health Screening reviewed with patient/caregiver: Patient has regular eye exams as mentioned above. 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  November 2025 next follow up per patient.  Doctor Visits  - discussed the importance of doctor visits. Patient completed PCP HFU on 12/09/23: Completed, notes reviewed with patient.  No medications changes noted, one clarified 12/12/23.  No changes on 12/19/23.  Cardiology EP with Dr. Waddell follow ups on 8/27: Completed.  Completed, released for 1 yr., one medication change noted.  01/02/24: Medication review.  Has medication clarification/concern he has messaged provider about.  Encouraged/instructed/patient plans to reach out again today to clarify medications being held pending an answer via provider.  12/18/2023 for DEFIB check completed post adjustments in hospitalization.  Pacemaker download per patient that he does at home patient stated.  Completed.  CAD Interventions: Assessed understanding of CAD diagnosis. Medications reviewed including medications utilized in CAD treatment plan. Provided education on importance of blood pressure control in management of CAD. Reviewed Importance of taking all medications as prescribed. Reviewed Importance of attending all scheduled provider appointments. Screening for signs and symptoms of depression related to chronic disease state. Denies shortness of breath, chest pain,  any edema in lower extremities, weight stable at 167  lbs. Discussed/Reviewed reason for hospitalization, changes made to ICD:  ICD Heart rate now set to not be allowed to go below 85-86 per patient stated.   Heart rate today reported by patient to be 87.  Denies any significant dizziness, chest pain, shortness of breath or any other issues on review of systems assessment.  Denies previous dizziness or lightheadedness this week.  Cleared to drive. Encourage to report to provider and call if worsens.  Patient stated he is feeling good, no problems, no reactions to new medications.   Heart Failure Interventions: Assessed need for readable accurate scales in home.  Patient has scales and reports weighing daily.  Provided education about placing scale on hard, flat surface Advised patient to weigh each morning after emptying bladder Discussed importance of daily weight and advised patient to weigh and record daily. Weight patient reported: 167 lbs today as well as last week.  Reviewed discharge heart failure instructions with patient, and what to call provider for,  especially noting the weight gain instructions/parameters to call provider for.  Discussed diet and that spouse cooks for patient taking these instructions in to account.  Discussed the importance of keeping all appointments with provider. Patient has completed follow up appointments post discharge.  Screening for signs and symptoms of depression related to chronic disease state. Patient denies any symptoms and stated he is doing good with no problems.   Denies shortness of breath, chest pain, any edema in lower extremities, weight stable at 167 lbs. Assessed social determinant of health barriers  No change as of 01/02/24.  Patient Self Care Activities: Reviewed with patient 01/02/24.  Attend all scheduled provider appointments Call pharmacy for medication refills 3-7 days in advance of running out of medications Call provider office for new concerns or questions  Notify RN Care  Manager of TOC call rescheduling needs Participate in Transition of Care Program/Attend TOC scheduled calls Perform all self care activities independently  Take medications as prescribed   call office if I gain more than 2 pounds in one day or 5 pounds in one week track weight in diary use salt in moderation weigh myself daily know when to call the doctor:Reviewed discharge heart failure instructions and what to contact provider for, patient verbalized good understanding of self care management  Report any falls and/or bumps to the head to provider or seek emergency help due to being on blood thinners. Patient verbalized understanding and that he was well versed  in those precautions   Report any usual feelings of change in heart rate, dizziness, light headed feelings, weakness or not feeling well.  Reviewed recent cardiology visit, one medication change. Gave all blood pressure logs to doctor and will start new log tomorrow.  Follow up with provider on 01/02/24 regarding medication questions as soon as possible.   Plan:  Continue with plan of care and self care management activities going forward.  Contact EP Cardiologist to clarify medications discussed on this call.  Please reach out to [facility] RNCM with any non urgent/emergent needs or questions I can try to assist with.  The patient has been provided with contact information for the care management team and has been advised to call with any health-related questions or concerns.  The patient verbalized understanding with current POC.  The patient is directed to their insurance card regarding availability of benefits coverage.     Bing Edison MSN, RN RN Case Sales executive Health  VBCI-Population  Health Office Hours M-F [phone] Direct Dial: [phone] Main Phone [phone]  Fax: [phone] [email or web address]           Patient verbalizes understanding of instructions and care plan provided today and agrees to view in MyChart.  Active MyChart status and patient understanding of how to access instructions and care plan via MyChart confirmed with patient.      Please call the care guide team at [phone] if you need to schedule an appointment.   Please call the Suicide and Crisis Lifeline: 988 call 1-800-273-TALK (toll free, 24 hour hotline) call 911 if you are experiencing a Mental Health or Behavioral Health Crisis or need someone to talk to.   Bing Edison MSN, RN RN Case Sales executive Health  VBCI-Population Health Office Hours M-F [phone] Direct Dial: [phone] Main Phone [phone]  Fax: [phone] [email or web address]

## 2024-01-02 NOTE — Transitions of Care (Post Inpatient/ED Visit) (Signed)
 Transition of Care Week 5  Visit Note  01/02/2024  Name: Andrew Vance MRN: [id number]          DOB: 07/22/1935  Situation: Patient enrolled in [facility] 30-day program. Visit completed with patient by telephone.   Background:   Initial Transition Care Management Follow-up Telephone Call    Past Medical History:  Diagnosis Date   AICD (automatic cardioverter/defibrillator) present    Anemia, iron deficiency    hx   Arthritis    all over   Atrial fibrillation [facility])    sees Dr. 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 Elsie Chol    Tachycardia-bradycardia syndrome [facility])     Assessment: Patient Reported Symptoms: Cognitive Cognitive Status: No symptoms reported      Neurological Neurological Review of Symptoms: No symptoms reported    HEENT HEENT Symptoms Reported: No symptoms reported      Cardiovascular Cardiovascular Symptoms Reported: No symptoms reported Does patient have uncontrolled Hypertension?: No Cardiovascular Management Strategies: Medical device, Medication therapy, Routine screening, Weight management, Activity, Coping strategies Weight: 167 lb (75.8 kg) Cardiovascular Self-Management Outcome: 4 (good)  Respiratory Respiratory Symptoms Reported: No symptoms reported Additional Respiratory Details: Denies shortness of breath, chest pain or discomfort, or leg edema or swelling. Respiratory Management Strategies: Activity, Coping strategies, Medication therapy, Routine screening Respiratory Self-Management Outcome: 4 (good)  Endocrine Endocrine Symptoms Reported: No symptoms reported Is patient diabetic?: No    Gastrointestinal Gastrointestinal  Symptoms Reported: No symptoms reported      Genitourinary Genitourinary Symptoms Reported: No symptoms reported    Integumentary Integumentary Symptoms Reported: Other Other Integumentary Symptoms: Psoriasis long term Additional Integumentary Details: Dermatology follow up Novemeber 2025. Skin Management Strategies: Coping strategies, Routine screening, Medication therapy Skin Self-Management Outcome: 4 (good)  Musculoskeletal Musculoskelatal Symptoms Reviewed: Joint pain Musculoskeletal Management Strategies: Activity, Exercise, Coping strategies, Medication therapy, Routine screening Musculoskeletal Self-Management Outcome: 4 (good) Falls in the past year?: No Fall risk Follow up: Falls prevention discussed  Psychosocial Psychosocial Symptoms Reported: No symptoms reported         Vitals:   01/02/24 1525  BP: 123/80  Pulse: 87    Medications Reviewed Today     Reviewed by Carolee Heron NOVAK, RN (Case Manager) on 01/02/24 at 1505  Med List Status: <None>   Medication Order Taking? Sig Documenting Provider Last Dose Status Informant  acetaminophen  (TYLENOL ) 500 MG tablet [id number] Yes Take 1,000 mg by mouth every 6 (six) hours as needed for moderate pain (pain score 4-6). [provider]  Active Self, Pharmacy Records  amiodarone  (PACERONE ) 200 MG tablet [id number] Yes Take 1 tablet (200 mg total) by mouth daily. Waddell Danelle ORN, MD  Active   amoxicillin  (AMOXIL ) 500 MG capsule [id number] Yes Take 2,000 mg by mouth See admin instructions. Take 2000 mg 1 hour prior to dental work [provider]  Active Self, Pharmacy Records           Med Note SYDELL, [place] B   Thu Dec 19, 2023  2:17 PM) As prescribed for any dental work only.  apixaban  (ELIQUIS ) 5 MG TABS tablet [id number] Yes Take 1 tablet (5 mg total) by mouth 2 (two) times daily. Waddell Danelle ORN, MD  Active   carvedilol  (COREG ) 6.25 MG tablet [id number]  Take 1.5 tablets (9.375 mg total) by mouth 2 (two) times  daily with a meal.  Patient not taking: Reported on 01/02/2024   Waddell Danelle ORN, MD  Active            Med Note SYDELL, HERON KATHEE Schaumann Jan 02, 2024  3:04 PM) 01/02/24: Patient has been holding this medication pending some clarification from Dr. Waddell regarding questions concerning taking Digoxin  and Carvedilol  with Amiodarone . Patient to reach out to office today for further clarification related to this concern as message to provider did not specify this point of concern and Amiodarone  and Eliquis  were just reconfirmed on 12/31/23 by Dr. Waddell per office notes.     digoxin  (LANOXIN ) 0.125 MG tablet [id number]  TAKE 1 TABLET BY MOUTH EVERY DAY  Patient not taking: Reported on 01/02/2024   Waddell Danelle ORN, MD  Active Self, Pharmacy Records           Med Note SYDELL, HERON KATHEE Schaumann Jan 02, 2024  3:02 PM) 01/02/24: Patient has been holding this medication pending some clarification from Dr. Waddell regarding questions concerning taking Digoxin  and Carvedilol  with Amiodarone . Patient to reach out to office today for further clarification related to this concern as message to provider did not specify this  point of concern and Amiodarone  and Eliquis  were just reconfirmed on 12/31/23 by Dr. Waddell per office notes.    fluocinonide (LIDEX) 0.05 % external solution [id number] Yes Apply 1 application  topically 2 (two) times daily.  Patient taking differently: Apply 1 application  topically 2 (two) times daily. Use as needed   [provider]  Active Self, Pharmacy Records  hydrocortisone 2.5 % lotion [id number] Yes Apply 1 Application topically 2 (two) times daily.  Patient taking differently: Apply 1 Application topically 2 (two) times daily. As needed per patient stated.   [provider]  Active Self, Pharmacy Records  Lutein 20 MG CAPS [id number] Yes Take 20 mg by mouth at bedtime.  [provider]  Active Self, Pharmacy Records  rosuvastatin  (CRESTOR ) 10 MG tablet [id number] Yes Take 1 tablet  (10 mg total) by mouth daily.  Patient taking differently: Take 10 mg by mouth at bedtime.   [name] LABOR, MD  Active Self, Pharmacy Records  Med List Note SYDELL HERON KATHEE, RN 01/02/24 1505): 01/02/24: Patient has been holding Digoxin  and Carvedilol  pending some clarification from Dr. Waddell regarding questions concerning taking Digoxin  and Carvedilol  with Amiodarone . Patient to reach out to office today for further clarification related to this concern as message to provider did not specify this  point of concern and Amiodarone  and Eliquis  were just reconfirmed on 12/31/23 by Dr. Waddell per office notes.               Goals Addressed             This Visit's Progress    VBCI Transitions of Care (TOC) Care Plan   On track     Care Plan/Goals/Self Care Activities reviewed or updated with patient on 01/19/24.   Problems:  Recent Hospitalization for treatment of CHF and ventricular tachycardia, CAD, Permanent ICD(Abbot BiV ICD).  Knowledge Deficit Related to Chronic systolic heart failure, Ventricular Tachycardia, ICD device VT detection.   Goal:  Over the  next 30 days, the patient will not experience hospital readmission.  Goal will be met on 12/05/23.   Interventions:  Transitions of Care: Discussed/allowed time if patient had any concerns or questions related to his discharge diagnosis or other conditions.  Patient stated he felt really good, has an occasionally 1-2 sec feeling of lightheadedness, but nothing concerning, and that he had a good understanding of his conditions, self care, and what to contact provider for.  Reviewed all discharge instructions, PCP HFU, self care activities, medications, allergies, SDOH review, functional ADL's, assessment of systems, anxiety and depression screening.  Patient taking and logging blood pressure, heart rate, and weights daily.  Blood pressure today 123/80 and HR 87.  Heart rate per patient: 87 (set by ICD ventricular rate at 85).  No  lower extremity edema/swelling.  No falls per patient.  Denies shortness of breath or chest pain/discomfort.  No changes in other systems review from last visit.  No changes in prescribed medications, allergies, functional status, assessment/review of systems, SDOH, depression score. Patient has questions related to medications prescribed on discharge and has reached out to provider, Dr. Waddell.  On review with patient and reviewing provider/staff messages, patient was encouraged to reach out again asap as message did not specify patient was not taking certain medications, (digoxin  and carvedilol ), pending a reply.  Amiodarone  and Eliquis  were reconfirmed/ordered on 12/31/23 by Dr. Waddell.  Annual Eye Exam Reviewed and patient has exams twice a year for history of glaucoma and cataracts, wear glasses. Health Screening reviewed with patient/caregiver: Patient has regular eye exams as mentioned above. 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  November 2025 next follow up per patient.  Doctor Visits  - discussed the importance of doctor visits. Patient completed PCP HFU on 12/09/23: Completed, notes reviewed with patient.  No medications changes noted, one clarified 12/12/23.  No changes on 12/19/23.  Cardiology EP with Dr. Waddell follow ups on 8/27: Completed.  Completed, released for 1 yr., one medication change noted.  01/02/24: Medication review.  Has medication clarification/concern he has messaged provider about.  Encouraged/instructed/patient plans to reach out again today to clarify medications being held pending an answer via provider.  12/18/2023 for DEFIB check completed post adjustments in hospitalization.  Pacemaker download per patient that he does at home patient stated.  Completed.  CAD Interventions: Assessed understanding of CAD diagnosis. Medications reviewed including medications utilized in CAD treatment plan. Provided  education on importance of blood pressure control in management of CAD. Reviewed Importance of taking all medications as prescribed. Reviewed Importance of attending all scheduled provider appointments. Screening for signs and symptoms of depression related to chronic disease state. Denies shortness of breath, chest pain, any edema in lower extremities, weight stable at 167 lbs. Discussed/Reviewed reason for hospitalization, changes made to ICD:  ICD Heart rate now set to not be allowed to go below 85-86 per patient stated.   Heart rate today reported by patient to be 87.  Denies any significant dizziness, chest pain, shortness of breath or any other issues on review of systems assessment.  Denies previous dizziness or lightheadedness this week.  Cleared to drive. Encourage to report to provider and call if worsens.  Patient stated he is feeling good, no problems, no reactions to new medications.   Heart Failure Interventions: Assessed need for readable accurate scales in home.  Patient has scales and reports weighing daily.  Provided education about placing scale on hard, flat  surface Advised patient to weigh each morning after emptying bladder Discussed importance of daily weight and advised patient to weigh and record daily. Weight patient reported: 167 lbs today as well as last week.  Reviewed discharge heart failure instructions with patient, and what to call provider for,  especially noting the weight gain instructions/parameters to call provider for.  Discussed diet and that spouse cooks for patient taking these instructions in to account.  Discussed the importance of keeping all appointments with provider. Patient has completed follow up appointments post discharge.  Screening for signs and symptoms of depression related to chronic disease state. Patient denies any symptoms and stated he is doing good with no problems.   Denies shortness of breath, chest pain, any edema in lower  extremities, weight stable at 167 lbs. Assessed social determinant of health barriers  No change as of 01/02/24.  Patient Self Care Activities: Reviewed with patient 01/02/24.  Attend all scheduled provider appointments Call pharmacy for medication refills 3-7 days in advance of running out of medications Call provider office for new concerns or questions  Notify RN Care Manager of TOC call rescheduling needs Participate in Transition of Care Program/Attend TOC scheduled calls Perform all self care activities independently  Take medications as prescribed   call office if I gain more than 2 pounds in one day or 5 pounds in one week track weight in diary use salt in moderation weigh myself daily know when to call the doctor:Reviewed discharge heart failure instructions and what to contact provider for, patient verbalized good understanding of self care management  Report any falls and/or bumps to the head to provider or seek emergency help due to being on blood thinners. Patient verbalized understanding and that he was well versed  in those precautions   Report any usual feelings of change in heart rate, dizziness, light headed feelings, weakness or not feeling well.  Reviewed recent cardiology visit, one medication change. Gave all blood pressure logs to doctor and will start new log tomorrow.  Follow up with provider on 01/02/24 regarding medication questions as soon as possible.   Plan:  Continue with plan of care and self care management activities going forward.  Contact EP Cardiologist to clarify medications discussed on this call.  Please reach out to [facility] RNCM with any non urgent/emergent needs or questions I can try to assist with.  The patient has been provided with contact information for the care management team and has been advised to call with any health-related questions or concerns.  The patient verbalized understanding with current POC.  The patient is directed to their insurance  card regarding availability of benefits coverage.     Bing Edison MSN, RN RN Case Sales executive Health  [facility]-Population Health Office Hours M-F [phone] Direct Dial: [phone] Main Phone [phone]  Fax: [phone] [email or web address]           Plan:  Continue with plan of care and self care management activities going forward.  Contact EP Cardiologist to clarify medications discussed on this call.  Please reach out to [facility] RNCM with any non urgent/emergent needs or questions I can try to assist with.  The patient has been provided with contact information for the care management team and has been advised to call with any health-related questions or concerns.  The patient verbalized understanding with current POC.  The patient is directed to their insurance card regarding availability of benefits coverage.    Recommendation:   Continue Current Plan of Care  Follow Up Plan:   Patient has met all care management goals. Care Management case will be closed. Patient has been provided contact information should new needs arise.    Bing Edison MSN, RN RN Case Sales executive Health  [facility]Population Health Office Hours M-F [phone] Direct Dial: [phone] Main Phone [phone]  Fax: [phone] .com

## 2024-01-09 ENCOUNTER — Encounter: Payer: Self-pay | Admitting: Family Medicine

## 2024-01-09 MED ORDER — COVID-19 MRNA VAC-TRIS(PFIZER) 30 MCG/0.3ML IM SUSY: 0.3000 mL | PREFILLED_SYRINGE | Freq: Once | INTRAMUSCULAR | 0 refills | Status: AC

## 2024-01-09 NOTE — Telephone Encounter (Signed)
 Done

## 2024-01-13 ENCOUNTER — Encounter: Payer: Self-pay | Admitting: Family Medicine

## 2024-01-14 DIAGNOSIS — Z961 Presence of intraocular lens: Secondary | ICD-10-CM | POA: Diagnosis not present

## 2024-01-14 DIAGNOSIS — H401132 Primary open-angle glaucoma, bilateral, moderate stage: Secondary | ICD-10-CM | POA: Diagnosis not present

## 2024-01-15 ENCOUNTER — Ambulatory Visit
Admission: EM | Admit: 2024-01-15 | Discharge: 2024-01-15 | Disposition: A | Discharge status: Home / Self Care | Attending: Family Medicine | Admitting: Family Medicine

## 2024-01-15 DIAGNOSIS — U071 COVID-19: Secondary | ICD-10-CM

## 2024-01-15 LAB — POC SARS CORONAVIRUS 2 AG -  ED: SARS Coronavirus 2 Ag: POSITIVE — AB

## 2024-01-15 MED ORDER — BENZONATATE 100 MG PO CAPS: ORAL_CAPSULE | ORAL | 0 refills | Status: AC

## 2024-01-15 NOTE — ED Provider Notes (Signed)
 [facility]   [id number] 01/15/24 Arrival Time: 9148  ASSESSMENT & PLAN:  1. COVID-19 virus infection    Paxlovid contraindicated with his current medications. Overall he looks great. Very mild symptoms.  Discussed typical duration of COVID. Results for orders placed or performed during the hospital encounter of 01/15/24  POC SARS Coronavirus 2 Ag-ED - Nasal Swab   Collection Time: 01/15/24  9:00 AM  Result Value Ref Range   SARS Coronavirus 2 Ag Positive (A) Negative   OTC symptom care as needed.  Meds ordered this encounter  Medications   benzonatate  (TESSALON ) 100 MG capsule    Sig: Take 1 capsule by mouth every 8 (eight) hours for cough.    Dispense:  21 capsule    Refill:  0     Follow-up Information     Schedule an appointment as soon as possible for a visit  with [name] LABOR, MD.   Specialty: Family Medicine Why: For follow up. Contact information: [address] [place] [address] [phone]                 Reviewed expectations re: course of current medical issues. Questions answered. Outlined signs and symptoms indicating need for more acute intervention. Understanding verbalized. After Visit Summary given.   SUBJECTIVE: History from: Patient. Andrew Vance is a 88 y.o. male. Patient reports having wife in ED for cold/covid19 symptoms on Sunday who was + for COVID19. His symptoms started this morning with sore throat, headache, cough, legs aching, ? Fever. Denies: fever and difficulty breathing. Normal PO intake without n/v/d.  OBJECTIVE:  Vitals:   01/15/24 0857 01/15/24 0859  BP:  118/78  Pulse:  88  Resp:  18  Temp:  99.6 F (37.6 C)  TempSrc:  Oral  SpO2:  96%  Weight: 78.8 kg   Height: 5' 8 (1.727 m)     General appearance: alert; no distress Eyes: PERRLA; EOMI; conjunctiva normal HENT: [place]; AT; with nasal congestion Neck: supple  Lungs: speaks full sentences without difficulty; unlabored;  CTAB Extremities: no edema Skin: warm and dry Neurologic: normal gait Psychological: alert and cooperative; normal mood and affect  Labs: Results for orders placed or performed during the hospital encounter of 01/15/24  POC SARS Coronavirus 2 Ag-ED - Nasal Swab   Collection Time: 01/15/24  9:00 AM  Result Value Ref Range   SARS Coronavirus 2 Ag Positive (A) Negative   Labs Reviewed  POC SARS CORONAVIRUS 2 AG -  ED - Abnormal; Notable for the following components:      Result Value   SARS Coronavirus 2 Ag Positive (*)    All other components within normal limits    Imaging: No results found.  Allergies  Allergen Reactions   Zithromax  [Azithromycin ] Other (See Comments)    Affects Coumadin  levels     Past Medical History:  Diagnosis Date   AICD (automatic cardioverter/defibrillator) present    Anemia, iron deficiency    hx   Arthritis    all over   Atrial fibrillation [facility])    sees Dr. Elspeth Sage   Bronchial pneumonia X 3 when I was a kid   Cataract    Complication of anesthesia 1976   BP elevated with spinal;  I was still in the service when that happened   GERD (gastroesophageal reflux disease)    GI bleed    hx   Glaucoma    H/O hiatal hernia    Hyperlipidemia  Hypertension    Psoriatic arthritis (HCC)    sees Dr. Elsie Chol    Tachycardia-bradycardia syndrome [facility])    Social History   Socioeconomic History   Marital status: Married    Spouse name: Not on file   Number of children: Not on file   Years of education: Not on file   Highest education level: 12th grade  Occupational History   Not on file  Tobacco Use   Smoking status: Former    Current packs/day: 0.00    Average packs/day: 1 pack/day for 30.0 years (30.0 ttl pk-yrs)    Types: Cigarettes, Pipe    Start date: 02/18/1952    Quit date: 02/17/1982    Years since quitting: 41.9   Smokeless tobacco: Former    Quit date: 05/01/1983  Vaping Use   Vaping status: Never Used   Substance and Sexual Activity   Alcohol  use: Yes    Alcohol /week: 3.0 standard drinks of alcohol     Types: 2 Shots of liquor, 1 Standard drinks or equivalent per week   Drug use: No   Sexual activity: Not Currently  Other Topics Concern   Not on file  Social History Narrative   Not on file   Social Drivers of Health   Financial Resource Strain: Low Risk  (12/05/2023)   Overall Financial Resource Strain (CARDIA)    Difficulty of Paying Living Expenses: Not hard at all  Food Insecurity: No Food Insecurity (01/02/2024)   Hunger Vital Sign    Worried About Running Out of Food in the Last Year: Never true    Ran Out of Food in the Last Year: Never true  Transportation Needs: No Transportation Needs (01/02/2024)   PRAPARE - Administrator, Civil Service (Medical): No    Lack of Transportation (Non-Medical): No  Physical Activity: Insufficiently Active (12/05/2023)   Exercise Vital Sign    Days of Exercise per Week: 1 day    Minutes of Exercise per Session: 30 min  Stress: No Stress Concern Present (12/05/2023)   [employer] of Occupational Health - Occupational Stress Questionnaire    Feeling of Stress: Not at all  Social Connections: Moderately Isolated (01/02/2024)   Social Connection and Isolation Panel    Frequency of Communication with Friends and Family: Twice a week    Frequency of Social Gatherings with Friends and Family: Once a week    Attends Religious Services: Never    Database administrator or Organizations: No    Attends Banker Meetings: Never    Marital Status: Married  Catering manager Violence: Not At Risk (01/02/2024)   Humiliation, Afraid, Rape, and Kick questionnaire    Fear of Current or Ex-Partner: No    Emotionally Abused: No    Physically Abused: No    Sexually Abused: No   Family History  Problem Relation Age of Onset   Hypertension Other    Heart disease Other    Varicose Veins Mother    Heart disease Father    Hearing loss  Maternal Grandmother    Heart disease Maternal Grandfather    Diabetes Paternal Grandmother    Cancer Paternal Grandfather    Past Surgical History:  Procedure Laterality Date   APPENDECTOMY     BIV ICD GENERATOR CHANGEOUT N/A 09/17/2023   Procedure: BIV ICD GENERATOR CHANGEOUT;  Surgeon: Waddell Danelle ORN, MD;  Location: [facility] INVASIVE CV LAB;  Service: Cardiovascular;  Laterality: N/A;   CARDIAC CATHETERIZATION  09/10/2008   CARDIAC  DEFIBRILLATOR PLACEMENT  11/18/2014   CATARACT EXTRACTION, BILATERAL  2020   at [facility]    COLONOSCOPY  05/27/2010   per Dr, Teressa, extensive diverticulosis, repeat in 10 yrs    EP IMPLANTABLE DEVICE N/A 11/18/2014   Procedure: BiV ICD Upgrade;  Surgeon: Danelle LELON Birmingham, MD;  Location: [facility] INVASIVE CV LAB;  Service: Cardiovascular;  Laterality: N/A;   ESOPHAGOGASTRODUODENOSCOPY  05/27/2010   per Dr. Teressa, normal    EYE SURGERY     GLAUCOMA SURGERY Bilateral 2020   at [facility]    HAND SURGERY Left 1978   lft hand, fusions to DIPs and PIPs of fingers 1,2,4,and 5   INSERT / REPLACE / REMOVE PACEMAKER  2010   JOINT REPLACEMENT     PILONIDAL CYST EXCISION  1961   TOTAL HIP ARTHROPLASTY Bilateral 1998 `2001   bilateral, sees Dr. [name]    TOTAL HIP REVISION  02/25/2012   Procedure: TOTAL HIP REVISION;  Surgeon: Dempsey JINNY Sensor, MD;  Location: [facility] OR;  Service: Orthopedics;  Laterality: Left;     Rolinda Rogue, MD 01/15/24 1024

## 2024-01-15 NOTE — ED Triage Notes (Signed)
 Patient reports having wife in ED for cold/covid19 symptoms on Sunday who was + for COVID19. His symptoms started this morning with sore throat, headache, cough, legs aching, ? Fever.

## 2024-01-22 NOTE — Progress Notes (Signed)
Remote ICD Transmission.

## 2024-02-11 DIAGNOSIS — L820 Inflamed seborrheic keratosis: Secondary | ICD-10-CM | POA: Diagnosis not present

## 2024-02-11 DIAGNOSIS — Z85828 Personal history of other malignant neoplasm of skin: Secondary | ICD-10-CM | POA: Diagnosis not present

## 2024-02-11 DIAGNOSIS — D485 Neoplasm of uncertain behavior of skin: Secondary | ICD-10-CM | POA: Diagnosis not present

## 2024-02-11 DIAGNOSIS — C44329 Squamous cell carcinoma of skin of other parts of face: Secondary | ICD-10-CM | POA: Diagnosis not present

## 2024-02-11 DIAGNOSIS — L570 Actinic keratosis: Secondary | ICD-10-CM | POA: Diagnosis not present

## 2024-02-11 DIAGNOSIS — L821 Other seborrheic keratosis: Secondary | ICD-10-CM | POA: Diagnosis not present

## 2024-02-11 DIAGNOSIS — D1801 Hemangioma of skin and subcutaneous tissue: Secondary | ICD-10-CM | POA: Diagnosis not present

## 2024-02-11 DIAGNOSIS — D692 Other nonthrombocytopenic purpura: Secondary | ICD-10-CM | POA: Diagnosis not present

## 2024-03-02 ENCOUNTER — Ambulatory Visit: Payer: PPO

## 2024-03-18 ENCOUNTER — Ambulatory Visit

## 2024-03-18 DIAGNOSIS — I4891 Unspecified atrial fibrillation: Secondary | ICD-10-CM | POA: Diagnosis not present

## 2024-03-19 ENCOUNTER — Ambulatory Visit: Payer: Self-pay | Admitting: Internal Medicine

## 2024-03-19 LAB — CUP PACEART REMOTE DEVICE CHECK
Battery Remaining Longevity: 79 mo
Battery Remaining Percentage: 88 %
Battery Voltage: 3.01 V
Date Time Interrogation Session: 20251119053002
HighPow Impedance: 50 Ohm
Implantable Lead Connection Status: 753985
Implantable Lead Connection Status: 753985
Implantable Lead Connection Status: 753985
Implantable Lead Implant Date: 20100521
Implantable Lead Implant Date: 20160721
Implantable Lead Implant Date: 20160721
Implantable Lead Location: 753858
Implantable Lead Location: 753859
Implantable Lead Location: 753860
Implantable Lead Model: 7122
Implantable Pulse Generator Implant Date: 20250520
Lead Channel Impedance Value: 430 Ohm
Lead Channel Impedance Value: 630 Ohm
Lead Channel Pacing Threshold Amplitude: 0.75 V
Lead Channel Pacing Threshold Amplitude: 1.5 V
Lead Channel Pacing Threshold Pulse Width: 0.5 ms
Lead Channel Pacing Threshold Pulse Width: 0.5 ms
Lead Channel Sensing Intrinsic Amplitude: 6.1 mV
Lead Channel Setting Pacing Amplitude: 1.75 V
Lead Channel Setting Pacing Amplitude: 2.5 V
Lead Channel Setting Pacing Pulse Width: 0.5 ms
Lead Channel Setting Pacing Pulse Width: 0.5 ms
Lead Channel Setting Sensing Sensitivity: 0.3 mV
Pulse Gen Serial Number: 111072965

## 2024-03-20 NOTE — Progress Notes (Signed)
 Remote ICD Transmission

## 2024-03-23 ENCOUNTER — Other Ambulatory Visit: Payer: Self-pay | Admitting: Family Medicine

## 2024-05-12 ENCOUNTER — Other Ambulatory Visit: Payer: Self-pay

## 2024-06-01 ENCOUNTER — Ambulatory Visit: Payer: PPO

## 2024-06-17 ENCOUNTER — Encounter

## 2024-08-31 ENCOUNTER — Ambulatory Visit: Payer: PPO

## 2024-09-16 ENCOUNTER — Encounter

## 2024-11-30 ENCOUNTER — Ambulatory Visit: Payer: PPO

## 2024-12-16 ENCOUNTER — Encounter

## 2025-03-01 ENCOUNTER — Ambulatory Visit: Payer: PPO

## 2025-03-17 ENCOUNTER — Encounter

## 2025-05-31 ENCOUNTER — Ambulatory Visit: Payer: PPO

## 2025-06-16 ENCOUNTER — Encounter

## 2025-09-15 ENCOUNTER — Encounter
